# Patient Record
Sex: Female | Born: 1960
Health system: Southern US, Community
[De-identification: ages and names within clinical notes are randomized; demographics above are authoritative.]

## PROBLEM LIST (undated history)

## (undated) DIAGNOSIS — M797 Fibromyalgia: Secondary | ICD-10-CM

## (undated) DIAGNOSIS — K219 Gastro-esophageal reflux disease without esophagitis: Secondary | ICD-10-CM

## (undated) DIAGNOSIS — E039 Hypothyroidism, unspecified: Secondary | ICD-10-CM

## (undated) DIAGNOSIS — M1990 Unspecified osteoarthritis, unspecified site: Secondary | ICD-10-CM

## (undated) DIAGNOSIS — I493 Ventricular premature depolarization: Secondary | ICD-10-CM

## (undated) DIAGNOSIS — I341 Nonrheumatic mitral (valve) prolapse: Secondary | ICD-10-CM

## (undated) DIAGNOSIS — K2900 Acute gastritis without bleeding: Secondary | ICD-10-CM

## (undated) DIAGNOSIS — I7789 Other specified disorders of arteries and arterioles: Secondary | ICD-10-CM

## (undated) DIAGNOSIS — I10 Essential (primary) hypertension: Secondary | ICD-10-CM

## (undated) DIAGNOSIS — E785 Hyperlipidemia, unspecified: Secondary | ICD-10-CM

## (undated) DIAGNOSIS — J309 Allergic rhinitis, unspecified: Secondary | ICD-10-CM

## (undated) DIAGNOSIS — M3500 Sicca syndrome, unspecified: Secondary | ICD-10-CM

## (undated) DIAGNOSIS — D122 Benign neoplasm of ascending colon: Secondary | ICD-10-CM

## (undated) DIAGNOSIS — G43909 Migraine, unspecified, not intractable, without status migrainosus: Secondary | ICD-10-CM

## (undated) DIAGNOSIS — D120 Benign neoplasm of cecum: Secondary | ICD-10-CM

## (undated) DIAGNOSIS — R7303 Prediabetes: Secondary | ICD-10-CM

## (undated) HISTORY — DX: Benign neoplasm of cecum: D12.0

## (undated) HISTORY — DX: Allergic rhinitis, unspecified: J30.9

## (undated) HISTORY — PX: NEUROMA SURGERY: SHX722

## (undated) HISTORY — DX: Acute gastritis without bleeding: K29.00

## (undated) HISTORY — DX: Gastro-esophageal reflux disease without esophagitis: K21.9

## (undated) HISTORY — DX: Fibromyalgia: M79.7

## (undated) HISTORY — PX: TONSILLECTOMY: SUR1361

## (undated) HISTORY — PX: PLACEMENT OF BREAST IMPLANTS: SHX6334

## (undated) HISTORY — PX: NASAL SINUS SURGERY: SHX719

## (undated) HISTORY — DX: Benign neoplasm of ascending colon: D12.2

## (undated) HISTORY — DX: Hyperlipidemia, unspecified: E78.5

## (undated) HISTORY — DX: Unspecified osteoarthritis, unspecified site: M19.90

## (undated) HISTORY — DX: Essential (primary) hypertension: I10

## (undated) HISTORY — PX: FOOT SURGERY: SHX648

## (undated) HISTORY — DX: Sjogren syndrome, unspecified: M35.00

## (undated) HISTORY — DX: Hypothyroidism, unspecified: E03.9

## (undated) HISTORY — PX: GANGLION CYST EXCISION: SHX1691

---

## 1985-05-27 HISTORY — PX: AUGMENTATION MAMMAPLASTY: SUR837

## 1994-05-27 HISTORY — PX: PARTIAL HYSTERECTOMY: SHX80

## 1994-05-27 HISTORY — PX: TOTAL VAGINAL HYSTERECTOMY: SHX2548

## 2012-05-27 HISTORY — PX: COLONOSCOPY: SHX5424

## 2014-06-27 HISTORY — PX: REPLACEMENT TOTAL KNEE: SUR1224

## 2015-08-02 ENCOUNTER — Ambulatory Visit (INDEPENDENT_AMBULATORY_CARE_PROVIDER_SITE_OTHER): Payer: Medicare Other | Admitting: Internal Medicine

## 2015-08-02 ENCOUNTER — Encounter: Payer: Self-pay | Admitting: Internal Medicine

## 2015-08-02 VITALS — BP 112/78 | HR 80 | Ht 65.0 in | Wt 176.0 lb

## 2015-08-02 DIAGNOSIS — Z8742 Personal history of other diseases of the female genital tract: Secondary | ICD-10-CM | POA: Diagnosis not present

## 2015-08-02 DIAGNOSIS — J3089 Other allergic rhinitis: Secondary | ICD-10-CM | POA: Insufficient documentation

## 2015-08-02 DIAGNOSIS — M8580 Other specified disorders of bone density and structure, unspecified site: Secondary | ICD-10-CM | POA: Diagnosis not present

## 2015-08-02 DIAGNOSIS — I10 Essential (primary) hypertension: Secondary | ICD-10-CM | POA: Insufficient documentation

## 2015-08-02 DIAGNOSIS — E782 Mixed hyperlipidemia: Secondary | ICD-10-CM | POA: Insufficient documentation

## 2015-08-02 DIAGNOSIS — M1712 Unilateral primary osteoarthritis, left knee: Secondary | ICD-10-CM | POA: Insufficient documentation

## 2015-08-02 DIAGNOSIS — M797 Fibromyalgia: Secondary | ICD-10-CM | POA: Insufficient documentation

## 2015-08-02 DIAGNOSIS — M170 Bilateral primary osteoarthritis of knee: Secondary | ICD-10-CM

## 2015-08-02 DIAGNOSIS — K279 Peptic ulcer, site unspecified, unspecified as acute or chronic, without hemorrhage or perforation: Secondary | ICD-10-CM | POA: Diagnosis not present

## 2015-08-02 DIAGNOSIS — E034 Atrophy of thyroid (acquired): Secondary | ICD-10-CM | POA: Diagnosis not present

## 2015-08-02 DIAGNOSIS — L709 Acne, unspecified: Secondary | ICD-10-CM | POA: Diagnosis not present

## 2015-08-02 DIAGNOSIS — E038 Other specified hypothyroidism: Secondary | ICD-10-CM | POA: Diagnosis not present

## 2015-08-02 DIAGNOSIS — M3500 Sicca syndrome, unspecified: Secondary | ICD-10-CM | POA: Insufficient documentation

## 2015-08-02 HISTORY — DX: Personal history of other diseases of the female genital tract: Z87.42

## 2015-08-02 NOTE — Progress Notes (Signed)
Date:  08/02/2015   Name:  Ashley Glover   DOB:  1960-07-15   MRN:  [id number]  New patient who recently moved here from [place].  Needs to establish with PCP and be referred to Orthopedics.  Chief Complaint: New Patient (Initial Visit) and Hypertension Hypertension This is a chronic problem. The current episode started more than 1 year ago. The problem is unchanged. The problem is controlled. Associated symptoms include chest pain (chest wall pain). Pertinent negatives include no headaches, palpitations or shortness of breath. Past treatments include beta blockers and diuretics. The current treatment provides significant improvement. Hypertensive end-organ damage includes a thyroid problem.  Thyroid Problem Presents for follow-up visit. Patient reports no anxiety, cold intolerance, constipation, diarrhea, fatigue or palpitations. The symptoms have been stable. Past treatments include levothyroxine. Her past medical history is significant for hyperlipidemia.  Hyperlipidemia This is a chronic problem. This is a new diagnosis. The problem is controlled. Associated symptoms include chest pain (chest wall pain) and myalgias. Pertinent negatives include no shortness of breath. Current antihyperlipidemic treatment includes statins. The current treatment provides significant improvement of lipids. There are no compliance problems.   Fibromyalgia -This is a long-standing diagnosis.  Been seen by rheumatology and orthopedics in the past. Currently she takes tramadol for severe pain, Advil for more minor pain and gabapentin daily. She does exercise some with the recumbent bike but can't do more strenuous exercise due to her significant degenerative arthritis.  Degenerative OA and hypermobility - seen regularly by Orthopedics in [place].  She has had one knee replacement that needs to be revised and the other needs surgery as well. Currently takes hydrocodone as needed only. She is reluctant to have further surgery but  does need to establish with orthopedics. She brings extensive records from her previous ortho.  Peptic ulcer disease -  remote history of peptic ulcer. Maintained on daily PPI. She generally avoids nonsteroidals as much as possible. No recent symptoms to suggest a flare. No nausea vomiting or hematemesis.  Review of Systems  Constitutional: Negative for fever, chills, fatigue and unexpected weight change.  HENT: Positive for postnasal drip. Negative for sinus pressure and trouble swallowing.   Eyes: Negative for visual disturbance.  Respiratory: Negative for cough, chest tightness and shortness of breath.   Cardiovascular: Positive for chest pain (chest wall pain). Negative for palpitations and leg swelling.  Gastrointestinal: Negative for abdominal pain, diarrhea, constipation and blood in stool.  Endocrine: Negative for cold intolerance.  Genitourinary: Negative for dysuria and pelvic pain.  Musculoskeletal: Positive for myalgias, joint swelling, arthralgias and gait problem.  Skin: Positive for rash (initermittent acne lesions).  Neurological: Negative for syncope, weakness and headaches.  Psychiatric/Behavioral: Negative for sleep disturbance and dysphoric mood. The patient is not nervous/anxious.     Patient Active Problem List   Diagnosis Date Noted  . Essential hypertension 08/02/2015  . Hypothyroidism due to acquired atrophy of thyroid 08/02/2015  . Fibromyalgia 08/02/2015  . Sjogren's syndrome ([place]) 08/02/2015  . Peptic ulcer disease 08/02/2015  . Acne 08/02/2015  . Primary osteoarthritis of both knees 08/02/2015  . Osteopenia 08/02/2015  . Other allergic rhinitis 08/02/2015  . Hyperlipidemia, mixed 08/02/2015    Prior to Admission medications   Medication Sig Start Date End Date Taking? Authorizing Provider  furosemide (LASIX) 40 MG tablet Take 40 mg by mouth daily.   Yes Historical Provider, MD  gabapentin (NEURONTIN) 600 MG tablet Take 600 mg by mouth 3 (three) times  daily.   Yes  Historical Provider, MD  HYDROcodone-acetaminophen (NORCO/VICODIN) 5-325 MG tablet Take 1 tablet by mouth every 6 (six) hours as needed for moderate pain.   Yes Historical Provider, MD  hydrocortisone 2.5 % cream Apply topically 2 (two) times daily.   Yes Historical Provider, MD  levothyroxine (SYNTHROID, LEVOTHROID) 88 MCG tablet Take 88 mcg by mouth daily before breakfast.   Yes Historical Provider, MD  lovastatin (MEVACOR) 40 MG tablet Take 40 mg by mouth at bedtime.   Yes Historical Provider, MD  minocycline (DYNACIN) 100 MG tablet Take 100 mg by mouth 2 (two) times daily.   Yes Historical Provider, MD  montelukast (SINGULAIR) 10 MG tablet Take 10 mg by mouth at bedtime.   Yes Historical Provider, MD  pantoprazole (PROTONIX) 40 MG tablet Take 40 mg by mouth daily.   Yes Historical Provider, MD  propranolol (INDERAL) 40 MG tablet Take 40 mg by mouth 2 (two) times daily.   Yes Historical Provider, MD  sucralfate (CARAFATE) 1 g tablet Take 1 g by mouth 4 (four) times daily -  with meals and at bedtime.   Yes Historical Provider, MD  tiZANidine (ZANAFLEX) 4 MG capsule Take 4 mg by mouth 3 (three) times daily.   Yes Historical Provider, MD  traMADol (ULTRAM) 50 MG tablet Take 50 mg by mouth every 6 (six) hours as needed.   Yes Historical Provider, MD    Allergies  Allergen Reactions  . Morphine And Related Nausea Only  . Elwyn Reach Hcl]     Past Surgical History  Procedure Laterality Date  . Replacement total knee Right 06/2014  . Foot surgery Right   . Neuroma surgery Left   . Tonsillectomy    . Nasal sinus surgery    . Ganglion cyst excision Left   . Placement of breast implants    . Partial hysterectomy  1996    +HPV and cervical dysplasia    Social History  Substance Use Topics  . Smoking status: Never Smoker   . Smokeless tobacco: None  . Alcohol Use: No   Functional Status Survey: Is the patient deaf or have difficulty hearing?: No Does the patient  have difficulty seeing, even when wearing glasses/contacts?: No Does the patient have difficulty concentrating, remembering, or making decisions?: No Does the patient have difficulty walking or climbing stairs?: Yes Does the patient have difficulty dressing or bathing?: No Does the patient have difficulty doing errands alone such as visiting a doctor's office or shopping?: No    Medication list has been reviewed and updated.   Physical Exam  Constitutional: She is oriented to person, place, and time. She appears well-developed and well-nourished. No distress.  HENT:  Head: Normocephalic and atraumatic.  Neck: Normal range of motion. No thyromegaly present.  Cardiovascular: Normal rate, regular rhythm and normal heart sounds.   Pulmonary/Chest: Effort normal and breath sounds normal. No respiratory distress.  Musculoskeletal: She exhibits tenderness. She exhibits no edema.  Diffuse tenderness over upper back, chest, knees and elbows  Lymphadenopathy:    She has no cervical adenopathy.  Neurological: She is alert and oriented to person, place, and time.  Skin: Skin is warm and dry. No rash noted.  Psychiatric: She has a normal mood and affect. Her speech is normal and behavior is normal. Thought content normal.  Nursing note and vitals reviewed.   BP 112/78 mmHg  Pulse 80  Ht 5\' 5"  (1.651 m)  Wt 176 lb (79.833 kg)  BMI 29.29 kg/m2  Assessment and Plan: 1.  Essential hypertension controlled  2. Hypothyroidism due to acquired atrophy of thyroid supplemented  3. Fibromyalgia  on gabapentin, advil, zanaflex and tramadol as needed  4. Sjogren's syndrome ([place])  5. Peptic ulcer disease On PPI daily plus carafate as needed  6. Other allergic rhinitis On singulair  7. Osteopenia Hx of Evista therapy May be due for repeat DEXA  8. Primary osteoarthritis of both knees Continue advil, hydrocodone as needed - Ambulatory referral to Orthopedic Surgery  9. Acne, unspecified  acne type On minocycline daily   Halina Maidens, MD [facility]  08/02/2015

## 2015-08-21 DIAGNOSIS — M1712 Unilateral primary osteoarthritis, left knee: Secondary | ICD-10-CM | POA: Diagnosis not present

## 2015-08-21 DIAGNOSIS — M25461 Effusion, right knee: Secondary | ICD-10-CM | POA: Diagnosis not present

## 2015-08-21 DIAGNOSIS — Z96651 Presence of right artificial knee joint: Secondary | ICD-10-CM | POA: Diagnosis not present

## 2015-09-13 DIAGNOSIS — M1712 Unilateral primary osteoarthritis, left knee: Secondary | ICD-10-CM | POA: Diagnosis not present

## 2015-09-20 DIAGNOSIS — Z96651 Presence of right artificial knee joint: Secondary | ICD-10-CM | POA: Diagnosis not present

## 2015-09-20 DIAGNOSIS — M25461 Effusion, right knee: Secondary | ICD-10-CM | POA: Diagnosis not present

## 2015-09-20 DIAGNOSIS — M1712 Unilateral primary osteoarthritis, left knee: Secondary | ICD-10-CM | POA: Diagnosis not present

## 2015-09-28 DIAGNOSIS — Z96651 Presence of right artificial knee joint: Secondary | ICD-10-CM | POA: Diagnosis not present

## 2015-10-30 DIAGNOSIS — M7062 Trochanteric bursitis, left hip: Secondary | ICD-10-CM | POA: Diagnosis not present

## 2015-10-30 DIAGNOSIS — M19071 Primary osteoarthritis, right ankle and foot: Secondary | ICD-10-CM | POA: Diagnosis not present

## 2015-10-30 DIAGNOSIS — M19072 Primary osteoarthritis, left ankle and foot: Secondary | ICD-10-CM | POA: Diagnosis not present

## 2015-11-02 ENCOUNTER — Encounter: Payer: Self-pay | Admitting: Internal Medicine

## 2015-11-02 ENCOUNTER — Ambulatory Visit
Admission: RE | Admit: 2015-11-02 | Discharge: 2015-11-02 | Disposition: A | Discharge status: Home / Self Care | Payer: Medicare Other | Source: Ambulatory Visit | Attending: Internal Medicine | Admitting: Internal Medicine

## 2015-11-02 ENCOUNTER — Ambulatory Visit (INDEPENDENT_AMBULATORY_CARE_PROVIDER_SITE_OTHER): Payer: Medicare Other | Admitting: Internal Medicine

## 2015-11-02 ENCOUNTER — Ambulatory Visit: Payer: Medicare Other | Admitting: Internal Medicine

## 2015-11-02 VITALS — BP 122/89 | HR 64 | Resp 16 | Ht 66.0 in | Wt 177.4 lb

## 2015-11-02 DIAGNOSIS — E034 Atrophy of thyroid (acquired): Secondary | ICD-10-CM | POA: Diagnosis not present

## 2015-11-02 DIAGNOSIS — M3500 Sicca syndrome, unspecified: Secondary | ICD-10-CM | POA: Diagnosis not present

## 2015-11-02 DIAGNOSIS — E038 Other specified hypothyroidism: Secondary | ICD-10-CM | POA: Diagnosis not present

## 2015-11-02 DIAGNOSIS — R0602 Shortness of breath: Secondary | ICD-10-CM | POA: Insufficient documentation

## 2015-11-02 DIAGNOSIS — E782 Mixed hyperlipidemia: Secondary | ICD-10-CM

## 2015-11-02 DIAGNOSIS — I10 Essential (primary) hypertension: Secondary | ICD-10-CM | POA: Diagnosis not present

## 2015-11-02 MED ORDER — TIZANIDINE HCL 4 MG PO CAPS: 4.0000 mg | ORAL_CAPSULE | Freq: Three times a day (TID) | ORAL | Status: DC

## 2015-11-02 MED ORDER — HYDROCORTISONE 2.5 % EX CREA: TOPICAL_CREAM | Freq: Two times a day (BID) | CUTANEOUS | Status: DC

## 2015-11-02 MED ORDER — LOVASTATIN 40 MG PO TABS: 40.0000 mg | ORAL_TABLET | Freq: Every day | ORAL | Status: DC

## 2015-11-02 MED ORDER — MINOCYCLINE HCL 100 MG PO TABS: 100.0000 mg | ORAL_TABLET | Freq: Two times a day (BID) | ORAL | Status: DC

## 2015-11-02 MED ORDER — PROPRANOLOL HCL 40 MG PO TABS: 40.0000 mg | ORAL_TABLET | Freq: Two times a day (BID) | ORAL | Status: DC

## 2015-11-02 MED ORDER — LEVOTHYROXINE SODIUM 88 MCG PO TABS: 88.0000 ug | ORAL_TABLET | Freq: Every day | ORAL | Status: DC

## 2015-11-02 NOTE — Progress Notes (Signed)
Date:  11/02/2015   Name:  Ashley Glover   DOB:  Oct 03, 1960   MRN:  [id number]   Chief Complaint: Hypothyroidism; Shortness of Breath; and Hypertension Thyroid Problem Presents for follow-up visit. Patient reports no fatigue or palpitations. The symptoms have been stable. Past treatments include levothyroxine. There is no history of heart failure.  Hypertension Associated symptoms include chest pain (muscular sternal pain) and shortness of breath. Pertinent negatives include no headaches or palpitations. Hypertensive end-organ damage includes a thyroid problem. There is no history of heart failure.  Shortness of Breath This is a chronic problem. The problem occurs every several days. The problem has been unchanged. Associated symptoms include chest pain (muscular sternal pain). Pertinent negatives include no abdominal pain, fever, headaches, leg swelling, rash or wheezing. Exacerbated by: scoliosis and DDD. She has tried beta agonist inhalers for the symptoms. There is no history of CAD, chronic lung disease, COPD or a heart failure.  She only has SOB for a few minutes occasionally - it always occurs at rest, never with exertion.  She reports having a stress test ~2 yrs ago which was normal.  EKG was abnormal. OA knees - seen by [facility].  She has a loosening of her right TKR and needs revision.  Also will need left TKR once the right is addressed.  She wants to avoid surgery for now - perhaps in 6-8 months.   Review of Systems  Constitutional: Negative for fever, chills, fatigue and unexpected weight change.  HENT: Negative for tinnitus, trouble swallowing and voice change.   Respiratory: Positive for shortness of breath. Negative for cough, chest tightness and wheezing.   Cardiovascular: Positive for chest pain (muscular sternal pain). Negative for palpitations and leg swelling.  Gastrointestinal: Negative for abdominal pain.  Musculoskeletal: Positive for joint swelling, arthralgias  and gait problem.  Skin: Negative for rash.  Neurological: Negative for dizziness and headaches.  Hematological: Negative for adenopathy.  Psychiatric/Behavioral: Negative for sleep disturbance and dysphoric mood.    Patient Active Problem List   Diagnosis Date Noted  . Essential hypertension 08/02/2015  . Hypothyroidism due to acquired atrophy of thyroid 08/02/2015  . Fibromyalgia 08/02/2015  . Sjogren's syndrome ([place]) 08/02/2015  . Peptic ulcer disease 08/02/2015  . Acne 08/02/2015  . Primary osteoarthritis of both knees 08/02/2015  . Osteopenia 08/02/2015  . Other allergic rhinitis 08/02/2015  . Hyperlipidemia, mixed 08/02/2015  . Hx of abnormal cervical Pap smear 08/02/2015    Prior to Admission medications   Medication Sig Start Date End Date Taking? Authorizing Provider  butalbital-acetaminophen-caffeine (FIORICET WITH CODEINE) 50-325-40-30 MG capsule Take 1 capsule by mouth every 4 (four) hours as needed for headache.   Yes Historical Provider, MD  furosemide (LASIX) 40 MG tablet Take 40 mg by mouth daily.   Yes Historical Provider, MD  gabapentin (NEURONTIN) 600 MG tablet Take 600 mg by mouth 3 (three) times daily.   Yes Historical Provider, MD  HYDROcodone-acetaminophen (NORCO/VICODIN) 5-325 MG tablet Take 1 tablet by mouth every 6 (six) hours as needed for moderate pain.   Yes Historical Provider, MD  hydrocortisone 2.5 % cream Apply topically 2 (two) times daily.   Yes Historical Provider, MD  isometheptene-acetaminophen-dichloralphenazone (MIDRIN) 65-100-325 MG capsule Take 1 capsule by mouth 4 (four) times daily as needed for migraine. Maximum 5 capsules in 12 hours for migraine headaches, 8 capsules in 24 hours for tension headaches.   Yes Historical Provider, MD  levothyroxine (SYNTHROID, LEVOTHROID) 88 MCG tablet Take 88 mcg  by mouth daily before breakfast.   Yes Historical Provider, MD  lovastatin (MEVACOR) 40 MG tablet Take 40 mg by mouth at bedtime.   Yes Historical  Provider, MD  minocycline (DYNACIN) 100 MG tablet Take 100 mg by mouth 2 (two) times daily.   Yes Historical Provider, MD  montelukast (SINGULAIR) 10 MG tablet Take 10 mg by mouth at bedtime.   Yes Historical Provider, MD  pantoprazole (PROTONIX) 40 MG tablet Take 40 mg by mouth daily.   Yes Historical Provider, MD  propranolol (INDERAL) 40 MG tablet Take 40 mg by mouth 2 (two) times daily.   Yes Historical Provider, MD  sucralfate (CARAFATE) 1 g tablet Take 1 g by mouth 4 (four) times daily -  with meals and at bedtime.   Yes Historical Provider, MD  tiZANidine (ZANAFLEX) 4 MG capsule Take 4 mg by mouth 3 (three) times daily.   Yes Historical Provider, MD  traMADol (ULTRAM) 50 MG tablet Take 50 mg by mouth every 6 (six) hours as needed.   Yes Historical Provider, MD    Allergies  Allergen Reactions  . Morphine And Related Nausea Only  . Elwyn Reach Hcl]     Past Surgical History  Procedure Laterality Date  . Replacement total knee Right 06/2014  . Foot surgery Right   . Neuroma surgery Left   . Tonsillectomy    . Nasal sinus surgery    . Ganglion cyst excision Left   . Placement of breast implants    . Partial hysterectomy  1996    +HPV and cervical dysplasia    Social History  Substance Use Topics  . Smoking status: Never Smoker   . Smokeless tobacco: None  . Alcohol Use: No     Medication list has been reviewed and updated.   Physical Exam  Constitutional: She is oriented to person, place, and time. She appears well-developed. No distress.  HENT:  Head: Normocephalic and atraumatic.  Neck: Normal range of motion. Neck supple. No thyromegaly present.  Cardiovascular: Normal rate, regular rhythm and normal heart sounds.   Pulmonary/Chest: Effort normal and breath sounds normal. No respiratory distress. She has no wheezes. She has no rales.  Musculoskeletal: She exhibits tenderness. She exhibits no edema.  Neurological: She is alert and oriented to person,  place, and time.  Skin: Skin is warm and dry. No rash noted.  Psychiatric: She has a normal mood and affect. Her behavior is normal. Thought content normal.  Nursing note and vitals reviewed.   BP 122/89 mmHg  Pulse 64  Resp 16  Ht 5\' 6"  (1.676 m)  Wt 177 lb 6.4 oz (80.468 kg)  BMI 28.65 kg/m2  SpO2 98%  Assessment and Plan: 1. Essential hypertension Fair control - continue current therapy - Comprehensive metabolic panel - propranolol (INDERAL) 40 MG tablet; Take 1 tablet (40 mg total) by mouth 2 (two) times daily.  Dispense: 180 tablet; Refill: 3  2. Hypothyroidism due to acquired atrophy of thyroid supplemented - TSH - levothyroxine (SYNTHROID, LEVOTHROID) 88 MCG tablet; Take 1 tablet (88 mcg total) by mouth daily before breakfast.  Dispense: 90 tablet; Refill: 3  3. Sjogren's syndrome ([place]) stable  4. Hyperlipidemia, mixed On statin therapy - Lipid panel - lovastatin (MEVACOR) 40 MG tablet; Take 1 tablet (40 mg total) by mouth at bedtime.  Dispense: 90 tablet; Refill: 3  5. Shortness of breath at rest Consider Cardiology evaluation if worsening; if EKG is abnormal, will need clearance for knee surgery - CBC  with Differential/Platelet - DG Chest 2 View; Future   Halina Maidens, MD [facility]  11/02/2015

## 2015-11-03 LAB — TSH: TSH: 0.571 u[IU]/mL (ref 0.450–4.500)

## 2015-11-03 LAB — COMPREHENSIVE METABOLIC PANEL
ALT: 21 IU/L (ref 0–32)
AST: 16 IU/L (ref 0–40)
Albumin/Globulin Ratio: 1.7 (ref 1.2–2.2)
Albumin: 4.8 g/dL (ref 3.5–5.5)
Alkaline Phosphatase: 95 IU/L (ref 39–117)
BILIRUBIN TOTAL: 0.4 mg/dL (ref 0.0–1.2)
BUN/Creatinine Ratio: 32 — ABNORMAL HIGH (ref 9–23)
BUN: 25 mg/dL — AB (ref 6–24)
CHLORIDE: 98 mmol/L (ref 96–106)
CO2: 24 mmol/L (ref 18–29)
CREATININE: 0.79 mg/dL (ref 0.57–1.00)
Calcium: 9.6 mg/dL (ref 8.7–10.2)
GFR calc non Af Amer: 84 mL/min/{1.73_m2} (ref >59)
GFR, EST AFRICAN AMERICAN: 97 mL/min/{1.73_m2} (ref >59)
GLUCOSE: 92 mg/dL (ref 65–99)
Globulin, Total: 2.9 g/dL (ref 1.5–4.5)
Potassium: 5.2 mmol/L (ref 3.5–5.2)
Sodium: 141 mmol/L (ref 134–144)
TOTAL PROTEIN: 7.7 g/dL (ref 6.0–8.5)

## 2015-11-03 LAB — CBC WITH DIFFERENTIAL/PLATELET
Basophils Absolute: 0 x10E3/uL (ref 0.0–0.2)
Basos: 0 %
EOS (ABSOLUTE): 0 x10E3/uL (ref 0.0–0.4)
Eos: 0 %
Hematocrit: 42.3 % (ref 34.0–46.6)
Hemoglobin: 14.1 g/dL (ref 11.1–15.9)
Immature Grans (Abs): 0 x10E3/uL (ref 0.0–0.1)
Immature Granulocytes: 0 %
Lymphocytes Absolute: 1.7 x10E3/uL (ref 0.7–3.1)
Lymphs: 18 %
MCH: 28.7 pg (ref 26.6–33.0)
MCHC: 33.3 g/dL (ref 31.5–35.7)
MCV: 86 fL (ref 79–97)
Monocytes Absolute: 0.9 x10E3/uL (ref 0.1–0.9)
Monocytes: 10 %
Neutrophils Absolute: 7 x10E3/uL (ref 1.4–7.0)
Neutrophils: 72 %
Platelets: 265 x10E3/uL (ref 150–379)
RBC: 4.92 x10E6/uL (ref 3.77–5.28)
RDW: 13.7 % (ref 12.3–15.4)
WBC: 9.7 x10E3/uL (ref 3.4–10.8)

## 2015-11-03 LAB — LIPID PANEL
CHOLESTEROL TOTAL: 224 mg/dL — AB (ref 100–199)
Chol/HDL Ratio: 2.7 ratio units (ref 0.0–4.4)
HDL: 83 mg/dL (ref >39)
LDL Calculated: 107 mg/dL — ABNORMAL HIGH (ref 0–99)
TRIGLYCERIDES: 171 mg/dL — AB (ref 0–149)
VLDL CHOLESTEROL CAL: 34 mg/dL (ref 5–40)

## 2015-12-06 DIAGNOSIS — Z96651 Presence of right artificial knee joint: Secondary | ICD-10-CM | POA: Diagnosis not present

## 2015-12-06 DIAGNOSIS — M19072 Primary osteoarthritis, left ankle and foot: Secondary | ICD-10-CM | POA: Diagnosis not present

## 2016-01-15 DIAGNOSIS — Z96651 Presence of right artificial knee joint: Secondary | ICD-10-CM | POA: Diagnosis not present

## 2016-01-15 DIAGNOSIS — M1712 Unilateral primary osteoarthritis, left knee: Secondary | ICD-10-CM | POA: Diagnosis not present

## 2016-01-15 DIAGNOSIS — M19072 Primary osteoarthritis, left ankle and foot: Secondary | ICD-10-CM | POA: Diagnosis not present

## 2016-01-24 DIAGNOSIS — M2351 Chronic instability of knee, right knee: Secondary | ICD-10-CM | POA: Diagnosis not present

## 2016-01-31 ENCOUNTER — Ambulatory Visit (INDEPENDENT_AMBULATORY_CARE_PROVIDER_SITE_OTHER): Payer: Medicare Other | Admitting: Internal Medicine

## 2016-01-31 ENCOUNTER — Encounter: Payer: Self-pay | Admitting: Internal Medicine

## 2016-01-31 VITALS — BP 122/82 | HR 59 | Resp 16 | Ht 66.0 in | Wt 183.0 lb

## 2016-01-31 DIAGNOSIS — E038 Other specified hypothyroidism: Secondary | ICD-10-CM

## 2016-01-31 DIAGNOSIS — K279 Peptic ulcer, site unspecified, unspecified as acute or chronic, without hemorrhage or perforation: Secondary | ICD-10-CM

## 2016-01-31 DIAGNOSIS — E782 Mixed hyperlipidemia: Secondary | ICD-10-CM

## 2016-01-31 DIAGNOSIS — J3089 Other allergic rhinitis: Secondary | ICD-10-CM

## 2016-01-31 DIAGNOSIS — G43C0 Periodic headache syndromes in child or adult, not intractable: Secondary | ICD-10-CM | POA: Diagnosis not present

## 2016-01-31 DIAGNOSIS — L719 Rosacea, unspecified: Secondary | ICD-10-CM | POA: Diagnosis not present

## 2016-01-31 DIAGNOSIS — I10 Essential (primary) hypertension: Secondary | ICD-10-CM

## 2016-01-31 DIAGNOSIS — M170 Bilateral primary osteoarthritis of knee: Secondary | ICD-10-CM

## 2016-01-31 DIAGNOSIS — E034 Atrophy of thyroid (acquired): Secondary | ICD-10-CM

## 2016-01-31 DIAGNOSIS — Z1239 Encounter for other screening for malignant neoplasm of breast: Secondary | ICD-10-CM | POA: Diagnosis not present

## 2016-01-31 MED ORDER — MONTELUKAST SODIUM 10 MG PO TABS: 10.0000 mg | ORAL_TABLET | Freq: Every day | ORAL | 3 refills | Status: DC

## 2016-01-31 MED ORDER — TIZANIDINE HCL 4 MG PO TABS: 4.0000 mg | ORAL_TABLET | Freq: Three times a day (TID) | ORAL | 1 refills | Status: DC

## 2016-01-31 MED ORDER — FUROSEMIDE 40 MG PO TABS: 40.0000 mg | ORAL_TABLET | Freq: Every day | ORAL | 3 refills | Status: DC

## 2016-01-31 NOTE — Progress Notes (Signed)
Date:  01/31/2016   Name:  Ashley Glover   DOB:  1960-07-17   MRN:  [id number]   Chief Complaint: Hypertension (Needs all meds filled) Hypertension  This is a chronic problem. The current episode started more than 1 year ago. The problem is unchanged. The problem is controlled. Pertinent negatives include no chest pain, palpitations or shortness of breath. Past treatments include diuretics and beta blockers. Hypertensive end-organ damage includes a thyroid problem.  Thyroid Problem  Presents for follow-up visit. Symptoms include fatigue. Patient reports no hair loss, leg swelling, palpitations, weight gain or weight loss. The symptoms have been stable.   Allergic Rhinitis - on daily singulair.  Uses Flonase or Benedryl as needed.  Has been on immunotherapy injections in the past with questionable benefit.  Migraine Headache - she had mild headaches frequently for which she takes no medication.  If she has a severe migraine she takes either Fiorinal or Midrin.  Midrin is no longer covered so she is using them less frequently.  Rosacea - on minocycline daily for several years to control cysts.  However, no longer being covered by insurance so she needs a Dermatologist to recommend alternative therapy.  Review of Systems  Constitutional: Positive for fatigue. Negative for chills, fever, weight gain and weight loss.  HENT: Negative for hearing loss.   Eyes: Negative for visual disturbance.  Respiratory: Negative for cough, chest tightness, shortness of breath and wheezing.   Cardiovascular: Negative for chest pain, palpitations and leg swelling.  Gastrointestinal: Negative for abdominal pain and blood in stool.  Genitourinary: Negative for dysuria.  Musculoskeletal: Positive for arthralgias and myalgias.  Skin: Negative for rash.    Patient Active Problem List   Diagnosis Date Noted  . Shortness of breath at rest 11/02/2015  . Essential hypertension 08/02/2015  . Hypothyroidism due to  acquired atrophy of thyroid 08/02/2015  . Fibromyalgia 08/02/2015  . Sjogren's syndrome ([place]) 08/02/2015  . Peptic ulcer disease 08/02/2015  . Acne 08/02/2015  . Primary osteoarthritis of both knees 08/02/2015  . Osteopenia 08/02/2015  . Other allergic rhinitis 08/02/2015  . Hyperlipidemia, mixed 08/02/2015  . Hx of abnormal cervical Pap smear 08/02/2015    Prior to Admission medications   Medication Sig Start Date End Date Taking? Authorizing Provider  acetaminophen (TYLENOL) 500 MG tablet Take 1,000 mg by mouth every 8 (eight) hours as needed.   Yes Historical Provider, MD  butalbital-aspirin-caffeine-codeine (FIORINAL WITH CODEINE) 50-325-40-30 MG capsule Take 1 capsule by mouth every 4 (four) hours as needed for pain.   Yes Historical Provider, MD  furosemide (LASIX) 40 MG tablet Take 40 mg by mouth daily.   Yes Historical Provider, MD  gabapentin (NEURONTIN) 600 MG tablet Take 600 mg by mouth 3 (three) times daily.   Yes Historical Provider, MD  hydrocortisone 2.5 % cream Apply topically 2 (two) times daily. 11/02/15  Yes Glean Hess, MD  isometheptene-acetaminophen-dichloralphenazone (MIDRIN) [phone] MG capsule Take 1 capsule by mouth 4 (four) times daily as needed for migraine. Maximum 5 capsules in 12 hours for migraine headaches, 8 capsules in 24 hours for tension headaches.   Yes Historical Provider, MD  levothyroxine (SYNTHROID, LEVOTHROID) 88 MCG tablet Take 1 tablet (88 mcg total) by mouth daily before breakfast. 11/02/15  Yes Glean Hess, MD  lovastatin (MEVACOR) 40 MG tablet Take 1 tablet (40 mg total) by mouth at bedtime. 11/02/15  Yes Glean Hess, MD  minocycline (DYNACIN) 100 MG tablet Take 1 tablet (100 mg  total) by mouth 2 (two) times daily. 11/02/15  Yes Glean Hess, MD  montelukast (SINGULAIR) 10 MG tablet Take 10 mg by mouth at bedtime.   Yes Historical Provider, MD  pantoprazole (PROTONIX) 40 MG tablet Take 40 mg by mouth daily.   Yes Historical  Provider, MD  propranolol (INDERAL) 40 MG tablet Take 1 tablet (40 mg total) by mouth 2 (two) times daily. 11/02/15  Yes Glean Hess, MD  sucralfate (CARAFATE) 1 g tablet Take 1 g by mouth as needed.    Yes Historical Provider, MD  tiZANidine (ZANAFLEX) 4 MG tablet Take 4 mg by mouth 3 (three) times daily.   Yes Historical Provider, MD  traMADol (ULTRAM) 50 MG tablet Take 50 mg by mouth every 6 (six) hours as needed.   Yes Historical Provider, MD    Allergies  Allergen Reactions  . Morphine And Related Nausea Only  . Elwyn Reach Hcl]     Past Surgical History:  Procedure Laterality Date  . FOOT SURGERY Right   . GANGLION CYST EXCISION Left   . NASAL SINUS SURGERY    . NEUROMA SURGERY Left   . PARTIAL HYSTERECTOMY  1996   +HPV and cervical dysplasia  . PLACEMENT OF BREAST IMPLANTS    . REPLACEMENT TOTAL KNEE Right 06/2014  . TONSILLECTOMY      Social History  Substance Use Topics  . Smoking status: Never Smoker  . Smokeless tobacco: Never Used  . Alcohol use No     Medication list has been reviewed and updated.   Physical Exam  Constitutional: She is oriented to person, place, and time. She appears well-developed. No distress.  HENT:  Head: Normocephalic and atraumatic.  Neck: Normal range of motion. Neck supple. No thyromegaly present.  Cardiovascular: Normal rate, regular rhythm and normal heart sounds.   Pulmonary/Chest: Effort normal and breath sounds normal. No respiratory distress. She has no wheezes. She has no rales.  Musculoskeletal: She exhibits no edema.  Neurological: She is alert and oriented to person, place, and time.  Skin: Skin is warm and dry. No rash noted.  Psychiatric: She has a normal mood and affect. Her behavior is normal. Thought content normal.  Nursing note and vitals reviewed.   BP 122/82   Pulse (!) 59   Resp 16   Ht 5\' 6"  (1.676 m)   Wt 183 lb (83 kg)   SpO2 97%   BMI 29.54 kg/m   Assessment and Plan: 1. Essential  hypertension controlled - furosemide (LASIX) 40 MG tablet; Take 1 tablet (40 mg total) by mouth daily.  Dispense: 90 tablet; Refill: 3  2. Other allergic rhinitis Stable; consider ENT if worsening - montelukast (SINGULAIR) 10 MG tablet; Take 1 tablet (10 mg total) by mouth at bedtime.  Dispense: 90 tablet; Refill: 3  3. Peptic ulcer disease Continue daily PPI Will monitor for B12 and iron deficiency  4. Hypothyroidism due to acquired atrophy of thyroid supplemented  5. Hyperlipidemia, mixed On statin therapy  6. Breast cancer screening - MM DIGITAL SCREENING BILATERAL; Future  7. Periodic headache syndrome, not intractable Continue current medications PRN  8. Primary osteoarthritis of both knees Followed by Rosanne Gutting - Planning right revision - will need Cardiology clearance  9. Rosacea Consult Dermatology - Dr. Aubery Lapping recommended  Halina Maidens, MD [facility]  01/31/2016

## 2016-01-31 NOTE — Patient Instructions (Signed)
GI - Dr. [name] -   Dermatology - Dr. Marlane Hatcher  Cardiology - Dr. Serafina Royals [facility]) or [facility] Cardiology [facility])

## 2016-02-12 DIAGNOSIS — M1712 Unilateral primary osteoarthritis, left knee: Secondary | ICD-10-CM | POA: Diagnosis not present

## 2016-02-21 ENCOUNTER — Inpatient Hospital Stay
Admission: RE | Admit: 2016-02-21 | Discharge: 2016-02-21 | Disposition: A | Discharge status: Home / Self Care | Payer: Self-pay | Source: Ambulatory Visit | Attending: *Deleted | Admitting: *Deleted

## 2016-02-21 ENCOUNTER — Other Ambulatory Visit: Payer: Self-pay | Admitting: *Deleted

## 2016-02-21 DIAGNOSIS — Z9289 Personal history of other medical treatment: Secondary | ICD-10-CM

## 2016-03-05 ENCOUNTER — Other Ambulatory Visit: Payer: Self-pay | Admitting: Internal Medicine

## 2016-03-05 ENCOUNTER — Ambulatory Visit
Admission: RE | Admit: 2016-03-05 | Discharge: 2016-03-05 | Disposition: A | Discharge status: Home / Self Care | Payer: Medicare Other | Source: Ambulatory Visit | Attending: Internal Medicine | Admitting: Internal Medicine

## 2016-03-05 DIAGNOSIS — Z1231 Encounter for screening mammogram for malignant neoplasm of breast: Secondary | ICD-10-CM | POA: Diagnosis not present

## 2016-03-05 DIAGNOSIS — Z1239 Encounter for other screening for malignant neoplasm of breast: Secondary | ICD-10-CM

## 2016-03-06 ENCOUNTER — Other Ambulatory Visit: Payer: Self-pay | Admitting: *Deleted

## 2016-03-06 ENCOUNTER — Inpatient Hospital Stay
Admission: RE | Admit: 2016-03-06 | Discharge: 2016-03-06 | Disposition: A | Discharge status: Home / Self Care | Payer: Self-pay | Source: Ambulatory Visit | Attending: *Deleted | Admitting: *Deleted

## 2016-03-06 DIAGNOSIS — Z9289 Personal history of other medical treatment: Secondary | ICD-10-CM

## 2016-04-01 DIAGNOSIS — M7062 Trochanteric bursitis, left hip: Secondary | ICD-10-CM | POA: Diagnosis not present

## 2016-04-01 DIAGNOSIS — M7522 Bicipital tendinitis, left shoulder: Secondary | ICD-10-CM | POA: Diagnosis not present

## 2016-04-01 DIAGNOSIS — M1712 Unilateral primary osteoarthritis, left knee: Secondary | ICD-10-CM | POA: Diagnosis not present

## 2016-04-01 DIAGNOSIS — M7552 Bursitis of left shoulder: Secondary | ICD-10-CM | POA: Diagnosis not present

## 2016-04-24 ENCOUNTER — Ambulatory Visit (INDEPENDENT_AMBULATORY_CARE_PROVIDER_SITE_OTHER): Payer: Medicare Other | Admitting: Internal Medicine

## 2016-04-24 ENCOUNTER — Encounter: Payer: Self-pay | Admitting: Internal Medicine

## 2016-04-24 VITALS — BP 118/78 | HR 60 | Resp 16 | Ht 66.0 in | Wt 182.4 lb

## 2016-04-24 DIAGNOSIS — E782 Mixed hyperlipidemia: Secondary | ICD-10-CM

## 2016-04-24 DIAGNOSIS — M170 Bilateral primary osteoarthritis of knee: Secondary | ICD-10-CM | POA: Diagnosis not present

## 2016-04-24 DIAGNOSIS — G43C0 Periodic headache syndromes in child or adult, not intractable: Secondary | ICD-10-CM | POA: Diagnosis not present

## 2016-04-24 DIAGNOSIS — Z860100 Personal history of colon polyps, unspecified: Secondary | ICD-10-CM | POA: Insufficient documentation

## 2016-04-24 DIAGNOSIS — Z23 Encounter for immunization: Secondary | ICD-10-CM

## 2016-04-24 DIAGNOSIS — K279 Peptic ulcer, site unspecified, unspecified as acute or chronic, without hemorrhage or perforation: Secondary | ICD-10-CM | POA: Diagnosis not present

## 2016-04-24 DIAGNOSIS — I10 Essential (primary) hypertension: Secondary | ICD-10-CM | POA: Diagnosis not present

## 2016-04-24 DIAGNOSIS — M8580 Other specified disorders of bone density and structure, unspecified site: Secondary | ICD-10-CM

## 2016-04-24 MED ORDER — PROMETHAZINE HCL 25 MG PO TABS: 25.0000 mg | ORAL_TABLET | Freq: Three times a day (TID) | ORAL | 0 refills | Status: DC | PRN

## 2016-04-24 MED ORDER — BUTALBITAL-APAP-CAFF-COD 50-325-40-30 MG PO CAPS: 1.0000 | ORAL_CAPSULE | ORAL | 0 refills | Status: DC | PRN

## 2016-04-24 MED ORDER — PANTOPRAZOLE SODIUM 40 MG PO TBEC: 40.0000 mg | DELAYED_RELEASE_TABLET | Freq: Every day | ORAL | 3 refills | Status: DC

## 2016-04-24 NOTE — Progress Notes (Signed)
Date:  04/24/2016   Name:  Ashley Glover   DOB:  May 24, 1961   MRN:  [id number]   Chief Complaint: Hypertension Hypertension  This is a chronic problem. The problem is unchanged. The problem is controlled. Associated symptoms include headaches. Pertinent negatives include no chest pain or palpitations. Past treatments include diuretics and beta blockers. The current treatment provides significant improvement.  Gastroesophageal Reflux  She complains of abdominal pain (gerd) and heartburn. She reports no chest pain, no coughing or no wheezing. This is a chronic problem. The current episode started more than 1 year ago. The problem occurs frequently. Pertinent negatives include no fatigue. Risk factors include hiatal hernia. She has tried a PPI for the symptoms. Past procedures include an EGD. showed gastric ulcers and HH; no mention of esophagitis or Barrett's.  Migraine   This is a recurrent problem. The current episode started more than 1 year ago. The problem occurs intermittently. Associated symptoms include abdominal pain (gerd). Pertinent negatives include no coughing, dizziness, fever or seizures. She has tried ergotamines and oral narcotics (fioricet with codeine) for the symptoms. The treatment provided significant relief. Her past medical history is significant for hypertension and migraine headaches.   Osteopenia - [place].  She was on Evista for about 10 years but stopped it 4 years ago when it was no longer covered.  She feels she is due for another study.  OA knees - she will be needing TKR on right in the near future.  She has daily pain and is very limited in ambulation.  She wishes to put off surgery until 2018 and have several other expensive tests, etc done next year.  Colon Polyps -last colonoscopy was in 2014 with recommendations to repeat in 3-4 years.  She would like to have a referral. She is hoping to also get an EGD at the same  time.  Review of Systems  Constitutional: Negative for chills, fatigue and fever.  Eyes: Negative for visual disturbance.  Respiratory: Negative for cough, chest tightness and wheezing.   Cardiovascular: Negative for chest pain, palpitations and leg swelling.  Gastrointestinal: Positive for abdominal pain (gerd) and heartburn. Negative for blood in stool.  Genitourinary: Negative for dysuria.  Musculoskeletal: Positive for arthralgias and gait problem.  Neurological: Positive for headaches. Negative for dizziness, seizures and syncope.    Patient Active Problem List   Diagnosis Date Noted  . Periodic headache syndrome, not intractable 01/31/2016  . Rosacea, acne 01/31/2016  . Essential hypertension 08/02/2015  . Hypothyroidism due to acquired atrophy of thyroid 08/02/2015  . Fibromyalgia 08/02/2015  . Sjogren's syndrome ([place]) 08/02/2015  . Peptic ulcer disease 08/02/2015  . Primary osteoarthritis of both knees 08/02/2015  . Osteopenia 08/02/2015  . Other allergic rhinitis 08/02/2015  . Hyperlipidemia, mixed 08/02/2015  . Hx of abnormal cervical Pap smear 08/02/2015    Prior to Admission medications   Medication Sig Start Date End Date Taking? Authorizing Provider  acetaminophen (TYLENOL) 500 MG tablet Take 1,000 mg by mouth every 8 (eight) hours as needed.   Yes Historical Provider, MD  butalbital-aspirin-caffeine-codeine (FIORINAL WITH CODEINE) 50-325-40-30 MG capsule Take 1 capsule by mouth every 4 (four) hours as needed for pain.   Yes Historical Provider, MD  furosemide (LASIX) 40 MG tablet Take 1 tablet (40 mg total) by mouth daily. 01/31/16  Yes Glean Hess, MD  gabapentin (NEURONTIN) 600 MG tablet Take 600 mg by mouth 3 (three) times daily.  Yes Historical Provider, MD  hydrocortisone 2.5 % cream Apply topically 2 (two) times daily. 11/02/15  Yes Glean Hess, MD  isometheptene-acetaminophen-dichloralphenazone (MIDRIN) [phone] MG capsule Take 1 capsule by mouth 4  (four) times daily as needed for migraine. Maximum 5 capsules in 12 hours for migraine headaches, 8 capsules in 24 hours for tension headaches.   Yes Historical Provider, MD  levothyroxine (SYNTHROID, LEVOTHROID) 88 MCG tablet Take 1 tablet (88 mcg total) by mouth daily before breakfast. 11/02/15  Yes Glean Hess, MD  lovastatin (MEVACOR) 40 MG tablet Take 1 tablet (40 mg total) by mouth at bedtime. 11/02/15  Yes Glean Hess, MD  minocycline (DYNACIN) 100 MG tablet Take 1 tablet (100 mg total) by mouth 2 (two) times daily. 11/02/15  Yes Glean Hess, MD  montelukast (SINGULAIR) 10 MG tablet Take 1 tablet (10 mg total) by mouth at bedtime. 01/31/16  Yes Glean Hess, MD  pantoprazole (PROTONIX) 40 MG tablet Take 40 mg by mouth daily.   Yes Historical Provider, MD  propranolol (INDERAL) 40 MG tablet Take 1 tablet (40 mg total) by mouth 2 (two) times daily. 11/02/15  Yes Glean Hess, MD  sucralfate (CARAFATE) 1 g tablet Take 1 g by mouth as needed.    Yes Historical Provider, MD  tiZANidine (ZANAFLEX) 4 MG tablet Take 1 tablet (4 mg total) by mouth 3 (three) times daily. 01/31/16  Yes Glean Hess, MD  traMADol (ULTRAM) 50 MG tablet Take 50 mg by mouth every 6 (six) hours as needed.   Yes Historical Provider, MD    Allergies  Allergen Reactions  . Morphine And Related Nausea Only  . Elwyn Reach Hcl]     Past Surgical History:  Procedure Laterality Date  . ABDOMINAL HYSTERECTOMY    . AUGMENTATION MAMMAPLASTY Bilateral 1987  . FOOT SURGERY Right   . GANGLION CYST EXCISION Left   . NASAL SINUS SURGERY    . NEUROMA SURGERY Left   . PARTIAL HYSTERECTOMY  1996   +HPV and cervical dysplasia  . PLACEMENT OF BREAST IMPLANTS    . REPLACEMENT TOTAL KNEE Right 06/2014  . TONSILLECTOMY      Social History  Substance Use Topics  . Smoking status: Never Smoker  . Smokeless tobacco: Never Used  . Alcohol use No     Medication list has been reviewed and  updated.   Physical Exam  Constitutional: She is oriented to person, place, and time. She appears well-developed. No distress.  HENT:  Head: Normocephalic and atraumatic.  Neck: Normal range of motion. No thyromegaly present.  Cardiovascular: Normal rate, regular rhythm and normal heart sounds.   Pulmonary/Chest: Effort normal and breath sounds normal. No respiratory distress. She has no wheezes.  Musculoskeletal: She exhibits no edema.  Lymphadenopathy:    She has no cervical adenopathy.  Neurological: She is alert and oriented to person, place, and time.  Skin: Skin is warm and dry. No rash noted.  Psychiatric: She has a normal mood and affect. Her behavior is normal. Thought content normal. Cognition and memory are normal.  Nursing note and vitals reviewed.   BP 118/78   Pulse 60   Resp 16   Ht 5\' 6"  (1.676 m)   Wt 182 lb 6.4 oz (82.7 kg)   SpO2 98%   BMI 29.44 kg/m   Assessment and Plan: 1. Essential hypertension controlled  2. Primary osteoarthritis of both knees Follow up with Orthopedics for surgical scheduling in 2018  3.  Encounter for immunization - Flu Vaccine QUAD 36+ mos IM  4. Osteopenia, unspecified location Need daily calcium and vitamin D - DG Bone Density; Future  5. Hx of adenomatous colonic polyps - Ambulatory referral to Gastroenterology  6. Hyperlipidemia, mixed On statin therapy  7. Periodic headache syndrome, not intractable Intermittent - - promethazine (PHENERGAN) 25 MG tablet; Take 1 tablet (25 mg total) by mouth every 8 (eight) hours as needed for nausea or vomiting.  Dispense: 30 tablet; Refill: 0 - butalbital-acetaminophen-caffeine (FIORICET WITH CODEINE) 50-325-40-30 MG capsule; Take 1 capsule by mouth every 4 (four) hours as needed for headache.  Dispense: 30 capsule; Refill: 0  8. Peptic ulcer disease On daily PPI with some breakthrough sx - pantoprazole (PROTONIX) 40 MG tablet; Take 1 tablet (40 mg total) by mouth daily.   Dispense: 90 tablet; Refill: 3 - Ambulatory referral to Gastroenterology   Halina Maidens, MD [facility]  04/24/2016

## 2016-04-25 ENCOUNTER — Other Ambulatory Visit: Payer: Self-pay | Admitting: Internal Medicine

## 2016-04-25 DIAGNOSIS — G43C0 Periodic headache syndromes in child or adult, not intractable: Secondary | ICD-10-CM

## 2016-04-25 DIAGNOSIS — G43009 Migraine without aura, not intractable, without status migrainosus: Secondary | ICD-10-CM

## 2016-04-25 MED ORDER — PROMETHAZINE HCL 25 MG PO TABS
25.0000 mg | ORAL_TABLET | Freq: Three times a day (TID) | ORAL | 0 refills | Status: DC | PRN
Start: 2016-04-25 — End: 2017-05-21

## 2016-04-30 ENCOUNTER — Telehealth: Payer: Self-pay

## 2016-04-30 NOTE — Telephone Encounter (Signed)
Refill Omeprazole 40 mg twice day 180 count to [facility]

## 2016-04-30 NOTE — Telephone Encounter (Signed)
Her chart says she is on Pantoprazole 40 mg once a day.  I sent that in at her visit #90 with 3 RF.

## 2016-05-01 ENCOUNTER — Ambulatory Visit: Payer: Medicare Other | Admitting: Internal Medicine

## 2016-05-14 ENCOUNTER — Telehealth: Payer: Self-pay | Admitting: Internal Medicine

## 2016-05-15 NOTE — Telephone Encounter (Signed)
Called pt left message to let her know Dr.Berglund is not going to prescribe Rx. Omeprazole if already taking Rx Pantopazole or she can take OTC Omeprazol until seeing GI in January

## 2016-05-28 ENCOUNTER — Ambulatory Visit (INDEPENDENT_AMBULATORY_CARE_PROVIDER_SITE_OTHER): Payer: Medicare Other | Admitting: Gastroenterology

## 2016-05-28 ENCOUNTER — Encounter: Payer: Self-pay | Admitting: Gastroenterology

## 2016-05-28 ENCOUNTER — Other Ambulatory Visit: Payer: Self-pay

## 2016-05-28 VITALS — BP 129/88 | HR 73 | Temp 98.2°F | Ht 66.0 in | Wt 194.5 lb

## 2016-05-28 DIAGNOSIS — K219 Gastro-esophageal reflux disease without esophagitis: Secondary | ICD-10-CM | POA: Diagnosis not present

## 2016-05-28 DIAGNOSIS — Z86010 Personal history of colonic polyps: Secondary | ICD-10-CM | POA: Diagnosis not present

## 2016-05-28 NOTE — Progress Notes (Signed)
Gastroenterology Consultation  Referring Provider:     Glean Hess, MD Primary Care Physician:  Halina Maidens, MD Primary Gastroenterologist:  Dr. Allen Norris     Reason for Consultation:     GERD and history of polyps        HPI:   Ashley Glover is a 56 y.o. y/o female referred for consultation & management of GERD and history of polyps by Dr. Halina Maidens, MD.  This patient comes today after having long-standing heartburn. The patient takes Protonix on a daily basis. The patient was seen by her last gastrologist in [place] and was told at that time that she should take omeprazole when she has acid breakthrough on top of her Protonix. The patient states she would do that but had run out of the medication. The patient also reports that she was taking Maalox when she has heartburn to try to ease the acid reflux. The patient also has a history of aspiration from waking up in the middle night with acid in her mouth and aspirating it. She states that that required a hospital stay. The patient denies any dysphagia or nausea or vomiting. There is also no report of any black stools or bloody stools. She now reports that she has acid breakthrough quite frequently on her Protonix.  Past Medical History:  Diagnosis Date  . Allergic rhinitis   . Degenerative joint disease   . Fibromyalgia   . Hyperlipidemia   . Hypertension   . Hypothyroid   . Sjogrens syndrome [facility])     Past Surgical History:  Procedure Laterality Date  . ABDOMINAL HYSTERECTOMY    . AUGMENTATION MAMMAPLASTY Bilateral 1987  . COLONOSCOPY  05/2012   polyps  . FOOT SURGERY Right   . GANGLION CYST EXCISION Left   . NASAL SINUS SURGERY    . NEUROMA SURGERY Left   . PARTIAL HYSTERECTOMY  1996   +HPV and cervical dysplasia  . PLACEMENT OF BREAST IMPLANTS    . REPLACEMENT TOTAL KNEE Right 06/2014  . TONSILLECTOMY      Prior to Admission medications   Medication Sig Start Date End Date Taking? Authorizing Provider    acetaminophen (TYLENOL) 500 MG tablet Take 1,000 mg by mouth every 8 (eight) hours as needed.   Yes Historical Provider, MD  butalbital-acetaminophen-caffeine (FIORICET WITH CODEINE) 50-325-40-30 MG capsule Take 1 capsule by mouth every 4 (four) hours as needed for headache. 04/24/16  Yes Glean Hess, MD  furosemide (LASIX) 40 MG tablet Take 1 tablet (40 mg total) by mouth daily. 01/31/16  Yes Glean Hess, MD  gabapentin (NEURONTIN) 600 MG tablet Take 600 mg by mouth 3 (three) times daily.   Yes Historical Provider, MD  hydrocortisone 2.5 % cream Apply topically 2 (two) times daily. 11/02/15  Yes Glean Hess, MD  isometheptene-acetaminophen-dichloralphenazone (MIDRIN) [phone] MG capsule Take 1 capsule by mouth 4 (four) times daily as needed for migraine. Maximum 5 capsules in 12 hours for migraine headaches, 8 capsules in 24 hours for tension headaches.   Yes Historical Provider, MD  levothyroxine (SYNTHROID, LEVOTHROID) 88 MCG tablet Take 1 tablet (88 mcg total) by mouth daily before breakfast. 11/02/15  Yes Glean Hess, MD  lovastatin (MEVACOR) 40 MG tablet Take 1 tablet (40 mg total) by mouth at bedtime. 11/02/15  Yes Glean Hess, MD  montelukast (SINGULAIR) 10 MG tablet Take 1 tablet (10 mg total) by mouth at bedtime. 01/31/16  Yes Glean Hess, MD  omeprazole ([place])  40 MG capsule Take 40 mg by mouth daily.   Yes Historical Provider, MD  pantoprazole (PROTONIX) 40 MG tablet Take 1 tablet (40 mg total) by mouth daily. 04/24/16  Yes Glean Hess, MD  promethazine (PHENERGAN) 25 MG tablet Take 1 tablet (25 mg total) by mouth every 8 (eight) hours as needed for nausea or vomiting. 04/25/16  Yes Glean Hess, MD  propranolol (INDERAL) 40 MG tablet Take 1 tablet (40 mg total) by mouth 2 (two) times daily. 11/02/15  Yes Glean Hess, MD  tiZANidine (ZANAFLEX) 4 MG tablet Take 1 tablet (4 mg total) by mouth 3 (three) times daily. 01/31/16  Yes Glean Hess, MD   traMADol (ULTRAM) 50 MG tablet Take 50 mg by mouth every 6 (six) hours as needed.   Yes Historical Provider, MD  minocycline (DYNACIN) 100 MG tablet Take 1 tablet (100 mg total) by mouth 2 (two) times daily. Patient not taking: Reported on 05/28/2016 11/02/15   Glean Hess, MD  sucralfate (CARAFATE) 1 g tablet Take 1 g by mouth as needed.     Historical Provider, MD    Family History  Problem Relation Age of Onset  . Pulmonary embolism Mother   . Hypertension Mother   . AVM Father   . Hypertension Father   . Hypothyroidism Father   . Breast cancer Neg Hx      Social History  Substance Use Topics  . Smoking status: Never Smoker  . Smokeless tobacco: Never Used  . Alcohol use No    Allergies as of 05/28/2016 - Review Complete 05/28/2016  Allergen Reaction Noted  . Morphine and related Nausea Only 08/02/2015  . Savella [milnacipran hcl]  08/02/2015    Review of Systems:    All systems reviewed and negative except where noted in HPI.   Physical Exam:  BP 129/88   Pulse 73   Temp 98.2 F (36.8 C) (Oral)   Ht 5\' 6"  (1.676 m)   Wt 194 lb 8 oz (88.2 kg)   BMI 31.39 kg/m  No LMP recorded. Patient has had a hysterectomy. Psych:  Alert and cooperative. Normal mood and affect. General:   Alert,  Well-developed, well-nourished, pleasant and cooperative in NAD Head:  Normocephalic and atraumatic. Eyes:  Sclera clear, no icterus.   Conjunctiva pink. Ears:  Normal auditory acuity. Nose:  No deformity, discharge, or lesions. Mouth:  No deformity or lesions,oropharynx pink & moist. Neck:  Supple; no masses or thyromegaly. Lungs:  Respirations even and unlabored.  Clear throughout to auscultation.   No wheezes, crackles, or rhonchi. No acute distress. Heart:  Regular rate and rhythm; no murmurs, clicks, rubs, or gallops. Abdomen:  Normal bowel sounds.  No bruits.  Soft, non-tender and non-distended without masses, hepatosplenomegaly or hernias noted.  No guarding or rebound  tenderness.  Negative Carnett sign.   Rectal:  Deferred.  Msk:  Symmetrical with fried is changes in her hands.  Good, equal movement & strength bilaterally. Pulses:  Normal pulses noted. Extremities:  No clubbing or edema.  No cyanosis. Neurologic:  Alert and oriented x3;  grossly normal neurologically. Skin:  Intact without significant lesions or rashes.  No jaundice. Lymph Nodes:  No significant cervical adenopathy. Psych:  Alert and cooperative. Normal mood and affect.  Imaging Studies: No results found.  Assessment and Plan:   Ashley Glover is a 56 y.o. y/o female who comes in today with a history of long-standing heartburn with breakthrough acid reflux. The patient also  has a history of colon polyps. The patient will be started on Dexilant and has been given samples of this. She will also be set up for an EGD and colonoscopy due to long-standing heartburn and her history of colon polyps. The patient has been explained the plan and agrees with it. I have discussed risks & benefits which include, but are not limited to, bleeding, infection, perforation & drug reaction.  The patient agrees with this plan & written consent will be obtained.       [name], MD. [facility]   Note: This dictation was prepared with Dragon dictation along with smaller phrase technology. Any transcriptional errors that result from this process are unintentional.

## 2016-05-30 ENCOUNTER — Telehealth: Payer: Self-pay | Admitting: Gastroenterology

## 2016-05-30 NOTE — Telephone Encounter (Signed)
Colonoscopy patient called and asked for you

## 2016-06-03 ENCOUNTER — Encounter: Payer: Self-pay | Admitting: Internal Medicine

## 2016-06-03 ENCOUNTER — Ambulatory Visit (INDEPENDENT_AMBULATORY_CARE_PROVIDER_SITE_OTHER): Payer: Medicare Other | Admitting: Internal Medicine

## 2016-06-03 VITALS — BP 154/110 | HR 89 | Temp 97.6°F | Ht 65.0 in | Wt 192.0 lb

## 2016-06-03 DIAGNOSIS — J40 Bronchitis, not specified as acute or chronic: Secondary | ICD-10-CM

## 2016-06-03 DIAGNOSIS — H539 Unspecified visual disturbance: Secondary | ICD-10-CM | POA: Diagnosis not present

## 2016-06-03 MED ORDER — DOXYCYCLINE HYCLATE 100 MG PO TABS: 100.0000 mg | ORAL_TABLET | Freq: Two times a day (BID) | ORAL | 0 refills | Status: DC

## 2016-06-03 NOTE — Progress Notes (Signed)
Date:  06/03/2016   Name:  Ashley Glover   DOB:  1960-06-04   MRN:  [id number]   Chief Complaint: Cough (Pt stated coughing yellow mucus, sinus pressure, ear problem) Cough  This is a new problem. The current episode started in the past 7 days. The problem has been unchanged. The problem occurs every few minutes. The cough is productive of sputum. Associated symptoms include ear pain and rhinorrhea. Pertinent negatives include no chest pain, chills, ear congestion, fever, postnasal drip, sore throat, shortness of breath or wheezing. The symptoms are aggravated by exercise. She has tried OTC cough suppressant for the symptoms. The treatment provided mild relief.  She also took three days of doxycycline that was left over.  The last dose this AM.  Vision change - three years ago had flashing lights in her right eye to lateral gaze.  Eye MD checked her out and did not find a cause.  Now she has same sx on the left and she is concerned.   Review of Systems  Constitutional: Positive for fatigue. Negative for chills, diaphoresis and fever.  HENT: Positive for ear pain, rhinorrhea and sinus pressure. Negative for postnasal drip, sneezing, sore throat and trouble swallowing.   Eyes: Positive for visual disturbance.  Respiratory: Positive for cough. Negative for shortness of breath and wheezing.   Cardiovascular: Negative for chest pain and palpitations.    Patient Active Problem List   Diagnosis Date Noted  . Hx of adenomatous colonic polyps 04/24/2016  . Periodic headache syndrome, not intractable 01/31/2016  . Rosacea, acne 01/31/2016  . Essential hypertension 08/02/2015  . Hypothyroidism due to acquired atrophy of thyroid 08/02/2015  . Fibromyalgia 08/02/2015  . Sjogren's syndrome ([place]) 08/02/2015  . Peptic ulcer disease 08/02/2015  . Primary osteoarthritis of both knees 08/02/2015  . Osteopenia 08/02/2015  . Other allergic rhinitis 08/02/2015  . Hyperlipidemia, mixed 08/02/2015  . Hx  of abnormal cervical Pap smear 08/02/2015    Prior to Admission medications   Medication Sig Start Date End Date Taking? Authorizing Provider  acetaminophen (TYLENOL) 500 MG tablet Take 1,000 mg by mouth every 8 (eight) hours as needed.   Yes Historical Provider, MD  butalbital-acetaminophen-caffeine (FIORICET WITH CODEINE) 50-325-40-30 MG capsule Take 1 capsule by mouth every 4 (four) hours as needed for headache. 04/24/16  Yes Glean Hess, MD  furosemide (LASIX) 40 MG tablet Take 1 tablet (40 mg total) by mouth daily. 01/31/16  Yes Glean Hess, MD  gabapentin (NEURONTIN) 600 MG tablet Take 600 mg by mouth 3 (three) times daily.   Yes Historical Provider, MD  hydrocortisone 2.5 % cream Apply topically 2 (two) times daily. 11/02/15  Yes Glean Hess, MD  isometheptene-acetaminophen-dichloralphenazone (MIDRIN) [phone] MG capsule Take 1 capsule by mouth 4 (four) times daily as needed for migraine. Maximum 5 capsules in 12 hours for migraine headaches, 8 capsules in 24 hours for tension headaches.   Yes Historical Provider, MD  levothyroxine (SYNTHROID, LEVOTHROID) 88 MCG tablet Take 1 tablet (88 mcg total) by mouth daily before breakfast. 11/02/15  Yes Glean Hess, MD  lovastatin (MEVACOR) 40 MG tablet Take 1 tablet (40 mg total) by mouth at bedtime. 11/02/15  Yes Glean Hess, MD  montelukast (SINGULAIR) 10 MG tablet Take 1 tablet (10 mg total) by mouth at bedtime. 01/31/16  Yes Glean Hess, MD  omeprazole (PRILOSEC) 40 MG capsule Take 40 mg by mouth daily.   Yes Historical Provider, MD  pantoprazole (PROTONIX) 40  MG tablet Take 1 tablet (40 mg total) by mouth daily. 04/24/16  Yes Glean Hess, MD  promethazine (PHENERGAN) 25 MG tablet Take 1 tablet (25 mg total) by mouth every 8 (eight) hours as needed for nausea or vomiting. 04/25/16  Yes Glean Hess, MD  propranolol (INDERAL) 40 MG tablet Take 1 tablet (40 mg total) by mouth 2 (two) times daily. 11/02/15  Yes Glean Hess, MD  tiZANidine (ZANAFLEX) 4 MG tablet Take 1 tablet (4 mg total) by mouth 3 (three) times daily. 01/31/16  Yes Glean Hess, MD  traMADol (ULTRAM) 50 MG tablet Take 50 mg by mouth every 6 (six) hours as needed.   Yes Historical Provider, MD    Allergies  Allergen Reactions  . Morphine And Related Nausea Only  . Elwyn Reach Hcl]     Past Surgical History:  Procedure Laterality Date  . ABDOMINAL HYSTERECTOMY    . AUGMENTATION MAMMAPLASTY Bilateral 1987  . COLONOSCOPY  05/2012   polyps  . FOOT SURGERY Right   . GANGLION CYST EXCISION Left   . NASAL SINUS SURGERY    . NEUROMA SURGERY Left   . PARTIAL HYSTERECTOMY  1996   +HPV and cervical dysplasia  . PLACEMENT OF BREAST IMPLANTS    . REPLACEMENT TOTAL KNEE Right 06/2014  . TONSILLECTOMY      Social History  Substance Use Topics  . Smoking status: Never Smoker  . Smokeless tobacco: Never Used  . Alcohol use No     Medication list has been reviewed and updated.   Physical Exam  Constitutional: She is oriented to person, place, and time. She appears well-developed. No distress.  HENT:  Head: Normocephalic and atraumatic.  Eyes: EOM and lids are normal. Pupils are equal, round, and reactive to light.  Cardiovascular: Normal rate, regular rhythm and normal heart sounds.   Pulmonary/Chest: Effort normal. No respiratory distress. She has decreased breath sounds. She has no wheezes. She has no rhonchi.  Musculoskeletal: Normal range of motion.  Neurological: She is alert and oriented to person, place, and time.  Skin: Skin is warm and dry. No rash noted.  Psychiatric: She has a normal mood and affect. Her behavior is normal. Thought content normal.  Nursing note and vitals reviewed.   BP (!) 154/110   Pulse 89   Temp 97.6 F (36.4 C)   Ht 5\' 5"  (1.651 m)   Wt 192 lb (87.1 kg)   SpO2 98%   BMI 31.95 kg/m   Assessment and Plan: 1. Bronchitis Continue otc cough/cold liquid - doxycycline  (VIBRA-TABS) 100 MG tablet; Take 1 tablet (100 mg total) by mouth 2 (two) times daily.  Dispense: 20 tablet; Refill: 0  2. Change in vision - Ambulatory referral to Ophthalmology   Halina Maidens, MD [facility]  06/03/2016

## 2016-06-04 ENCOUNTER — Ambulatory Visit
Admission: RE | Admit: 2016-06-04 | Discharge: 2016-06-04 | Disposition: A | Discharge status: Home / Self Care | Payer: Medicare Other | Source: Ambulatory Visit | Attending: Internal Medicine | Admitting: Internal Medicine

## 2016-06-04 DIAGNOSIS — M8588 Other specified disorders of bone density and structure, other site: Secondary | ICD-10-CM | POA: Insufficient documentation

## 2016-06-04 DIAGNOSIS — M85851 Other specified disorders of bone density and structure, right thigh: Secondary | ICD-10-CM | POA: Diagnosis not present

## 2016-06-04 DIAGNOSIS — Z23 Encounter for immunization: Secondary | ICD-10-CM

## 2016-06-04 DIAGNOSIS — Z780 Asymptomatic menopausal state: Secondary | ICD-10-CM | POA: Insufficient documentation

## 2016-06-04 DIAGNOSIS — E2839 Other primary ovarian failure: Secondary | ICD-10-CM | POA: Insufficient documentation

## 2016-06-04 DIAGNOSIS — M8580 Other specified disorders of bone density and structure, unspecified site: Secondary | ICD-10-CM

## 2016-06-04 NOTE — Telephone Encounter (Signed)
Patient wants to go ahead and schedule a colonoscopy

## 2016-06-05 ENCOUNTER — Other Ambulatory Visit: Payer: Self-pay

## 2016-06-05 MED ORDER — PEG 3350-KCL-NABCB-NACL-NASULF 236 G PO SOLR: ORAL | 0 refills | Status: DC

## 2016-06-05 NOTE — Telephone Encounter (Signed)
Pt scheduled for colonoscopy and EGD on 06/20/16.

## 2016-06-07 ENCOUNTER — Telehealth: Payer: Self-pay

## 2016-06-07 NOTE — Telephone Encounter (Signed)
-----   Message from Ashley Glover sent at 06/07/2016 10:33 AM EST ----- The notification/prior authorization case information was transmitted on 06/07/2016 at 9:33 AM CST. The notification/prior authorization reference number is [id number]. Please print this page for your records.  Your Notification/Prior Authorization submission has been Approved and no further action is required for this request. Please note that it may take a few days for the procedure coverage status to be updated and viewable via the Notification/Prior Authorization Status transaction on this website. ----- Message ----- From: [name], [place]: 06/05/2016   4:41 PM To: Ashley Glover  Colonoscopy and EGD at [facility] on 06/20/16. Please precert for GERD [id number] and Hx of colon polyps Z86.010.

## 2016-06-10 ENCOUNTER — Ambulatory Visit: Payer: Medicare Other

## 2016-06-11 ENCOUNTER — Telehealth: Payer: Self-pay | Admitting: Gastroenterology

## 2016-06-11 DIAGNOSIS — H43812 Vitreous degeneration, left eye: Secondary | ICD-10-CM | POA: Diagnosis not present

## 2016-06-11 NOTE — Telephone Encounter (Signed)
Patient left a voice message that she was given samples of Dexilant. Her colonoscopy is on the 25th and wanted to get more samples until then. Please call today

## 2016-06-11 NOTE — Telephone Encounter (Signed)
Advised pt I currently have 30mg  only but she is welcomed to come by and pick samples of that up.

## 2016-06-14 ENCOUNTER — Telehealth: Payer: Self-pay | Admitting: Gastroenterology

## 2016-06-14 NOTE — Telephone Encounter (Signed)
Patient has a question regarding medication for her upcoming colonoscopy Monday.

## 2016-06-14 NOTE — Telephone Encounter (Signed)
Left vm for pt to return my call regarding medication questions.

## 2016-06-19 NOTE — Discharge Instructions (Signed)

## 2016-06-20 ENCOUNTER — Ambulatory Visit
Admission: RE | Admit: 2016-06-20 | Discharge: 2016-06-20 | Disposition: A | Discharge status: Home / Self Care | Payer: Medicare Other | Source: Ambulatory Visit | Attending: Gastroenterology | Admitting: Gastroenterology

## 2016-06-20 ENCOUNTER — Ambulatory Visit: Payer: Medicare Other | Admitting: Anesthesiology

## 2016-06-20 ENCOUNTER — Encounter
Admission: RE | Disposition: A | Discharge status: Home / Self Care | Payer: Self-pay | Source: Ambulatory Visit | Attending: Gastroenterology

## 2016-06-20 DIAGNOSIS — K210 Gastro-esophageal reflux disease with esophagitis: Secondary | ICD-10-CM | POA: Diagnosis not present

## 2016-06-20 DIAGNOSIS — D122 Benign neoplasm of ascending colon: Secondary | ICD-10-CM

## 2016-06-20 DIAGNOSIS — K635 Polyp of colon: Secondary | ICD-10-CM | POA: Insufficient documentation

## 2016-06-20 DIAGNOSIS — D120 Benign neoplasm of cecum: Secondary | ICD-10-CM | POA: Diagnosis not present

## 2016-06-20 DIAGNOSIS — B3781 Candidal esophagitis: Secondary | ICD-10-CM | POA: Insufficient documentation

## 2016-06-20 DIAGNOSIS — K5730 Diverticulosis of large intestine without perforation or abscess without bleeding: Secondary | ICD-10-CM | POA: Insufficient documentation

## 2016-06-20 DIAGNOSIS — E785 Hyperlipidemia, unspecified: Secondary | ICD-10-CM | POA: Insufficient documentation

## 2016-06-20 DIAGNOSIS — E039 Hypothyroidism, unspecified: Secondary | ICD-10-CM | POA: Diagnosis not present

## 2016-06-20 DIAGNOSIS — R12 Heartburn: Secondary | ICD-10-CM | POA: Diagnosis not present

## 2016-06-20 DIAGNOSIS — Z8719 Personal history of other diseases of the digestive system: Secondary | ICD-10-CM | POA: Diagnosis not present

## 2016-06-20 DIAGNOSIS — I341 Nonrheumatic mitral (valve) prolapse: Secondary | ICD-10-CM | POA: Insufficient documentation

## 2016-06-20 DIAGNOSIS — Z800 Family history of malignant neoplasm of digestive organs: Secondary | ICD-10-CM | POA: Diagnosis not present

## 2016-06-20 DIAGNOSIS — Z96651 Presence of right artificial knee joint: Secondary | ICD-10-CM | POA: Diagnosis not present

## 2016-06-20 DIAGNOSIS — Z1211 Encounter for screening for malignant neoplasm of colon: Secondary | ICD-10-CM | POA: Insufficient documentation

## 2016-06-20 DIAGNOSIS — Z86010 Personal history of colonic polyps: Secondary | ICD-10-CM

## 2016-06-20 DIAGNOSIS — K219 Gastro-esophageal reflux disease without esophagitis: Secondary | ICD-10-CM | POA: Diagnosis not present

## 2016-06-20 DIAGNOSIS — I10 Essential (primary) hypertension: Secondary | ICD-10-CM | POA: Insufficient documentation

## 2016-06-20 DIAGNOSIS — K449 Diaphragmatic hernia without obstruction or gangrene: Secondary | ICD-10-CM | POA: Insufficient documentation

## 2016-06-20 DIAGNOSIS — Z79899 Other long term (current) drug therapy: Secondary | ICD-10-CM | POA: Insufficient documentation

## 2016-06-20 HISTORY — PX: POLYPECTOMY: SHX5525

## 2016-06-20 HISTORY — PX: COLONOSCOPY WITH PROPOFOL: SHX5780

## 2016-06-20 HISTORY — PX: ESOPHAGOGASTRODUODENOSCOPY (EGD) WITH PROPOFOL: SHX5813

## 2016-06-20 HISTORY — DX: Nonrheumatic mitral (valve) prolapse: I34.1

## 2016-06-20 SURGERY — COLONOSCOPY WITH PROPOFOL
Anesthesia: Monitor Anesthesia Care | Wound class: Contaminated

## 2016-06-20 MED ORDER — PROPOFOL 10 MG/ML IV BOLUS
INTRAVENOUS | Status: DC | PRN
  Administered 2016-06-20: 30 mg via INTRAVENOUS
  Administered 2016-06-20 (×2): 50 mg via INTRAVENOUS
  Administered 2016-06-20: 30 mg via INTRAVENOUS
  Administered 2016-06-20: 100 mg via INTRAVENOUS
  Administered 2016-06-20 (×3): 30 mg via INTRAVENOUS

## 2016-06-20 MED ORDER — LIDOCAINE HCL (CARDIAC) 20 MG/ML IV SOLN
INTRAVENOUS | Status: DC | PRN
  Administered 2016-06-20: 50 mg via INTRAVENOUS

## 2016-06-20 MED ORDER — GLYCOPYRROLATE 0.2 MG/ML IJ SOLN
INTRAMUSCULAR | Status: DC | PRN
  Administered 2016-06-20: 0.1 mg via INTRAVENOUS

## 2016-06-20 MED ORDER — STERILE WATER FOR IRRIGATION IR SOLN
Status: DC | PRN
  Administered 2016-06-20: 08:00:00

## 2016-06-20 MED ORDER — LACTATED RINGERS IV SOLN
INTRAVENOUS | Status: DC
  Administered 2016-06-20: 08:00:00 via INTRAVENOUS

## 2016-06-20 SURGICAL SUPPLY — 35 items
BALLN DILATOR 10-12 8 (BALLOONS)
BALLN DILATOR 12-15 8 (BALLOONS)
BALLN DILATOR 15-18 8 (BALLOONS)
BALLN DILATOR CRE 0-12 8 (BALLOONS)
BALLN DILATOR ESOPH 8 10 CRE (MISCELLANEOUS) IMPLANT
BALLOON DILATOR 12-15 8 (BALLOONS) IMPLANT
BALLOON DILATOR 15-18 8 (BALLOONS) IMPLANT
BALLOON DILATOR CRE 0-12 8 (BALLOONS) IMPLANT
BLOCK BITE 60FR ADLT L/F GRN (MISCELLANEOUS) ×4 IMPLANT
CANISTER SUCT 1200ML W/VALVE (MISCELLANEOUS) ×4 IMPLANT
CLIP HMST 235XBRD CATH ROT (MISCELLANEOUS) IMPLANT
CLIP RESOLUTION 360 11X235 (MISCELLANEOUS)
FCP ESCP3.2XJMB 240X2.8X (MISCELLANEOUS)
FORCEPS BIOP RAD 4 LRG CAP 4 (CUTTING FORCEPS) ×4 IMPLANT
FORCEPS BIOP RJ4 240 W/NDL (MISCELLANEOUS)
FORCEPS ESCP3.2XJMB 240X2.8X (MISCELLANEOUS) IMPLANT
GOWN CVR UNV OPN BCK APRN NK (MISCELLANEOUS) ×4 IMPLANT
GOWN ISOL THUMB LOOP REG UNIV (MISCELLANEOUS) ×4
INJECTOR VARIJECT VIN23 (MISCELLANEOUS) IMPLANT
KIT DEFENDO VALVE AND CONN (KITS) IMPLANT
KIT ENDO PROCEDURE OLY (KITS) ×4 IMPLANT
MARKER SPOT ENDO TATTOO 5ML (MISCELLANEOUS) IMPLANT
PAD GROUND ADULT SPLIT (MISCELLANEOUS) IMPLANT
PROBE APC STR FIRE (PROBE) IMPLANT
RETRIEVER NET PLAT FOOD (MISCELLANEOUS) IMPLANT
RETRIEVER NET ROTH 2.5X230 LF (MISCELLANEOUS) IMPLANT
SNARE SHORT THROW 13M SML OVAL (MISCELLANEOUS) ×4 IMPLANT
SNARE SHORT THROW 30M LRG OVAL (MISCELLANEOUS) IMPLANT
SNARE SNG USE RND 15MM (INSTRUMENTS) IMPLANT
SPOT EX ENDOSCOPIC TATTOO (MISCELLANEOUS)
SYR INFLATION 60ML (SYRINGE) IMPLANT
TRAP ETRAP POLY (MISCELLANEOUS) ×4 IMPLANT
VARIJECT INJECTOR VIN23 (MISCELLANEOUS)
WATER STERILE IRR 250ML POUR (IV SOLUTION) ×4 IMPLANT
WIRE CRE 18-20MM 8CM F G (MISCELLANEOUS) IMPLANT

## 2016-06-20 NOTE — Anesthesia Postprocedure Evaluation (Signed)
Anesthesia Post Note  Patient: Ashley Glover  Procedure(s) Performed: Procedure(s) (LRB): COLONOSCOPY WITH PROPOFOL (N/A) ESOPHAGOGASTRODUODENOSCOPY (EGD) WITH PROPOFOL (N/A) POLYPECTOMY  Patient location during evaluation: PACU Anesthesia Type: MAC Level of consciousness: awake and alert Pain management: pain level controlled Vital Signs Assessment: post-procedure vital signs reviewed and stable Respiratory status: spontaneous breathing, nonlabored ventilation, respiratory function stable and patient connected to nasal cannula oxygen Cardiovascular status: stable and blood pressure returned to baseline Anesthetic complications: no    Alisa Graff

## 2016-06-20 NOTE — H&P (Signed)
Ashley Lame, MD [address] [place], [place] [address] Phone: [phone] Fax : [phone]  Primary Care Physician:  Halina Maidens, MD Primary Gastroenterologist:  Dr. Allen Norris  Pre-Procedure History & Physical: HPI:  Ashley Glover is a 56 y.o. female is here for an endoscopy and colonoscopy.   Past Medical History:  Diagnosis Date  . Allergic rhinitis   . Degenerative joint disease   . Fibromyalgia   . Hyperlipidemia   . Hypertension   . Hypothyroid   . Mitral valve prolapse    states it is mild and does not see a cardiologist  . Sjogrens syndrome [facility])     Past Surgical History:  Procedure Laterality Date  . ABDOMINAL HYSTERECTOMY    . AUGMENTATION MAMMAPLASTY Bilateral 1987  . COLONOSCOPY  05/2012   polyps  . FOOT SURGERY Right   . GANGLION CYST EXCISION Left   . NASAL SINUS SURGERY    . NEUROMA SURGERY Left   . PARTIAL HYSTERECTOMY  1996   +HPV and cervical dysplasia  . PLACEMENT OF BREAST IMPLANTS    . REPLACEMENT TOTAL KNEE Right 06/2014  . TONSILLECTOMY      Prior to Admission medications   Medication Sig Start Date End Date Taking? Authorizing Provider  acetaminophen (TYLENOL) 500 MG tablet Take 1,000 mg by mouth every 8 (eight) hours as needed.   Yes Historical Provider, MD  butalbital-acetaminophen-caffeine (FIORICET WITH CODEINE) 50-325-40-30 MG capsule Take 1 capsule by mouth every 4 (four) hours as needed for headache. 04/24/16  Yes Glean Hess, MD  Dexlansoprazole (DEXILANT) 30 MG capsule Take 30 mg by mouth daily.   Yes Historical Provider, MD  furosemide (LASIX) 40 MG tablet Take 1 tablet (40 mg total) by mouth daily. 01/31/16  Yes Glean Hess, MD  gabapentin (NEURONTIN) 600 MG tablet Take 600 mg by mouth 3 (three) times daily.   Yes Historical Provider, MD  hydrocortisone 2.5 % cream Apply topically 2 (two) times daily. 11/02/15  Yes Glean Hess, MD  isometheptene-acetaminophen-dichloralphenazone (MIDRIN) [phone] MG  capsule Take 1 capsule by mouth 4 (four) times daily as needed for migraine. Maximum 5 capsules in 12 hours for migraine headaches, 8 capsules in 24 hours for tension headaches.   Yes Historical Provider, MD  levothyroxine (SYNTHROID, LEVOTHROID) 88 MCG tablet Take 1 tablet (88 mcg total) by mouth daily before breakfast. 11/02/15  Yes Glean Hess, MD  lovastatin (MEVACOR) 40 MG tablet Take 1 tablet (40 mg total) by mouth at bedtime. 11/02/15  Yes Glean Hess, MD  montelukast (SINGULAIR) 10 MG tablet Take 1 tablet (10 mg total) by mouth at bedtime. 01/31/16  Yes Glean Hess, MD  polyethylene glycol (GOLYTELY) 236 g solution Drink one 8 oz glass every 20 mins until stools are clear. 06/05/16  Yes [place] Lame, MD  promethazine (PHENERGAN) 25 MG tablet Take 1 tablet (25 mg total) by mouth every 8 (eight) hours as needed for nausea or vomiting. 04/25/16  Yes Glean Hess, MD  propranolol (INDERAL) 40 MG tablet Take 1 tablet (40 mg total) by mouth 2 (two) times daily. 11/02/15  Yes Glean Hess, MD  tiZANidine (ZANAFLEX) 4 MG tablet Take 1 tablet (4 mg total) by mouth 3 (three) times daily. 01/31/16  Yes Glean Hess, MD  traMADol (ULTRAM) 50 MG tablet Take 50 mg by mouth every 6 (six) hours as needed.   Yes Historical Provider, MD  doxycycline (VIBRA-TABS) 100 MG tablet Take 1 tablet (100  mg total) by mouth 2 (two) times daily. Patient not taking: Reported on 06/12/2016 06/03/16   Glean Hess, MD  omeprazole (PRILOSEC) 40 MG capsule Take 40 mg by mouth daily.    Historical Provider, MD  pantoprazole (PROTONIX) 40 MG tablet Take 1 tablet (40 mg total) by mouth daily. Patient not taking: Reported on 06/12/2016 04/24/16   Glean Hess, MD    Allergies as of 06/05/2016 - Review Complete 06/03/2016  Allergen Reaction Noted  . Morphine and related Nausea Only 08/02/2015  . Savella [milnacipran hcl]  08/02/2015    Family History  Problem Relation Age of Onset  . Pulmonary embolism  Mother   . Hypertension Mother   . AVM Father   . Hypertension Father   . Hypothyroidism Father   . Breast cancer Neg Hx     Social History   Social History  . Marital status: Divorced    Spouse name: N/A  . Number of children: N/A  . Years of education: N/A   Occupational History  . disabled    Social History Main Topics  . Smoking status: Never Smoker  . Smokeless tobacco: Never Used  . Alcohol use No  . Drug use: No  . Sexual activity: Not on file   Other Topics Concern  . Not on file   Social History Narrative   Patient is on Soc Sec Disability.  She has been disabled for a number of years from Degenerative arthritis and Fibromyalgia.    Review of Systems: See HPI, otherwise negative ROS  Physical Exam: BP (!) 120/91   Pulse 79   Temp (!) 96.8 F (36 C) (Oral)   Resp 16   Ht 5\' 5"  (1.651 m)   Wt 186 lb (84.4 kg)   SpO2 100%   BMI 30.95 kg/m  General:   Alert,  pleasant and cooperative in NAD Head:  Normocephalic and atraumatic. Neck:  Supple; no masses or thyromegaly. Lungs:  Clear throughout to auscultation.    Heart:  Regular rate and rhythm. Abdomen:  Soft, nontender and nondistended. Normal bowel sounds, without guarding, and without rebound.   Neurologic:  Alert and  oriented x4;  grossly normal neurologically.  Impression/Plan: [name] is here for an endoscopy and colonoscopy to be performed for history of polyps and GERD  Risks, benefits, limitations, and alternatives regarding  endoscopy and colonoscopy have been reviewed with the patient.  Questions have been answered.  All parties agreeable.   [place] Lame, MD  06/20/2016, 7:49 AM

## 2016-06-20 NOTE — Op Note (Signed)
[facility] Gastroenterology Patient Name: [place] Glover Procedure Date: 06/20/2016 7:48 AM MRN: [id number] Account #: [id number] Date of Birth: March 01, 1961 Admit Type: Outpatient Age: 56 Room: [facility] OR ROOM 01 Gender: Female Note Status: Finalized Procedure:            Colonoscopy Indications:          High risk colon cancer surveillance: Personal history                        of colonic polyps Providers:            [name] MD, MD Medicines:            Propofol per Anesthesia Complications:        No immediate complications. Procedure:            Pre-Anesthesia Assessment:                       - Prior to the procedure, a History and Physical was                        performed, and patient medications and allergies were                        reviewed. The patient's tolerance of previous                        anesthesia was also reviewed. The risks and benefits of                        the procedure and the sedation options and risks were                        discussed with the patient. All questions were                        answered, and informed consent was obtained. Prior                        Anticoagulants: The patient has taken no previous                        anticoagulant or antiplatelet agents. ASA Grade                        Assessment: II - A patient with mild systemic disease.                        After reviewing the risks and benefits, the patient was                        deemed in satisfactory condition to undergo the                        procedure.                       After obtaining informed consent, the colonoscope was                        passed under direct vision. Throughout the  procedure,                        the patient's blood pressure, pulse, and oxygen                        saturations were monitored continuously. The Olympus                        Colonoscope [id number]) was introduced through the                   anus and advanced to the the cecum, identified by                        appendiceal orifice and ileocecal valve. The                        colonoscopy was performed without difficulty. The                        patient tolerated the procedure well. The quality of                        the bowel preparation was excellent. Findings:      The perianal and digital rectal examinations were normal.      A 3 mm polyp was found in the cecum. The polyp was sessile. The polyp       was removed with a cold snare. Resection and retrieval were complete.      A 10 mm polyp was found in the ascending colon. The polyp was sessile.       The polyp was removed with a cold snare. Resection and retrieval were       complete.      Multiple small-mouthed diverticula were found in the sigmoid colon. Impression:           - One 3 mm polyp in the cecum, removed with a cold                        snare. Resected and retrieved.                       - One 10 mm polyp in the ascending colon, removed with                        a cold snare. Resected and retrieved.                       - Diverticulosis in the sigmoid colon. Recommendation:       - Discharge patient to home.                       - Resume previous diet.                       - Continue present medications.                       - Await pathology results.                       - Repeat colonoscopy in 5  years for surveillance. Procedure Code(s):    --- Professional ---                       [phone], Colonoscopy, flexible; with removal of tumor(s),                        polyp(s), or other lesion(s) by snare technique Diagnosis Code(s):    --- Professional ---                       Z86.010, Personal history of colonic polyps                       D12.0, Benign neoplasm of cecum                       D12.2, Benign neoplasm of ascending colon CPT copyright 2016 American Medical Association. All rights reserved. The codes documented in  this report are preliminary and upon coder review may  be revised to meet current compliance requirements. [name] MD, MD 06/20/2016 8:31:45 AM This report has been signed electronically. Number of Addenda: 0 Note Initiated On: 06/20/2016 7:48 AM Scope Withdrawal Time: 0 hours 8 minutes 29 seconds  Total Procedure Duration: 0 hours 11 minutes 38 seconds       [facility]

## 2016-06-20 NOTE — Anesthesia Procedure Notes (Signed)
Procedure Name: MAC Date/Time: 06/20/2016 8:00 AM Performed by: Cameron Ali Pre-anesthesia Checklist: Patient identified, Emergency Drugs available, Suction available, Timeout performed and Patient being monitored Patient Re-evaluated:Patient Re-evaluated prior to inductionOxygen Delivery Method: Nasal cannula Placement Confirmation: positive ETCO2

## 2016-06-20 NOTE — Op Note (Signed)
[facility] Gastroenterology Patient Name: [place] Glover Procedure Date: 06/20/2016 7:49 AM MRN: [id number] Account #: [id number] Date of Birth: [birth year] Admit Type: Outpatient Age: 56 Room: [facility] OR ROOM 01 Gender: Female Note Status: Finalized Procedure:            Upper GI endoscopy Indications:          Heartburn Providers:            [name] MD, MD Referring MD:         Halina Maidens, MD (Referring MD) Medicines:            Propofol per Anesthesia Complications:        No immediate complications. Procedure:            Pre-Anesthesia Assessment:                       - Prior to the procedure, a History and Physical was                        performed, and patient medications and allergies were                        reviewed. The patient's tolerance of previous                        anesthesia was also reviewed. The risks and benefits of                        the procedure and the sedation options and risks were                        discussed with the patient. All questions were                        answered, and informed consent was obtained. Prior                        Anticoagulants: The patient has taken no previous                        anticoagulant or antiplatelet agents. ASA Grade                        Assessment: II - A patient with mild systemic disease.                        After reviewing the risks and benefits, the patient was                        deemed in satisfactory condition to undergo the                        procedure.                       After obtaining informed consent, the endoscope was                        passed under direct vision. Throughout the procedure,  the patient's blood pressure, pulse, and oxygen                        saturations were monitored continuously. The Olympus                        [id number]-[id number] Endoscope (S#. [id number]) was introduced                        through the  mouth, and advanced to the second part of                        duodenum. The upper GI endoscopy was accomplished                        without difficulty. The patient tolerated the procedure                        well. Findings:      Two biopsies were obtained with cold forceps for histology in the middle       third of the esophagus.      A small hiatal hernia was present.      The stomach was normal.      The examined duodenum was normal. Impression:           - Small hiatal hernia.                       - Normal stomach.                       - Normal examined duodenum.                       - Biopsy performed in the middle third of the esophagus. Recommendation:       - Discharge patient to home.                       - Resume previous diet.                       - Continue present medications.                       - Await pathology results.                       - Perform a colonoscopy. Procedure Code(s):    --- Professional ---                       [phone], Esophagogastroduodenoscopy, flexible, transoral;                        with biopsy, single or multiple Diagnosis Code(s):    --- Professional ---                       R12, Heartburn CPT copyright 2016 American Medical Association. All rights reserved. The codes documented in this report are preliminary and upon coder review may  be revised to meet current compliance requirements. [name] MD, MD 06/20/2016 8:11:43 AM This report has been signed electronically. Number of Addenda: 0 Note Initiated On: 06/20/2016  7:49 AM Total Procedure Duration: 0 hours 2 minutes 22 seconds       [facility]

## 2016-06-20 NOTE — Transfer of Care (Signed)
Immediate Anesthesia Transfer of Care Note  Patient: [place] Glover  Procedure(s) Performed: Procedure(s): COLONOSCOPY WITH PROPOFOL (N/A) ESOPHAGOGASTRODUODENOSCOPY (EGD) WITH PROPOFOL (N/A) POLYPECTOMY  Patient Location: PACU  Anesthesia Type: MAC  Level of Consciousness: awake, alert  and patient cooperative  Airway and Oxygen Therapy: Patient Spontanous Breathing and Patient connected to supplemental oxygen  Post-op Assessment: Post-op Vital signs reviewed, Patient's Cardiovascular Status Stable, Respiratory Function Stable, Patent Airway and No signs of Nausea or vomiting  Post-op Vital Signs: Reviewed and stable  Complications: No apparent anesthesia complications

## 2016-06-20 NOTE — Anesthesia Preprocedure Evaluation (Signed)
Anesthesia Evaluation  Patient identified by MRN, date of birth, ID band Patient awake    Reviewed: Allergy & Precautions, H&P , NPO status , Patient's Chart, lab work & pertinent test results, reviewed documented beta blocker date and time   Airway Mallampati: II  TM Distance: >3 FB Neck ROM: full    Dental no notable dental hx.    Pulmonary neg pulmonary ROS,    Pulmonary exam normal breath sounds clear to auscultation       Cardiovascular Exercise Tolerance: Good hypertension,  Rhythm:regular Rate:Normal     Neuro/Psych negative neurological ROS  negative psych ROS   GI/Hepatic Neg liver ROS, PUD,   Endo/Other  Hypothyroidism   Renal/GU negative Renal ROS  negative genitourinary   Musculoskeletal   Abdominal   Peds  Hematology negative hematology ROS (+)   Anesthesia Other Findings Sjogrens, Fibromyalgia  Reproductive/Obstetrics negative OB ROS                             Anesthesia Physical Anesthesia Plan  ASA: II  Anesthesia Plan: MAC   Post-op Pain Management:    Induction:   Airway Management Planned:   Additional Equipment:   Intra-op Plan:   Post-operative Plan:   Informed Consent: I have reviewed the patients History and Physical, chart, labs and discussed the procedure including the risks, benefits and alternatives for the proposed anesthesia with the patient or authorized representative who has indicated his/her understanding and acceptance.   Dental Advisory Given  Plan Discussed with: CRNA  Anesthesia Plan Comments:         Anesthesia Quick Evaluation

## 2016-06-21 ENCOUNTER — Encounter: Payer: Self-pay | Admitting: Gastroenterology

## 2016-06-25 ENCOUNTER — Encounter: Payer: Self-pay | Admitting: Gastroenterology

## 2016-06-25 DIAGNOSIS — M25571 Pain in right ankle and joints of right foot: Secondary | ICD-10-CM | POA: Diagnosis not present

## 2016-06-25 DIAGNOSIS — M79671 Pain in right foot: Secondary | ICD-10-CM | POA: Diagnosis not present

## 2016-06-25 DIAGNOSIS — M79672 Pain in left foot: Secondary | ICD-10-CM | POA: Diagnosis not present

## 2016-06-25 DIAGNOSIS — M2141 Flat foot [pes planus] (acquired), right foot: Secondary | ICD-10-CM | POA: Diagnosis not present

## 2016-06-25 DIAGNOSIS — M25572 Pain in left ankle and joints of left foot: Secondary | ICD-10-CM | POA: Diagnosis not present

## 2016-06-26 ENCOUNTER — Telehealth: Payer: Self-pay

## 2016-06-26 ENCOUNTER — Other Ambulatory Visit: Payer: Self-pay

## 2016-06-26 MED ORDER — FLUCONAZOLE 100 MG PO TABS: 100.0000 mg | ORAL_TABLET | Freq: Every day | ORAL | 0 refills | Status: DC

## 2016-06-26 NOTE — Telephone Encounter (Signed)
-----   Message from [name], MD sent at 06/26/2016  7:55 AM EST ----- Let the patient know the polyp was an adenoma and she needs another colonoscopy in 3 years. She also had a yeast infection in her esophagus and needs diflucan 100mg  qd for 2 weeks.

## 2016-06-26 NOTE — Telephone Encounter (Signed)
Pt notified of procedure results. Rx has been sent to [facility], [place] per pt request.

## 2016-07-14 ENCOUNTER — Other Ambulatory Visit: Payer: Self-pay | Admitting: Internal Medicine

## 2016-08-14 ENCOUNTER — Encounter: Payer: Self-pay | Admitting: Internal Medicine

## 2016-08-14 ENCOUNTER — Ambulatory Visit (INDEPENDENT_AMBULATORY_CARE_PROVIDER_SITE_OTHER): Payer: Medicare Other | Admitting: Internal Medicine

## 2016-08-14 VITALS — BP 122/84 | HR 72 | Temp 98.6°F | Ht 65.0 in | Wt 192.0 lb

## 2016-08-14 DIAGNOSIS — J0140 Acute pansinusitis, unspecified: Secondary | ICD-10-CM | POA: Diagnosis not present

## 2016-08-14 MED ORDER — AMOXICILLIN-POT CLAVULANATE 875-125 MG PO TABS: 1.0000 | ORAL_TABLET | Freq: Two times a day (BID) | ORAL | 0 refills | Status: DC

## 2016-08-14 NOTE — Progress Notes (Signed)
Date:  08/14/2016   Name:  [place] Glover   DOB:  1961/01/07   MRN:  [id number]   Chief Complaint: Sore Throat (Voice is completely gone. Headache. Low grade fever. ) and Sinusitis (Congested with green production. Cough is different than from having bronchitis in January.  ) Sinusitis  This is a new problem. The current episode started in the past 7 days. The maximum temperature recorded prior to her arrival was 100.4 - 100.9 F. The fever has been present for 1 to 2 days. Associated symptoms include congestion, a hoarse voice and sinus pressure. Pertinent negatives include no chills or shortness of breath. Past treatments include acetaminophen (antihistamines).      Review of Systems  Constitutional: Positive for fatigue. Negative for chills and fever.  HENT: Positive for congestion, hoarse voice and sinus pressure.   Respiratory: Negative for chest tightness, shortness of breath and wheezing.   Cardiovascular: Negative for chest pain.    Patient Active Problem List   Diagnosis Date Noted  . Benign neoplasm of ascending colon   . Benign neoplasm of cecum   . Heartburn   . Hx of colonic polyps 04/24/2016  . Periodic headache syndrome, not intractable 01/31/2016  . Rosacea, acne 01/31/2016  . Essential hypertension 08/02/2015  . Hypothyroidism due to acquired atrophy of thyroid 08/02/2015  . Fibromyalgia 08/02/2015  . Sjogren's syndrome ([place]) 08/02/2015  . Peptic ulcer disease 08/02/2015  . Primary osteoarthritis of both knees 08/02/2015  . Osteopenia 08/02/2015  . Other allergic rhinitis 08/02/2015  . Hyperlipidemia, mixed 08/02/2015  . Hx of abnormal cervical Pap smear 08/02/2015    Prior to Admission medications   Medication Sig Start Date End Date Taking? Authorizing Provider  acetaminophen (TYLENOL) 500 MG tablet Take 1,000 mg by mouth every 8 (eight) hours as needed.   Yes Historical Provider, MD  butalbital-acetaminophen-caffeine (FIORICET WITH CODEINE)  50-325-40-30 MG capsule Take 1 capsule by mouth every 4 (four) hours as needed for headache. 04/24/16  Yes Glean Hess, MD  Dexlansoprazole (DEXILANT) 30 MG capsule Take 30 mg by mouth daily.   Yes Historical Provider, MD  furosemide (LASIX) 40 MG tablet Take 1 tablet (40 mg total) by mouth daily. 01/31/16  Yes Glean Hess, MD  gabapentin (NEURONTIN) 600 MG tablet Take 600 mg by mouth 3 (three) times daily.   Yes Historical Provider, MD  hydrocortisone 2.5 % cream Apply topically 2 (two) times daily. 11/02/15  Yes Glean Hess, MD  isometheptene-acetaminophen-dichloralphenazone (MIDRIN) [phone] MG capsule Take 1 capsule by mouth 4 (four) times daily as needed for migraine. Maximum 5 capsules in 12 hours for migraine headaches, 8 capsules in 24 hours for tension headaches.   Yes Historical Provider, MD  levothyroxine (SYNTHROID, LEVOTHROID) 88 MCG tablet Take 1 tablet (88 mcg total) by mouth daily before breakfast. 11/02/15  Yes Glean Hess, MD  lovastatin (MEVACOR) 40 MG tablet Take 1 tablet (40 mg total) by mouth at bedtime. 11/02/15  Yes Glean Hess, MD  montelukast (SINGULAIR) 10 MG tablet Take 1 tablet (10 mg total) by mouth at bedtime. 01/31/16  Yes Glean Hess, MD  omeprazole (PRILOSEC) 40 MG capsule Take 40 mg by mouth daily.   Yes Historical Provider, MD  pantoprazole (PROTONIX) 40 MG tablet Take 1 tablet (40 mg total) by mouth daily. 04/24/16  Yes Glean Hess, MD  polyethylene glycol (GOLYTELY) 236 g solution Drink one 8 oz glass every 20 mins until stools are clear. 06/05/16  Yes [name], MD  promethazine (PHENERGAN) 25 MG tablet Take 1 tablet (25 mg total) by mouth every 8 (eight) hours as needed for nausea or vomiting. 04/25/16  Yes Glean Hess, MD  propranolol (INDERAL) 40 MG tablet Take 1 tablet (40 mg total) by mouth 2 (two) times daily. 11/02/15  Yes Glean Hess, MD  tiZANidine (ZANAFLEX) 4 MG tablet TAKE 1 TABLET BY MOUTH 3  TIMES DAILY 07/15/16   Yes Glean Hess, MD  traMADol (ULTRAM) 50 MG tablet Take 50 mg by mouth every 6 (six) hours as needed.   Yes Historical Provider, MD    Allergies  Allergen Reactions  . Morphine And Related Nausea Only  . Elwyn Reach Hcl]     Past Surgical History:  Procedure Laterality Date  . ABDOMINAL HYSTERECTOMY    . AUGMENTATION MAMMAPLASTY Bilateral 1987  . COLONOSCOPY  05/2012   polyps  . COLONOSCOPY WITH PROPOFOL N/A 06/20/2016   Procedure: COLONOSCOPY WITH PROPOFOL;  Surgeon: [name], MD;  Location: [place];  Service: Endoscopy;  Laterality: N/A;  . ESOPHAGOGASTRODUODENOSCOPY (EGD) WITH PROPOFOL N/A 06/20/2016   Procedure: ESOPHAGOGASTRODUODENOSCOPY (EGD) WITH PROPOFOL;  Surgeon: [name], MD;  Location: [place];  Service: Endoscopy;  Laterality: N/A;  . FOOT SURGERY Right   . GANGLION CYST EXCISION Left   . NASAL SINUS SURGERY    . NEUROMA SURGERY Left   . PARTIAL HYSTERECTOMY  1996   +HPV and cervical dysplasia  . PLACEMENT OF BREAST IMPLANTS    . POLYPECTOMY  06/20/2016   Procedure: POLYPECTOMY;  Surgeon: [name], MD;  Location: [place];  Service: Endoscopy;;  . REPLACEMENT TOTAL KNEE Right 06/2014  . TONSILLECTOMY      Social History  Substance Use Topics  . Smoking status: Never Smoker  . Smokeless tobacco: Never Used  . Alcohol use No     Medication list has been reviewed and updated.   Physical Exam  Constitutional: She is oriented to person, place, and time. She appears well-developed and well-nourished.  HENT:  Right Ear: External ear and ear canal normal. Tympanic membrane is not erythematous and not retracted.  Left Ear: External ear and ear canal normal. Tympanic membrane is not erythematous and not retracted.  Nose: Right sinus exhibits maxillary sinus tenderness and frontal sinus tenderness. Left sinus exhibits maxillary sinus tenderness and frontal sinus tenderness.  Mouth/Throat: Uvula is midline and  mucous membranes are normal. No oral lesions. Posterior oropharyngeal erythema present. No oropharyngeal exudate.  Cardiovascular: Normal rate, regular rhythm and normal heart sounds.   Pulmonary/Chest: Breath sounds normal. She has no wheezes. She has no rales.  Lymphadenopathy:    She has no cervical adenopathy.  Neurological: She is alert and oriented to person, place, and time.    BP 122/84 (BP Location: Right Arm, Patient Position: Sitting, Cuff Size: Large)   Pulse 72   Ht 5\' 5"  (1.651 m)   Wt 192 lb (87.1 kg)   SpO2 97%   BMI 31.95 kg/m   Assessment and Plan: 1. Acute non-recurrent pansinusitis Continue Flonase, Benedryl, Neti pot rinses and singulair   Meds ordered this encounter  Medications  . amoxicillin-clavulanate (AUGMENTIN) 875-125 MG tablet    Sig: Take 1 tablet by mouth 2 (two) times daily.    Dispense:  20 tablet    Refill:  0    Halina Maidens, MD [facility]  08/14/2016

## 2016-08-20 DIAGNOSIS — Z7689 Persons encountering health services in other specified circumstances: Secondary | ICD-10-CM | POA: Diagnosis not present

## 2016-08-20 DIAGNOSIS — I10 Essential (primary) hypertension: Secondary | ICD-10-CM | POA: Diagnosis not present

## 2016-08-20 DIAGNOSIS — I208 Other forms of angina pectoris: Secondary | ICD-10-CM | POA: Diagnosis not present

## 2016-08-20 DIAGNOSIS — I493 Ventricular premature depolarization: Secondary | ICD-10-CM | POA: Diagnosis not present

## 2016-08-29 ENCOUNTER — Other Ambulatory Visit: Payer: Self-pay

## 2016-09-17 ENCOUNTER — Other Ambulatory Visit: Payer: Self-pay | Admitting: Internal Medicine

## 2016-09-17 DIAGNOSIS — I10 Essential (primary) hypertension: Secondary | ICD-10-CM

## 2016-09-17 DIAGNOSIS — E034 Atrophy of thyroid (acquired): Secondary | ICD-10-CM

## 2016-09-17 DIAGNOSIS — E782 Mixed hyperlipidemia: Secondary | ICD-10-CM

## 2016-09-17 NOTE — Telephone Encounter (Signed)
Spoke to pt about OV and fasting labs in July. [place] Glover she will call back and may schedule for sooner than July due to Knee replacement being done in June.

## 2016-10-08 DIAGNOSIS — R262 Difficulty in walking, not elsewhere classified: Secondary | ICD-10-CM | POA: Diagnosis not present

## 2016-10-08 DIAGNOSIS — M65879 Other synovitis and tenosynovitis, unspecified ankle and foot: Secondary | ICD-10-CM | POA: Diagnosis not present

## 2016-10-08 DIAGNOSIS — M79672 Pain in left foot: Secondary | ICD-10-CM | POA: Diagnosis not present

## 2016-10-08 DIAGNOSIS — M67969 Unspecified disorder of synovium and tendon, unspecified lower leg: Secondary | ICD-10-CM | POA: Diagnosis not present

## 2016-10-16 DIAGNOSIS — M25562 Pain in left knee: Secondary | ICD-10-CM | POA: Diagnosis not present

## 2016-10-16 DIAGNOSIS — M25561 Pain in right knee: Secondary | ICD-10-CM | POA: Diagnosis not present

## 2016-10-29 ENCOUNTER — Other Ambulatory Visit: Payer: Self-pay | Admitting: Internal Medicine

## 2016-10-30 ENCOUNTER — Encounter: Payer: Self-pay | Admitting: Internal Medicine

## 2016-10-30 ENCOUNTER — Ambulatory Visit (INDEPENDENT_AMBULATORY_CARE_PROVIDER_SITE_OTHER): Payer: Medicare Other | Admitting: Internal Medicine

## 2016-10-30 VITALS — BP 108/64 | HR 57 | Ht 65.0 in | Wt 182.0 lb

## 2016-10-30 DIAGNOSIS — E034 Atrophy of thyroid (acquired): Secondary | ICD-10-CM

## 2016-10-30 DIAGNOSIS — I10 Essential (primary) hypertension: Secondary | ICD-10-CM

## 2016-10-30 DIAGNOSIS — M797 Fibromyalgia: Secondary | ICD-10-CM

## 2016-10-30 DIAGNOSIS — E782 Mixed hyperlipidemia: Secondary | ICD-10-CM | POA: Diagnosis not present

## 2016-10-30 DIAGNOSIS — K279 Peptic ulcer, site unspecified, unspecified as acute or chronic, without hemorrhage or perforation: Secondary | ICD-10-CM

## 2016-10-30 DIAGNOSIS — J3089 Other allergic rhinitis: Secondary | ICD-10-CM

## 2016-10-30 MED ORDER — TIZANIDINE HCL 4 MG PO TABS: 4.0000 mg | ORAL_TABLET | Freq: Three times a day (TID) | ORAL | 3 refills | Status: DC

## 2016-10-30 MED ORDER — LEVOTHYROXINE SODIUM 88 MCG PO TABS: ORAL_TABLET | ORAL | 3 refills | Status: DC

## 2016-10-30 MED ORDER — OMEPRAZOLE 40 MG PO CPDR: 40.0000 mg | DELAYED_RELEASE_CAPSULE | Freq: Every day | ORAL | 3 refills | Status: DC

## 2016-10-30 MED ORDER — LOVASTATIN 40 MG PO TABS: 40.0000 mg | ORAL_TABLET | Freq: Every day | ORAL | 3 refills | Status: DC

## 2016-10-30 MED ORDER — PROPRANOLOL HCL 40 MG PO TABS: 40.0000 mg | ORAL_TABLET | Freq: Two times a day (BID) | ORAL | 3 refills | Status: DC

## 2016-10-30 MED ORDER — MONTELUKAST SODIUM 10 MG PO TABS: 10.0000 mg | ORAL_TABLET | Freq: Every day | ORAL | 3 refills | Status: DC

## 2016-10-30 NOTE — Progress Notes (Signed)
Date:  10/30/2016   Name:  Ashley Glover   DOB:  1961/01/29   MRN:  [id number]   Chief Complaint: Gastroesophageal Reflux (Pantoprozole is not helping. Would like to be put back on Omeprazole. ); Allergic Rhinitis  (Needs singulair refilled. ); Hypothyroidism (Needs levothyroxine refilled. ); Hyperlipidemia (Needs lovastatin refilled.); and Spasms (Needs refill on Tizanadine. ) Gastroesophageal Reflux  She complains of abdominal pain (gerd) and heartburn. She reports no chest pain, no coughing or no wheezing. This is a recurrent problem. The problem occurs frequently. Pertinent negatives include no fatigue. She has tried a PPI for the symptoms. The treatment provided moderate relief.  Hyperlipidemia  This is a chronic problem. Pertinent negatives include no chest pain. Current antihyperlipidemic treatment includes statins.  Thyroid Problem  Presents for follow-up visit. Patient reports no fatigue or palpitations. The symptoms have been stable. Her past medical history is significant for hyperlipidemia.  Knee Pain   The pain is present in the right thigh (planning revision surgery next week). The pain is moderate. The pain has been worsening since onset. Associated symptoms include an inability to bear weight.   Lab Results  Component Value Date   CHOL 224 (H) 11/02/2015   HDL 83 11/02/2015   LDLCALC 107 (H) 11/02/2015   TRIG 171 (H) 11/02/2015   CHOLHDL 2.7 11/02/2015   Lab Results  Component Value Date   TSH 0.571 11/02/2015     Review of Systems  Constitutional: Negative for chills, fatigue and fever.  Eyes: Negative for visual disturbance.  Respiratory: Negative for cough, chest tightness and wheezing.   Cardiovascular: Negative for chest pain, palpitations and leg swelling.  Gastrointestinal: Positive for abdominal pain (gerd) and heartburn. Negative for blood in stool.  Genitourinary: Negative for dysuria.  Musculoskeletal: Positive for arthralgias and gait problem.    Neurological: Positive for headaches. Negative for dizziness, seizures and syncope.    Patient Active Problem List   Diagnosis Date Noted  . Frequent PVCs 08/20/2016  . Benign neoplasm of ascending colon   . Benign neoplasm of cecum   . Heartburn   . Hx of colonic polyps 04/24/2016  . Periodic headache syndrome, not intractable 01/31/2016  . Rosacea, acne 01/31/2016  . Essential hypertension 08/02/2015  . Hypothyroidism due to acquired atrophy of thyroid 08/02/2015  . Fibromyalgia 08/02/2015  . Sjogren's syndrome ([place]) 08/02/2015  . Peptic ulcer disease 08/02/2015  . Primary osteoarthritis of both knees 08/02/2015  . Osteopenia 08/02/2015  . Other allergic rhinitis 08/02/2015  . Hyperlipidemia, mixed 08/02/2015  . Hx of abnormal cervical Pap smear 08/02/2015    Prior to Admission medications   Medication Sig Start Date End Date Taking? Authorizing Provider  acetaminophen (TYLENOL) 500 MG tablet Take 1,000 mg by mouth every 8 (eight) hours as needed.   Yes [provider]  butalbital-acetaminophen-caffeine (FIORICET WITH CODEINE) 50-325-40-30 MG capsule Take 1 capsule by mouth every 4 (four) hours as needed for headache. 04/24/16  Yes Glean Hess, MD  calcium-vitamin D (OSCAL WITH D) 250-125 MG-UNIT tablet Take 1 tablet by mouth daily.   Yes [provider]  furosemide (LASIX) 40 MG tablet Take 1 tablet (40 mg total) by mouth daily. 01/31/16  Yes Glean Hess, MD  gabapentin (NEURONTIN) 600 MG tablet Take 600 mg by mouth 3 (three) times daily.   Yes [provider]  hydrocortisone 2.5 % cream Apply topically 2 (two) times daily. 11/02/15  Yes Glean Hess, MD  isometheptene-acetaminophen-dichloralphenazone ([place]) [phone] MG  capsule Take 1 capsule by mouth 4 (four) times daily as needed for migraine. Maximum 5 capsules in 12 hours for migraine headaches, 8 capsules in 24 hours for tension headaches.   Yes [provider]   levothyroxine (SYNTHROID, LEVOTHROID) 88 MCG tablet TAKE 1 TABLET BY MOUTH  DAILY BEFORE BREAKFAST 09/17/16  Yes Glean Hess, MD  lovastatin (MEVACOR) 40 MG tablet TAKE 1 TABLET BY MOUTH AT  BEDTIME 09/17/16  Yes Glean Hess, MD  montelukast (SINGULAIR) 10 MG tablet Take 1 tablet (10 mg total) by mouth at bedtime. 01/31/16  Yes Glean Hess, MD  polyethylene glycol (GOLYTELY) 236 g solution Drink one 8 oz glass every 20 mins until stools are clear. 06/05/16  Yes [name], MD  promethazine (PHENERGAN) 25 MG tablet Take 1 tablet (25 mg total) by mouth every 8 (eight) hours as needed for nausea or vomiting. 04/25/16  Yes Glean Hess, MD  propranolol (INDERAL) 40 MG tablet TAKE 1 TABLET BY MOUTH TWO  TIMES DAILY 09/17/16  Yes Glean Hess, MD  tiZANidine (ZANAFLEX) 4 MG tablet TAKE 1 TABLET BY MOUTH 3  TIMES DAILY 07/15/16  Yes Glean Hess, MD  traMADol (ULTRAM) 50 MG tablet Take 50 mg by mouth every 6 (six) hours as needed.   Yes [provider]    Allergies  Allergen Reactions  . Morphine And Related Nausea Only  . Elwyn Reach Hcl]     Past Surgical History:  Procedure Laterality Date  . ABDOMINAL HYSTERECTOMY    . AUGMENTATION MAMMAPLASTY Bilateral 1987  . COLONOSCOPY  05/2012   polyps  . COLONOSCOPY WITH PROPOFOL N/A 06/20/2016   Procedure: COLONOSCOPY WITH PROPOFOL;  Surgeon: [name], MD;  Location: [place];  Service: Endoscopy;  Laterality: N/A;  . ESOPHAGOGASTRODUODENOSCOPY (EGD) WITH PROPOFOL N/A 06/20/2016   Procedure: ESOPHAGOGASTRODUODENOSCOPY (EGD) WITH PROPOFOL;  Surgeon: [name], MD;  Location: [place];  Service: Endoscopy;  Laterality: N/A;  . FOOT SURGERY Right   . GANGLION CYST EXCISION Left   . NASAL SINUS SURGERY    . NEUROMA SURGERY Left   . PARTIAL HYSTERECTOMY  1996   +HPV and cervical dysplasia  . PLACEMENT OF BREAST IMPLANTS    . POLYPECTOMY  06/20/2016   Procedure: POLYPECTOMY;   Surgeon: [name], MD;  Location: [place];  Service: Endoscopy;;  . REPLACEMENT TOTAL KNEE Right 06/2014  . TONSILLECTOMY      Social History  Substance Use Topics  . Smoking status: Never Smoker  . Smokeless tobacco: Never Used  . Alcohol use No     Medication list has been reviewed and updated.   Physical Exam  Constitutional: She is oriented to person, place, and time. She appears well-developed. No distress.  HENT:  Head: Normocephalic and atraumatic.  Neck: Normal range of motion. Neck supple. Carotid bruit is not present.  Cardiovascular: Normal rate, regular rhythm and normal heart sounds.   Pulmonary/Chest: Effort normal and breath sounds normal. No respiratory distress. She has no wheezes.  Abdominal: Soft. Bowel sounds are normal.  Musculoskeletal: Normal range of motion.  Neurological: She is alert and oriented to person, place, and time.  Skin: Skin is warm and dry. No rash noted.  Psychiatric: She has a normal mood and affect. Her behavior is normal. Thought content normal.  Nursing note and vitals reviewed.   BP 108/64 (BP Location: Right Arm, Patient Position: Sitting, Cuff Size: Normal)   Pulse (!) 57   Ht  5\' 5"  (1.651 m)   Wt 182 lb (82.6 kg)   SpO2 97%   BMI 30.29 kg/m   Assessment and Plan: 1. Essential hypertension controlled - Comprehensive metabolic panel - propranolol (INDERAL) 40 MG tablet; Take 1 tablet (40 mg total) by mouth 2 (two) times daily.  Dispense: 180 tablet; Refill: 3  2. Other allergic rhinitis stable - montelukast (SINGULAIR) 10 MG tablet; Take 1 tablet (10 mg total) by mouth at bedtime.  Dispense: 90 tablet; Refill: 3  3. Hypothyroidism due to acquired atrophy of thyroid supplemented - TSH - levothyroxine (SYNTHROID, LEVOTHROID) 88 MCG tablet; TAKE 1 TABLET BY MOUTH  DAILY BEFORE BREAKFAST  Dispense: 90 tablet; Refill: 3  4. Hyperlipidemia, mixed On statin therapy - Lipid panel - lovastatin (MEVACOR) 40 MG  tablet; Take 1 tablet (40 mg total) by mouth at bedtime.  Dispense: 90 tablet; Refill: 3  5. Peptic ulcer disease Not controlled with pantoprazole - resume omeprazole - CBC with Differential/Platelet - omeprazole (PRILOSEC) 40 MG capsule; Take 1 capsule (40 mg total) by mouth daily.  Dispense: 90 capsule; Refill: 3  6. Fibromyalgia - tiZANidine (ZANAFLEX) 4 MG tablet; Take 1 tablet (4 mg total) by mouth 3 (three) times daily.  Dispense: 270 tablet; Refill: 3   Meds ordered this encounter  Medications  . omeprazole (PRILOSEC) 40 MG capsule    Sig: Take 1 capsule (40 mg total) by mouth daily.    Dispense:  90 capsule    Refill:  3  . tiZANidine (ZANAFLEX) 4 MG tablet    Sig: Take 1 tablet (4 mg total) by mouth 3 (three) times daily.    Dispense:  270 tablet    Refill:  3  . lovastatin (MEVACOR) 40 MG tablet    Sig: Take 1 tablet (40 mg total) by mouth at bedtime.    Dispense:  90 tablet    Refill:  3  . levothyroxine (SYNTHROID, LEVOTHROID) 88 MCG tablet    Sig: TAKE 1 TABLET BY MOUTH  DAILY BEFORE BREAKFAST    Dispense:  90 tablet    Refill:  3  . propranolol (INDERAL) 40 MG tablet    Sig: Take 1 tablet (40 mg total) by mouth 2 (two) times daily.    Dispense:  180 tablet    Refill:  3  . montelukast (SINGULAIR) 10 MG tablet    Sig: Take 1 tablet (10 mg total) by mouth at bedtime.    Dispense:  90 tablet    Refill:  [place], MD [facility]  10/30/2016

## 2016-10-31 LAB — COMPREHENSIVE METABOLIC PANEL
ALBUMIN: 4.5 g/dL (ref 3.5–5.5)
ALK PHOS: 81 IU/L (ref 39–117)
ALT: 26 IU/L (ref 0–32)
AST: 24 IU/L (ref 0–40)
Albumin/Globulin Ratio: 1.6 (ref 1.2–2.2)
BILIRUBIN TOTAL: 0.4 mg/dL (ref 0.0–1.2)
BUN / CREAT RATIO: 14 (ref 9–23)
BUN: 14 mg/dL (ref 6–24)
CHLORIDE: 99 mmol/L (ref 96–106)
CO2: 28 mmol/L (ref 18–29)
CREATININE: 1 mg/dL (ref 0.57–1.00)
Calcium: 10.2 mg/dL (ref 8.7–10.2)
GFR calc Af Amer: 72 mL/min/{1.73_m2} (ref >59)
GFR calc non Af Amer: 63 mL/min/{1.73_m2} (ref >59)
GLOBULIN, TOTAL: 2.8 g/dL (ref 1.5–4.5)
GLUCOSE: 91 mg/dL (ref 65–99)
Potassium: 5.6 mmol/L — ABNORMAL HIGH (ref 3.5–5.2)
SODIUM: 143 mmol/L (ref 134–144)
Total Protein: 7.3 g/dL (ref 6.0–8.5)

## 2016-10-31 LAB — CBC WITH DIFFERENTIAL/PLATELET
BASOS: 0 %
Basophils Absolute: 0 10*3/uL (ref 0.0–0.2)
EOS (ABSOLUTE): 0.2 10*3/uL (ref 0.0–0.4)
EOS: 2 %
HEMOGLOBIN: 14.4 g/dL (ref 11.1–15.9)
Hematocrit: 45.3 % (ref 34.0–46.6)
IMMATURE GRANS (ABS): 0 10*3/uL (ref 0.0–0.1)
IMMATURE GRANULOCYTES: 0 %
LYMPHS: 23 %
Lymphocytes Absolute: 1.8 10*3/uL (ref 0.7–3.1)
MCH: 29.2 pg (ref 26.6–33.0)
MCHC: 31.8 g/dL (ref 31.5–35.7)
MCV: 92 fL (ref 79–97)
MONOCYTES: 10 %
Monocytes Absolute: 0.8 10*3/uL (ref 0.1–0.9)
NEUTROS ABS: 5.2 10*3/uL (ref 1.4–7.0)
NEUTROS PCT: 65 %
PLATELETS: 248 10*3/uL (ref 150–379)
RBC: 4.93 x10E6/uL (ref 3.77–5.28)
RDW: 13.6 % (ref 12.3–15.4)
WBC: 7.9 10*3/uL (ref 3.4–10.8)

## 2016-10-31 LAB — LIPID PANEL
Chol/HDL Ratio: 2.6 ratio (ref 0.0–4.4)
Cholesterol, Total: 198 mg/dL (ref 100–199)
HDL: 77 mg/dL (ref >39)
LDL CALC: 101 mg/dL — AB (ref 0–99)
Triglycerides: 102 mg/dL (ref 0–149)
VLDL CHOLESTEROL CAL: 20 mg/dL (ref 5–40)

## 2016-10-31 LAB — TSH: TSH: 2.99 u[IU]/mL (ref 0.450–4.500)

## 2016-11-08 DIAGNOSIS — Z885 Allergy status to narcotic agent status: Secondary | ICD-10-CM | POA: Diagnosis not present

## 2016-11-08 DIAGNOSIS — I10 Essential (primary) hypertension: Secondary | ICD-10-CM | POA: Diagnosis not present

## 2016-11-08 DIAGNOSIS — M357 Hypermobility syndrome: Secondary | ICD-10-CM | POA: Diagnosis not present

## 2016-11-08 DIAGNOSIS — Z8261 Family history of arthritis: Secondary | ICD-10-CM | POA: Diagnosis not present

## 2016-11-08 DIAGNOSIS — E039 Hypothyroidism, unspecified: Secondary | ICD-10-CM | POA: Diagnosis not present

## 2016-11-08 DIAGNOSIS — G43809 Other migraine, not intractable, without status migrainosus: Secondary | ICD-10-CM | POA: Diagnosis not present

## 2016-11-08 DIAGNOSIS — E785 Hyperlipidemia, unspecified: Secondary | ICD-10-CM | POA: Diagnosis not present

## 2016-11-08 DIAGNOSIS — M3500 Sicca syndrome, unspecified: Secondary | ICD-10-CM | POA: Diagnosis not present

## 2016-11-08 DIAGNOSIS — T84022A Instability of internal right knee prosthesis, initial encounter: Secondary | ICD-10-CM | POA: Diagnosis not present

## 2016-11-08 DIAGNOSIS — M2351 Chronic instability of knee, right knee: Secondary | ICD-10-CM | POA: Diagnosis not present

## 2016-11-08 DIAGNOSIS — Z888 Allergy status to other drugs, medicaments and biological substances status: Secondary | ICD-10-CM | POA: Diagnosis not present

## 2016-11-08 DIAGNOSIS — Z79899 Other long term (current) drug therapy: Secondary | ICD-10-CM | POA: Diagnosis not present

## 2016-11-08 DIAGNOSIS — Z8249 Family history of ischemic heart disease and other diseases of the circulatory system: Secondary | ICD-10-CM | POA: Diagnosis not present

## 2016-11-08 DIAGNOSIS — Z8711 Personal history of peptic ulcer disease: Secondary | ICD-10-CM | POA: Diagnosis not present

## 2016-11-08 DIAGNOSIS — K219 Gastro-esophageal reflux disease without esophagitis: Secondary | ICD-10-CM | POA: Diagnosis not present

## 2016-11-13 DIAGNOSIS — H16229 Keratoconjunctivitis sicca, not specified as Sjogren's, unspecified eye: Secondary | ICD-10-CM | POA: Diagnosis not present

## 2016-11-13 DIAGNOSIS — I10 Essential (primary) hypertension: Secondary | ICD-10-CM | POA: Diagnosis not present

## 2016-11-13 DIAGNOSIS — T84032D Mechanical loosening of internal right knee prosthetic joint, subsequent encounter: Secondary | ICD-10-CM | POA: Diagnosis not present

## 2016-11-14 DIAGNOSIS — H16229 Keratoconjunctivitis sicca, not specified as Sjogren's, unspecified eye: Secondary | ICD-10-CM | POA: Diagnosis not present

## 2016-11-14 DIAGNOSIS — I10 Essential (primary) hypertension: Secondary | ICD-10-CM | POA: Diagnosis not present

## 2016-11-14 DIAGNOSIS — T84032D Mechanical loosening of internal right knee prosthetic joint, subsequent encounter: Secondary | ICD-10-CM | POA: Diagnosis not present

## 2016-11-20 DIAGNOSIS — T84022A Instability of internal right knee prosthesis, initial encounter: Secondary | ICD-10-CM | POA: Diagnosis not present

## 2016-11-21 ENCOUNTER — Other Ambulatory Visit: Payer: Self-pay | Admitting: Internal Medicine

## 2017-01-23 ENCOUNTER — Other Ambulatory Visit: Payer: Self-pay | Admitting: Internal Medicine

## 2017-01-23 DIAGNOSIS — Z1231 Encounter for screening mammogram for malignant neoplasm of breast: Secondary | ICD-10-CM

## 2017-01-24 ENCOUNTER — Encounter: Payer: Self-pay | Admitting: Internal Medicine

## 2017-01-24 ENCOUNTER — Ambulatory Visit (INDEPENDENT_AMBULATORY_CARE_PROVIDER_SITE_OTHER): Payer: Medicare Other | Admitting: Internal Medicine

## 2017-01-24 VITALS — BP 124/80 | HR 68 | Temp 98.7°F | Ht 65.0 in | Wt 180.6 lb

## 2017-01-24 DIAGNOSIS — M1990 Unspecified osteoarthritis, unspecified site: Secondary | ICD-10-CM | POA: Insufficient documentation

## 2017-01-24 DIAGNOSIS — Z560 Unemployment, unspecified: Secondary | ICD-10-CM | POA: Diagnosis not present

## 2017-01-24 DIAGNOSIS — H60503 Unspecified acute noninfective otitis externa, bilateral: Secondary | ICD-10-CM | POA: Diagnosis not present

## 2017-01-24 DIAGNOSIS — H1033 Unspecified acute conjunctivitis, bilateral: Secondary | ICD-10-CM | POA: Diagnosis not present

## 2017-01-24 MED ORDER — NEOMYCIN-POLYMYXIN-DEXAMETH 3.5-10000-0.1 OP SUSP: 2.0000 [drp] | Freq: Four times a day (QID) | OPHTHALMIC | 0 refills | Status: DC

## 2017-01-24 MED ORDER — NEOMYCIN-POLYMYXIN-HC 3.5-10000-1 OT SOLN: 3.0000 [drp] | Freq: Four times a day (QID) | OTIC | 0 refills | Status: DC

## 2017-01-24 MED ORDER — HYDROCORTISONE 2.5 % EX CREA: TOPICAL_CREAM | Freq: Two times a day (BID) | CUTANEOUS | 1 refills | Status: DC

## 2017-01-24 NOTE — Progress Notes (Signed)
Date:  01/24/2017   Name:  Ashley Glover   DOB:  Nov 22, 1960   MRN:  [id number]   Chief Complaint: Otalgia (Right ear is draining and aching. 2 weeks for ear. ) and Allergic Rhinitis  (Eyes are both pink and runny. And woke up this morning and they were stuck together due to crustiness. ) Otalgia   This is a new problem. The current episode started 1 to 4 weeks ago. The problem occurs constantly. The problem has been gradually worsening. Pertinent negatives include no ear discharge.  Conjunctivitis   The current episode started today. The onset was sudden. Associated symptoms include ear pain, eye discharge and eye redness. Pertinent negatives include no fever and no ear discharge.     Review of Systems  Constitutional: Positive for fatigue. Negative for fever.  HENT: Positive for ear pain. Negative for ear discharge.   Eyes: Positive for discharge and redness.  Musculoskeletal: Positive for arthralgias and myalgias.  Psychiatric/Behavioral: Positive for sleep disturbance.    Patient Active Problem List   Diagnosis Date Noted  . Frequent PVCs 08/20/2016  . Benign neoplasm of ascending colon   . Benign neoplasm of cecum   . Hx of colonic polyps 04/24/2016  . Periodic headache syndrome, not intractable 01/31/2016  . Rosacea, acne 01/31/2016  . Essential hypertension 08/02/2015  . Hypothyroidism due to acquired atrophy of thyroid 08/02/2015  . Fibromyalgia 08/02/2015  . Sjogren's syndrome ([place]) 08/02/2015  . Peptic ulcer disease 08/02/2015  . Primary osteoarthritis of both knees 08/02/2015  . Osteopenia 08/02/2015  . Other allergic rhinitis 08/02/2015  . Hyperlipidemia, mixed 08/02/2015  . Hx of abnormal cervical Pap smear 08/02/2015    Prior to Admission medications   Medication Sig Start Date End Date Taking? Authorizing Provider  acetaminophen (TYLENOL) 500 MG tablet Take 1,000 mg by mouth every 8 (eight) hours as needed.   Yes [provider]    butalbital-acetaminophen-caffeine (FIORICET WITH CODEINE) 50-325-40-30 MG capsule Take 1 capsule by mouth every 4 (four) hours as needed for headache. 04/24/16  Yes Glean Hess, MD  calcium-vitamin D (OSCAL WITH D) 250-125 MG-UNIT tablet Take 1 tablet by mouth daily.   Yes [provider]  furosemide (LASIX) 40 MG tablet Take 1 tablet (40 mg total) by mouth daily. 01/31/16  Yes Glean Hess, MD  gabapentin (NEURONTIN) 600 MG tablet Take 600 mg by mouth 3 (three) times daily.   Yes [provider]  HYDROcodone-acetaminophen (NORCO) 7.5-325 MG tablet Take 1 tablet by mouth every 6 (six) hours as needed for moderate pain.   Yes [provider]  hydrocortisone 2.5 % cream APPLY TWO TIMES DAILY  TOPICALLY 11/21/16  Yes Glean Hess, MD  isometheptene-acetaminophen-dichloralphenazone (MIDRIN) [phone] MG capsule Take 1 capsule by mouth 4 (four) times daily as needed for migraine. Maximum 5 capsules in 12 hours for migraine headaches, 8 capsules in 24 hours for tension headaches.   Yes [provider]  levothyroxine (SYNTHROID, LEVOTHROID) 88 MCG tablet TAKE 1 TABLET BY MOUTH  DAILY BEFORE BREAKFAST 10/30/16  Yes Glean Hess, MD  lovastatin (MEVACOR) 40 MG tablet Take 1 tablet (40 mg total) by mouth at bedtime. 10/30/16  Yes Glean Hess, MD  montelukast (SINGULAIR) 10 MG tablet Take 1 tablet (10 mg total) by mouth at bedtime. 10/30/16  Yes Glean Hess, MD  omeprazole (PRILOSEC) 40 MG capsule Take 1 capsule (40 mg total) by mouth daily. 10/30/16  Yes Glean Hess, MD  promethazine (PHENERGAN) 25 MG tablet Take 1 tablet (25 mg total) by mouth every 8 (eight) hours as needed for nausea or vomiting. 04/25/16  Yes Glean Hess, MD  propranolol (INDERAL) 40 MG tablet Take 1 tablet (40 mg total) by mouth 2 (two) times daily. 10/30/16  Yes Glean Hess, MD  tiZANidine (ZANAFLEX) 4 MG tablet Take 1 tablet (4 mg total) by mouth 3 (three) times  daily. 10/30/16  Yes Glean Hess, MD  traMADol (ULTRAM) 50 MG tablet Take 50 mg by mouth every 6 (six) hours as needed.   Yes [provider]    Allergies  Allergen Reactions  . Morphine And Related Nausea Only  . Elwyn Reach Hcl]     Past Surgical History:  Procedure Laterality Date  . ABDOMINAL HYSTERECTOMY    . AUGMENTATION MAMMAPLASTY Bilateral 1987  . COLONOSCOPY  05/2012   polyps  . COLONOSCOPY WITH PROPOFOL N/A 06/20/2016   Procedure: COLONOSCOPY WITH PROPOFOL;  Surgeon: [name], MD;  Location: [place];  Service: Endoscopy;  Laterality: N/A;  . ESOPHAGOGASTRODUODENOSCOPY (EGD) WITH PROPOFOL N/A 06/20/2016   Procedure: ESOPHAGOGASTRODUODENOSCOPY (EGD) WITH PROPOFOL;  Surgeon: [name], MD;  Location: [place];  Service: Endoscopy;  Laterality: N/A;  . FOOT SURGERY Right   . GANGLION CYST EXCISION Left   . NASAL SINUS SURGERY    . NEUROMA SURGERY Left   . PARTIAL HYSTERECTOMY  1996   +HPV and cervical dysplasia  . PLACEMENT OF BREAST IMPLANTS    . POLYPECTOMY  06/20/2016   Procedure: POLYPECTOMY;  Surgeon: [name], MD;  Location: [place];  Service: Endoscopy;;  . REPLACEMENT TOTAL KNEE Right 06/2014  . TONSILLECTOMY      Social History  Substance Use Topics  . Smoking status: Never Smoker  . Smokeless tobacco: Never Used  . Alcohol use No     Medication list has been reviewed and updated.  PHQ 2/9 Scores 01/31/2016  PHQ - 2 Score 0    Physical Exam  HENT:  Right Ear: There is swelling. Tympanic membrane is retracted. No middle ear effusion.  Left Ear: Tympanic membrane is erythematous and retracted.  No middle ear effusion.  Nose: Right sinus exhibits no maxillary sinus tenderness. Left sinus exhibits no maxillary sinus tenderness.  Mouth/Throat: No posterior oropharyngeal edema or posterior oropharyngeal erythema.  Eyes: Pupils are equal, round, and reactive to light. EOM are normal. Right eye  exhibits chemosis. Left eye exhibits chemosis. Right conjunctiva is injected. Right conjunctiva has no hemorrhage. Left conjunctiva is injected. Left conjunctiva has no hemorrhage.  Cardiovascular: Normal rate, regular rhythm and normal heart sounds.   Pulmonary/Chest: Effort normal and breath sounds normal. No respiratory distress.  Musculoskeletal: She exhibits no edema.    BP 124/80   Pulse 68   Temp 98.7 F (37.1 C)   Ht 5\' 5"  (1.651 m)   Wt 180 lb 9.6 oz (81.9 kg)   SpO2 99%   BMI 30.05 kg/m   Assessment and Plan: 1. Acute otitis externa of both ears, unspecified type - neomycin-polymyxin-hydrocortisone (CORTISPORIN) OTIC solution; Place 3 drops into both ears 4 (four) times daily.  Dispense: 10 mL; Refill: 0  2. Acute conjunctivitis of both eyes, unspecified acute conjunctivitis type - neomycin-polymyxin b-dexamethasone (MAXITROL) 3.5-10000-0.1 SUSP; Place 2 drops into both eyes every 6 (six) hours.  Dispense: 5 mL; Refill: 0  3. Not currently working due to disabled status Forms will be completed  Meds ordered this encounter  Medications  .  neomycin-polymyxin b-dexamethasone (MAXITROL) 3.5-10000-0.1 SUSP    Sig: Place 2 drops into both eyes every 6 (six) hours.    Dispense:  5 mL    Refill:  0  . neomycin-polymyxin-hydrocortisone (CORTISPORIN) OTIC solution    Sig: Place 3 drops into both ears 4 (four) times daily.    Dispense:  10 mL    Refill:  0  . hydrocortisone 2.5 % cream    Sig: Apply topically 2 (two) times daily.    Dispense:  60 g    Refill:  1    Partially dictated using Editor, commissioning. Any errors are unintentional.  Halina Maidens, MD [facility]  01/24/2017

## 2017-03-04 ENCOUNTER — Ambulatory Visit (INDEPENDENT_AMBULATORY_CARE_PROVIDER_SITE_OTHER): Payer: Medicare Other | Admitting: Internal Medicine

## 2017-03-04 ENCOUNTER — Encounter: Payer: Self-pay | Admitting: Internal Medicine

## 2017-03-04 VITALS — BP 114/72 | HR 61 | Ht 65.0 in | Wt 187.0 lb

## 2017-03-04 DIAGNOSIS — G43C0 Periodic headache syndromes in child or adult, not intractable: Secondary | ICD-10-CM | POA: Diagnosis not present

## 2017-03-04 DIAGNOSIS — Z23 Encounter for immunization: Secondary | ICD-10-CM

## 2017-03-04 DIAGNOSIS — M797 Fibromyalgia: Secondary | ICD-10-CM

## 2017-03-04 MED ORDER — BUTALBITAL-APAP-CAFF-COD 50-325-40-30 MG PO CAPS: 1.0000 | ORAL_CAPSULE | ORAL | 0 refills | Status: DC | PRN

## 2017-03-04 MED ORDER — GABAPENTIN 600 MG PO TABS: 600.0000 mg | ORAL_TABLET | Freq: Three times a day (TID) | ORAL | 3 refills | Status: DC

## 2017-03-04 NOTE — Progress Notes (Signed)
Date:  03/04/2017   Name:  Ashley Glover   DOB:  [birth year]   MRN:  [id number]   Chief Complaint: Hypertension; Hyperlipidemia; and Hypothyroidism Needs refill on fioricet and gabapentin    Hypertension  This is a chronic problem. The problem is controlled. Associated symptoms include headaches. Pertinent negatives include no chest pain, palpitations or shortness of breath. Past treatments include beta blockers and diuretics.  Migraine   This is a chronic problem. The problem occurs daily. The problem has been unchanged. The pain is located in the occipital region. Pertinent negatives include no dizziness or fever. Treatments tried: many interventions tried - now using fioricet. Her past medical history is significant for hypertension.  Fibromyalgia - continues on gabapentin tid.  Sx are stable.  Does fairly well most days.  Still getting cortisone injections in knee, ankles and hips from Orthopedics.    Review of Systems  Constitutional: Positive for fatigue. Negative for chills and fever.  Respiratory: Negative for chest tightness and shortness of breath.   Cardiovascular: Negative for chest pain, palpitations and leg swelling.  Musculoskeletal: Positive for arthralgias and myalgias.  Neurological: Positive for headaches. Negative for dizziness.    Patient Active Problem List   Diagnosis Date Noted  . Not currently working due to disabled status 01/24/2017  . Degenerative arthritis 01/24/2017  . Frequent PVCs 08/20/2016  . Benign neoplasm of ascending colon   . Benign neoplasm of cecum   . Hx of colonic polyps 04/24/2016  . Periodic headache syndrome, not intractable 01/31/2016  . Rosacea, acne 01/31/2016  . Essential hypertension 08/02/2015  . Hypothyroidism due to acquired atrophy of thyroid 08/02/2015  . Fibromyalgia 08/02/2015  . Sjogren's syndrome ([place]) 08/02/2015  . Peptic ulcer disease 08/02/2015  . Primary osteoarthritis of both knees 08/02/2015  . Osteopenia  08/02/2015  . Other allergic rhinitis 08/02/2015  . Hyperlipidemia, mixed 08/02/2015  . Hx of abnormal cervical Pap smear 08/02/2015    Prior to Admission medications   Medication Sig Start Date End Date Taking? Authorizing Provider  butalbital-acetaminophen-caffeine (FIORICET WITH CODEINE) 50-325-40-30 MG capsule Take 1 capsule by mouth every 4 (four) hours as needed for headache. 04/24/16  Yes Glean Hess, MD  calcium-vitamin D (OSCAL WITH D) 250-125 MG-UNIT tablet Take 1 tablet by mouth daily.   Yes [provider]  furosemide (LASIX) 40 MG tablet Take 1 tablet (40 mg total) by mouth daily. 01/31/16  Yes Glean Hess, MD  gabapentin (NEURONTIN) 600 MG tablet Take 600 mg by mouth 3 (three) times daily.   Yes [provider]  hydrocortisone 2.5 % cream Apply topically 2 (two) times daily. 01/24/17  Yes Glean Hess, MD  ibuprofen (ADVIL,MOTRIN) 200 MG tablet Take 400 mg by mouth every 6 (six) hours as needed.   Yes [provider]  isometheptene-acetaminophen-dichloralphenazone (MIDRIN) 65-100-325 MG capsule Take 1 capsule by mouth 4 (four) times daily as needed for migraine. Maximum 5 capsules in 12 hours for migraine headaches, 8 capsules in 24 hours for tension headaches.   Yes [provider]  levothyroxine (SYNTHROID, LEVOTHROID) 88 MCG tablet TAKE 1 TABLET BY MOUTH  DAILY BEFORE BREAKFAST 10/30/16  Yes Glean Hess, MD  lovastatin (MEVACOR) 40 MG tablet Take 1 tablet (40 mg total) by mouth at bedtime. 10/30/16  Yes Glean Hess, MD  montelukast (SINGULAIR) 10 MG tablet Take 1 tablet (10 mg total) by mouth at bedtime. 10/30/16  Yes Glean Hess, MD  neomycin-polymyxin b-dexamethasone (MAXITROL)  3.5-10000-0.1 SUSP Place 2 drops into both eyes every 6 (six) hours. 01/24/17  Yes Glean Hess, MD  neomycin-polymyxin-hydrocortisone (CORTISPORIN) OTIC solution Place 3 drops into both ears 4 (four) times daily. 01/24/17  Yes Glean Hess, MD  omeprazole (PRILOSEC) 40 MG capsule Take 1 capsule (40 mg total) by mouth daily. 10/30/16  Yes Glean Hess, MD  promethazine (PHENERGAN) 25 MG tablet Take 1 tablet (25 mg total) by mouth every 8 (eight) hours as needed for nausea or vomiting. 04/25/16  Yes Glean Hess, MD  propranolol (INDERAL) 40 MG tablet Take 1 tablet (40 mg total) by mouth 2 (two) times daily. 10/30/16  Yes Glean Hess, MD  tiZANidine (ZANAFLEX) 4 MG tablet Take 1 tablet (4 mg total) by mouth 3 (three) times daily. 10/30/16  Yes Glean Hess, MD  traMADol (ULTRAM) 50 MG tablet Take 50 mg by mouth every 6 (six) hours as needed.   Yes [provider]  HYDROcodone-acetaminophen (NORCO) 7.5-325 MG tablet Take 1 tablet by mouth every 6 (six) hours as needed for moderate pain.    [provider]    Allergies  Allergen Reactions  . Morphine And Related Nausea Only  . Elwyn Reach Hcl]     Past Surgical History:  Procedure Laterality Date  . ABDOMINAL HYSTERECTOMY    . AUGMENTATION MAMMAPLASTY Bilateral 1987  . COLONOSCOPY  05/2012   polyps  . COLONOSCOPY WITH PROPOFOL N/A 06/20/2016   Procedure: COLONOSCOPY WITH PROPOFOL;  Surgeon: [name], MD;  Location: [place];  Service: Endoscopy;  Laterality: N/A;  . ESOPHAGOGASTRODUODENOSCOPY (EGD) WITH PROPOFOL N/A 06/20/2016   Procedure: ESOPHAGOGASTRODUODENOSCOPY (EGD) WITH PROPOFOL;  Surgeon: [name], MD;  Location: [place];  Service: Endoscopy;  Laterality: N/A;  . FOOT SURGERY Right   . GANGLION CYST EXCISION Left   . NASAL SINUS SURGERY    . NEUROMA SURGERY Left   . PARTIAL HYSTERECTOMY  1996   +HPV and cervical dysplasia  . PLACEMENT OF BREAST IMPLANTS    . POLYPECTOMY  06/20/2016   Procedure: POLYPECTOMY;  Surgeon: [name], MD;  Location: [place];  Service: Endoscopy;;  . REPLACEMENT TOTAL KNEE Right 06/2014  . TONSILLECTOMY      Social History  Substance Use Topics    . Smoking status: Never Smoker  . Smokeless tobacco: Never Used  . Alcohol use No     Medication list has been reviewed and updated.  PHQ 2/9 Scores 01/31/2016  PHQ - 2 Score 0    Physical Exam  Constitutional: She is oriented to person, place, and time. She appears well-developed. No distress.  HENT:  Head: Normocephalic and atraumatic.  Neck: Normal range of motion. Neck supple. No thyromegaly present.  Cardiovascular: Normal rate, regular rhythm and normal heart sounds.   Pulmonary/Chest: Effort normal and breath sounds normal. No respiratory distress. She has no wheezes.  Musculoskeletal: She exhibits no edema.  Neurological: She is alert and oriented to person, place, and time.  Skin: Skin is warm and dry. No rash noted.  Psychiatric: She has a normal mood and affect. Her behavior is normal. Thought content normal.  Nursing note and vitals reviewed.   BP 114/72   Pulse 61   Ht 5\' 5"  (1.651 m)   Wt 187 lb (84.8 kg)   SpO2 98%   BMI 31.12 kg/m   Assessment and Plan: 1. Periodic headache syndrome, not intractable Stable, unchanged - butalbital-acetaminophen-caffeine (FIORICET WITH CODEINE) 50-325-40-30 MG capsule;  Take 1 capsule by mouth every 4 (four) hours as needed for headache.  Dispense: 60 capsule; Refill: 0  2. Fibromyalgia continue gabapentin  3. Need for influenza vaccination   Meds ordered this encounter  Medications  . gabapentin (NEURONTIN) 600 MG tablet    Sig: Take 1 tablet (600 mg total) by mouth 3 (three) times daily.    Dispense:  270 tablet    Refill:  3  . butalbital-acetaminophen-caffeine (FIORICET WITH CODEINE) 50-325-40-30 MG capsule    Sig: Take 1 capsule by mouth every 4 (four) hours as needed for headache.    Dispense:  60 capsule    Refill:  0    Partially dictated using Editor, commissioning. Any errors are unintentional.  Halina Maidens, MD [facility]  03/04/2017

## 2017-03-10 ENCOUNTER — Ambulatory Visit
Admission: RE | Admit: 2017-03-10 | Discharge: 2017-03-10 | Disposition: A | Discharge status: Home / Self Care | Payer: Medicare Other | Source: Ambulatory Visit | Attending: Internal Medicine | Admitting: Internal Medicine

## 2017-03-10 DIAGNOSIS — Z1231 Encounter for screening mammogram for malignant neoplasm of breast: Secondary | ICD-10-CM | POA: Diagnosis not present

## 2017-03-10 DIAGNOSIS — Z9882 Breast implant status: Secondary | ICD-10-CM | POA: Diagnosis not present

## 2017-03-10 DIAGNOSIS — M1712 Unilateral primary osteoarthritis, left knee: Secondary | ICD-10-CM | POA: Diagnosis not present

## 2017-03-10 DIAGNOSIS — M67372 Transient synovitis, left ankle and foot: Secondary | ICD-10-CM | POA: Diagnosis not present

## 2017-03-10 DIAGNOSIS — M67371 Transient synovitis, right ankle and foot: Secondary | ICD-10-CM | POA: Diagnosis not present

## 2017-04-24 DIAGNOSIS — E782 Mixed hyperlipidemia: Secondary | ICD-10-CM | POA: Diagnosis not present

## 2017-04-24 DIAGNOSIS — I10 Essential (primary) hypertension: Secondary | ICD-10-CM | POA: Diagnosis not present

## 2017-04-24 DIAGNOSIS — I493 Ventricular premature depolarization: Secondary | ICD-10-CM | POA: Diagnosis not present

## 2017-04-29 DIAGNOSIS — D2330 Other benign neoplasm of skin of unspecified part of face: Secondary | ICD-10-CM | POA: Diagnosis not present

## 2017-04-29 DIAGNOSIS — L700 Acne vulgaris: Secondary | ICD-10-CM | POA: Diagnosis not present

## 2017-05-01 ENCOUNTER — Encounter: Payer: Self-pay | Admitting: Internal Medicine

## 2017-05-01 ENCOUNTER — Ambulatory Visit (INDEPENDENT_AMBULATORY_CARE_PROVIDER_SITE_OTHER): Payer: Medicare Other | Admitting: Internal Medicine

## 2017-05-01 VITALS — BP 122/80 | HR 56 | Temp 98.4°F | Ht 65.0 in | Wt 187.0 lb

## 2017-05-01 DIAGNOSIS — H1033 Unspecified acute conjunctivitis, bilateral: Secondary | ICD-10-CM

## 2017-05-01 DIAGNOSIS — J0100 Acute maxillary sinusitis, unspecified: Secondary | ICD-10-CM | POA: Diagnosis not present

## 2017-05-01 MED ORDER — AMOXICILLIN-POT CLAVULANATE 875-125 MG PO TABS: 1.0000 | ORAL_TABLET | Freq: Two times a day (BID) | ORAL | 0 refills | Status: AC

## 2017-05-01 MED ORDER — NEOMYCIN-POLYMYXIN-DEXAMETH 3.5-10000-0.1 OP SUSP: 2.0000 [drp] | Freq: Four times a day (QID) | OPHTHALMIC | 0 refills | Status: DC

## 2017-05-01 NOTE — Progress Notes (Signed)
Date:  05/01/2017   Name:  Ashley Glover   DOB:  1961-01-18   MRN:  [id number]   Chief Complaint: Cough (Started over 2 weeks ago- congestion, cough, headache, and sore throat. Green production. Fever in the beginning and then went away. Had old antibiotics- doxycycline. Took all of them. Stopped four days ago and got worse. Trying otc cold tylenol medicine. Eyes are red and runny. Ran out of eye drops and needs more. )  Sinusitis  This is a new problem. The current episode started in the past 7 days. The problem has been gradually worsening since onset. There has been no fever. Associated symptoms include congestion, ear pain, sinus pressure and a sore throat. Pertinent negatives include no chills or shortness of breath.  She felt better after a course of Doxycycline but two days ago started to have more sinus pressure and green drainage.  Also has tearing red eyes without crusting or vision changes.   Review of Systems  Constitutional: Negative for chills, fatigue and fever.  HENT: Positive for congestion, ear pain, sinus pressure and sore throat.   Eyes: Positive for discharge and redness. Negative for visual disturbance.  Respiratory: Negative for chest tightness and shortness of breath.   Cardiovascular: Negative for chest pain.    Patient Active Problem List   Diagnosis Date Noted  . Not currently working due to disabled status 01/24/2017  . Degenerative arthritis 01/24/2017  . Frequent PVCs 08/20/2016  . Benign neoplasm of ascending colon   . Benign neoplasm of cecum   . Hx of colonic polyps 04/24/2016  . Periodic headache syndrome, not intractable 01/31/2016  . Rosacea, acne 01/31/2016  . Essential hypertension 08/02/2015  . Hypothyroidism due to acquired atrophy of thyroid 08/02/2015  . Fibromyalgia 08/02/2015  . Sjogren's syndrome ([place]) 08/02/2015  . Peptic ulcer disease 08/02/2015  . Primary osteoarthritis of both knees 08/02/2015  . Osteopenia 08/02/2015  . Other  allergic rhinitis 08/02/2015  . Hyperlipidemia, mixed 08/02/2015  . Hx of abnormal cervical Pap smear 08/02/2015    Prior to Admission medications   Medication Sig Start Date End Date Taking? Authorizing Provider  butalbital-acetaminophen-caffeine (FIORICET WITH CODEINE) 50-325-40-30 MG capsule Take 1 capsule by mouth every 4 (four) hours as needed for headache. 03/04/17  Yes Glean Hess, MD  calcium-vitamin D (OSCAL WITH D) 250-125 MG-UNIT tablet Take 1 tablet by mouth daily.   Yes [provider]  furosemide (LASIX) 40 MG tablet Take 1 tablet (40 mg total) by mouth daily. 01/31/16  Yes Glean Hess, MD  gabapentin (NEURONTIN) 600 MG tablet Take 1 tablet (600 mg total) by mouth 3 (three) times daily. 03/04/17  Yes Glean Hess, MD  hydrocortisone 2.5 % cream Apply topically 2 (two) times daily. 01/24/17  Yes Glean Hess, MD  ibuprofen (ADVIL,MOTRIN) 200 MG tablet Take 400 mg by mouth every 6 (six) hours as needed.   Yes [provider]  isometheptene-acetaminophen-dichloralphenazone (MIDRIN) 65-100-325 MG capsule Take 1 capsule by mouth 4 (four) times daily as needed for migraine. Maximum 5 capsules in 12 hours for migraine headaches, 8 capsules in 24 hours for tension headaches.   Yes [provider]  levothyroxine (SYNTHROID, LEVOTHROID) 88 MCG tablet TAKE 1 TABLET BY MOUTH  DAILY BEFORE BREAKFAST 10/30/16  Yes Glean Hess, MD  lovastatin (MEVACOR) 40 MG tablet Take 1 tablet (40 mg total) by mouth at bedtime. 10/30/16  Yes Glean Hess, MD  montelukast (SINGULAIR) 10 MG tablet  Take 1 tablet (10 mg total) by mouth at bedtime. 10/30/16  Yes Glean Hess, MD  neomycin-polymyxin b-dexamethasone (MAXITROL) 3.5-10000-0.1 SUSP Place 2 drops into both eyes every 6 (six) hours. 01/24/17  Yes Glean Hess, MD  neomycin-polymyxin-hydrocortisone (CORTISPORIN) OTIC solution Place 3 drops into both ears 4 (four) times daily. 01/24/17  Yes Glean Hess, MD  omeprazole (PRILOSEC) 40 MG capsule Take 1 capsule (40 mg total) by mouth daily. 10/30/16  Yes Glean Hess, MD  promethazine (PHENERGAN) 25 MG tablet Take 1 tablet (25 mg total) by mouth every 8 (eight) hours as needed for nausea or vomiting. 04/25/16  Yes Glean Hess, MD  propranolol (INDERAL) 40 MG tablet Take 1 tablet (40 mg total) by mouth 2 (two) times daily. 10/30/16  Yes Glean Hess, MD  tiZANidine (ZANAFLEX) 4 MG tablet Take 1 tablet (4 mg total) by mouth 3 (three) times daily. 10/30/16  Yes Glean Hess, MD  traMADol (ULTRAM) 50 MG tablet Take 50 mg by mouth every 6 (six) hours as needed.   Yes [provider]  HYDROcodone-acetaminophen (NORCO) 7.5-325 MG tablet Take 1 tablet by mouth every 6 (six) hours as needed for moderate pain.    [provider]    Allergies  Allergen Reactions  . Morphine And Related Nausea Only  . Elwyn Reach Hcl]     Past Surgical History:  Procedure Laterality Date  . ABDOMINAL HYSTERECTOMY    . AUGMENTATION MAMMAPLASTY Bilateral 1987  . COLONOSCOPY  05/2012   polyps  . COLONOSCOPY WITH PROPOFOL N/A 06/20/2016   Procedure: COLONOSCOPY WITH PROPOFOL;  Surgeon: [name], MD;  Location: [place];  Service: Endoscopy;  Laterality: N/A;  . ESOPHAGOGASTRODUODENOSCOPY (EGD) WITH PROPOFOL N/A 06/20/2016   Procedure: ESOPHAGOGASTRODUODENOSCOPY (EGD) WITH PROPOFOL;  Surgeon: [name], MD;  Location: [place];  Service: Endoscopy;  Laterality: N/A;  . FOOT SURGERY Right   . GANGLION CYST EXCISION Left   . NASAL SINUS SURGERY    . NEUROMA SURGERY Left   . PARTIAL HYSTERECTOMY  1996   +HPV and cervical dysplasia  . PLACEMENT OF BREAST IMPLANTS    . POLYPECTOMY  06/20/2016   Procedure: POLYPECTOMY;  Surgeon: [name], MD;  Location: [place];  Service: Endoscopy;;  . REPLACEMENT TOTAL KNEE Right 06/2014  . TONSILLECTOMY      Social History   Tobacco Use  .  Smoking status: Never Smoker  . Smokeless tobacco: Never Used  Substance Use Topics  . Alcohol use: No    Alcohol/week: 0.0 oz  . Drug use: No     Medication list has been reviewed and updated.  PHQ 2/9 Scores 05/01/2017 01/31/2016  PHQ - 2 Score 0 0    Physical Exam  Constitutional: She is oriented to person, place, and time. She appears well-developed and well-nourished.  HENT:  Right Ear: External ear and ear canal normal. Tympanic membrane is not erythematous and not retracted.  Left Ear: External ear and ear canal normal. Tympanic membrane is not erythematous and not retracted.  Nose: Right sinus exhibits maxillary sinus tenderness. Right sinus exhibits no frontal sinus tenderness. Left sinus exhibits maxillary sinus tenderness. Left sinus exhibits no frontal sinus tenderness.  Mouth/Throat: Uvula is midline and mucous membranes are normal. No oral lesions. Posterior oropharyngeal erythema present. No oropharyngeal exudate.  Eyes: EOM are normal. Pupils are equal, round, and reactive to light. Right eye exhibits chemosis. Left eye exhibits chemosis. Right conjunctiva has no  hemorrhage. Left conjunctiva has no hemorrhage.  Cardiovascular: Normal rate, regular rhythm and normal heart sounds.  Pulmonary/Chest: Breath sounds normal. She has no wheezes. She has no rales.  Lymphadenopathy:    She has no cervical adenopathy.  Neurological: She is alert and oriented to person, place, and time.    BP 122/80   Pulse (!) 56   Temp 98.4 F (36.9 C) (Oral)   Ht 5\' 5"  (1.651 m)   Wt 187 lb (84.8 kg)   SpO2 99%   BMI 31.12 kg/m   Assessment and Plan: 1. Acute non-recurrent maxillary sinusitis Continue over the counter cold medication Resume Flonase NS - amoxicillin-clavulanate (AUGMENTIN) 875-125 MG tablet; Take 1 tablet by mouth 2 (two) times daily for 10 days.  Dispense: 20 tablet; Refill: 0  2. Acute conjunctivitis of both eyes, unspecified acute conjunctivitis type -  neomycin-polymyxin b-dexamethasone (MAXITROL) 3.5-10000-0.1 SUSP; Place 2 drops into both eyes every 6 (six) hours.  Dispense: 5 mL; Refill: 0   Meds ordered this encounter  Medications  . amoxicillin-clavulanate (AUGMENTIN) 875-125 MG tablet    Sig: Take 1 tablet by mouth 2 (two) times daily for 10 days.    Dispense:  20 tablet    Refill:  0  . neomycin-polymyxin b-dexamethasone (MAXITROL) 3.5-10000-0.1 SUSP    Sig: Place 2 drops into both eyes every 6 (six) hours.    Dispense:  5 mL    Refill:  0    Partially dictated using Editor, commissioning. Any errors are unintentional.  Halina Maidens, MD [facility]  05/01/2017

## 2017-05-05 ENCOUNTER — Encounter: Payer: Medicare Other | Admitting: Internal Medicine

## 2017-05-12 ENCOUNTER — Encounter: Payer: Medicare Other | Admitting: Internal Medicine

## 2017-05-12 ENCOUNTER — Other Ambulatory Visit: Payer: Self-pay | Admitting: Internal Medicine

## 2017-05-12 DIAGNOSIS — I10 Essential (primary) hypertension: Secondary | ICD-10-CM

## 2017-05-12 MED ORDER — HYDROCORTISONE 2.5 % EX CREA: TOPICAL_CREAM | Freq: Two times a day (BID) | CUTANEOUS | 1 refills | Status: DC

## 2017-05-12 MED ORDER — FUROSEMIDE 40 MG PO TABS: 40.0000 mg | ORAL_TABLET | Freq: Every day | ORAL | 3 refills | Status: DC

## 2017-05-21 ENCOUNTER — Encounter: Payer: Self-pay | Admitting: Internal Medicine

## 2017-05-21 ENCOUNTER — Ambulatory Visit (INDEPENDENT_AMBULATORY_CARE_PROVIDER_SITE_OTHER): Payer: Medicare Other | Admitting: Internal Medicine

## 2017-05-21 VITALS — BP 122/68 | HR 68 | Ht 65.0 in | Wt 186.8 lb

## 2017-05-21 DIAGNOSIS — I10 Essential (primary) hypertension: Secondary | ICD-10-CM | POA: Diagnosis not present

## 2017-05-21 DIAGNOSIS — K279 Peptic ulcer, site unspecified, unspecified as acute or chronic, without hemorrhage or perforation: Secondary | ICD-10-CM | POA: Diagnosis not present

## 2017-05-21 DIAGNOSIS — E782 Mixed hyperlipidemia: Secondary | ICD-10-CM | POA: Diagnosis not present

## 2017-05-21 DIAGNOSIS — M797 Fibromyalgia: Secondary | ICD-10-CM

## 2017-05-21 DIAGNOSIS — Z1159 Encounter for screening for other viral diseases: Secondary | ICD-10-CM

## 2017-05-21 DIAGNOSIS — M3500 Sicca syndrome, unspecified: Secondary | ICD-10-CM

## 2017-05-21 DIAGNOSIS — G43009 Migraine without aura, not intractable, without status migrainosus: Secondary | ICD-10-CM | POA: Diagnosis not present

## 2017-05-21 DIAGNOSIS — Z0000 Encounter for general adult medical examination without abnormal findings: Secondary | ICD-10-CM | POA: Diagnosis not present

## 2017-05-21 LAB — POCT URINALYSIS DIPSTICK
BILIRUBIN UA: NEGATIVE
GLUCOSE UA: NEGATIVE
KETONES UA: NEGATIVE
Leukocytes, UA: NEGATIVE
Nitrite, UA: NEGATIVE
PH UA: 6 (ref 5.0–8.0)
Protein, UA: NEGATIVE
RBC UA: NEGATIVE
Spec Grav, UA: 1.02 (ref 1.010–1.025)
UROBILINOGEN UA: 0.2 U/dL

## 2017-05-21 MED ORDER — PROMETHAZINE HCL 25 MG PO TABS: 25.0000 mg | ORAL_TABLET | Freq: Three times a day (TID) | ORAL | 0 refills | Status: DC | PRN

## 2017-05-21 NOTE — Progress Notes (Signed)
Date:  05/21/2017   Name:  Ashley Glover   DOB:  [birth year]   MRN:  [id number]   Chief Complaint: Annual Exam (Just had mammo- normal negative. Declined breast exam. ) Ashley Glover is a 56 y.o. female who presents today for her Complete Annual Exam. She feels fairly well. She reports exercising very little. She reports she is sleeping fairly well. Recently had a mammogram.  No breast issues other than encapsulated hard implants done 30 years ago.  Hypertension  This is a chronic problem. The problem is unchanged. The problem is controlled. Associated symptoms include headaches. Pertinent negatives include no chest pain, palpitations or shortness of breath.  Hyperlipidemia  This is a chronic problem. The problem is controlled. Associated symptoms include myalgias. Pertinent negatives include no chest pain or shortness of breath. Current antihyperlipidemic treatment includes statins.  Gastroesophageal Reflux  She complains of heartburn. She reports no abdominal pain, no chest pain, no coughing or no wheezing. This is a recurrent problem. The problem occurs occasionally. Associated symptoms include fatigue. Risk factors: hx of gastric ulcers. She has tried a PPI for the symptoms. Past procedures include an EGD.  Fibromyalgia - stable on gabapentin  Sjogrens syndrome - stable sx.    Review of Systems  Constitutional: Positive for fatigue. Negative for chills and fever.  HENT: Negative for congestion, hearing loss, tinnitus, trouble swallowing and voice change.   Eyes: Negative for visual disturbance.  Respiratory: Negative for cough, chest tightness, shortness of breath and wheezing.   Cardiovascular: Negative for chest pain, palpitations and leg swelling.  Gastrointestinal: Positive for heartburn. Negative for abdominal pain, constipation, diarrhea and vomiting.  Endocrine: Negative for polydipsia and polyuria.  Genitourinary: Negative for dysuria, frequency, genital sores, vaginal  bleeding and vaginal discharge.  Musculoskeletal: Positive for arthralgias and myalgias. Negative for gait problem and joint swelling.  Skin: Negative for color change and rash.  Neurological: Positive for headaches. Negative for dizziness, tremors and light-headedness.  Hematological: Negative for adenopathy. Does not bruise/bleed easily.  Psychiatric/Behavioral: Negative for dysphoric mood and sleep disturbance. The patient is not nervous/anxious.     Patient Active Problem List   Diagnosis Date Noted  . Not currently working due to disabled status 01/24/2017  . Degenerative arthritis 01/24/2017  . Frequent PVCs 08/20/2016  . Benign neoplasm of ascending colon   . Benign neoplasm of cecum   . Hx of colonic polyps 04/24/2016  . Periodic headache syndrome, not intractable 01/31/2016  . Rosacea, acne 01/31/2016  . Essential hypertension 08/02/2015  . Hypothyroidism due to acquired atrophy of thyroid 08/02/2015  . Fibromyalgia 08/02/2015  . Sjogren's syndrome ([place]) 08/02/2015  . Peptic ulcer disease 08/02/2015  . Primary osteoarthritis of both knees 08/02/2015  . Osteopenia 08/02/2015  . Other allergic rhinitis 08/02/2015  . Hyperlipidemia, mixed 08/02/2015  . Hx of abnormal cervical Pap smear 08/02/2015    Prior to Admission medications   Medication Sig Start Date End Date Taking? Authorizing Provider  butalbital-acetaminophen-caffeine (FIORICET WITH CODEINE) 50-325-40-30 MG capsule Take 1 capsule by mouth every 4 (four) hours as needed for headache. 03/04/17  Yes Glean Hess, MD  calcium-vitamin D (OSCAL WITH D) 250-125 MG-UNIT tablet Take 1 tablet by mouth daily.   Yes [provider]  furosemide (LASIX) 40 MG tablet Take 1 tablet (40 mg total) by mouth daily. 05/12/17  Yes Glean Hess, MD  gabapentin (NEURONTIN) 600 MG tablet Take 1 tablet (600 mg total) by mouth 3 (three) times daily.  03/04/17  Yes Glean Hess, MD  hydrocortisone 2.5 % cream Apply  topically 2 (two) times daily. 05/12/17  Yes Glean Hess, MD  ibuprofen (ADVIL,MOTRIN) 200 MG tablet Take 400 mg by mouth every 6 (six) hours as needed.   Yes [provider]  isometheptene-acetaminophen-dichloralphenazone (MIDRIN) 65-100-325 MG capsule Take 1 capsule by mouth 4 (four) times daily as needed for migraine. Maximum 5 capsules in 12 hours for migraine headaches, 8 capsules in 24 hours for tension headaches.   Yes [provider]  levothyroxine (SYNTHROID, LEVOTHROID) 88 MCG tablet TAKE 1 TABLET BY MOUTH  DAILY BEFORE BREAKFAST 10/30/16  Yes Glean Hess, MD  lovastatin (MEVACOR) 40 MG tablet Take 1 tablet (40 mg total) by mouth at bedtime. 10/30/16  Yes Glean Hess, MD  montelukast (SINGULAIR) 10 MG tablet Take 1 tablet (10 mg total) by mouth at bedtime. 10/30/16  Yes Glean Hess, MD  neomycin-polymyxin b-dexamethasone (MAXITROL) 3.5-10000-0.1 SUSP Place 2 drops into both eyes every 6 (six) hours. 05/01/17  Yes Glean Hess, MD  neomycin-polymyxin-hydrocortisone (CORTISPORIN) OTIC solution Place 3 drops into both ears 4 (four) times daily. 01/24/17  Yes Glean Hess, MD  omeprazole (PRILOSEC) 40 MG capsule Take 1 capsule (40 mg total) by mouth daily. 10/30/16  Yes Glean Hess, MD  promethazine (PHENERGAN) 25 MG tablet Take 1 tablet (25 mg total) by mouth every 8 (eight) hours as needed for nausea or vomiting. 04/25/16  Yes Glean Hess, MD  propranolol (INDERAL) 40 MG tablet Take 1 tablet (40 mg total) by mouth 2 (two) times daily. 10/30/16  Yes Glean Hess, MD  tiZANidine (ZANAFLEX) 4 MG tablet Take 1 tablet (4 mg total) by mouth 3 (three) times daily. 10/30/16  Yes Glean Hess, MD  traMADol (ULTRAM) 50 MG tablet Take 50 mg by mouth every 6 (six) hours as needed.   Yes [provider]  HYDROcodone-acetaminophen (NORCO) 7.5-325 MG tablet Take 1 tablet by mouth every 6 (six) hours as needed for moderate pain.    [provider]    Allergies  Allergen Reactions  . Morphine And Related Nausea Only  . Elwyn Reach Hcl]     Past Surgical History:  Procedure Laterality Date  . AUGMENTATION MAMMAPLASTY Bilateral 1987  . COLONOSCOPY  05/2012   polyps  . COLONOSCOPY WITH PROPOFOL N/A 06/20/2016   Procedure: COLONOSCOPY WITH PROPOFOL;  Surgeon: [name], MD;  Location: [place];  Service: Endoscopy;  Laterality: N/A;  . ESOPHAGOGASTRODUODENOSCOPY (EGD) WITH PROPOFOL N/A 06/20/2016   Procedure: ESOPHAGOGASTRODUODENOSCOPY (EGD) WITH PROPOFOL;  Surgeon: [name], MD;  Location: [place];  Service: Endoscopy;  Laterality: N/A;  . FOOT SURGERY Right   . GANGLION CYST EXCISION Left   . NASAL SINUS SURGERY    . NEUROMA SURGERY Left   . PARTIAL HYSTERECTOMY  1996   +HPV and cervical dysplasia  . PLACEMENT OF BREAST IMPLANTS    . POLYPECTOMY  06/20/2016   Procedure: POLYPECTOMY;  Surgeon: [name], MD;  Location: [place];  Service: Endoscopy;;  . REPLACEMENT TOTAL KNEE Right 06/2014  . TONSILLECTOMY      Social History   Tobacco Use  . Smoking status: Never Smoker  . Smokeless tobacco: Never Used  Substance Use Topics  . Alcohol use: No    Alcohol/week: 0.0 oz  . Drug use: No     Medication list has been reviewed and updated.  PHQ 2/9 Scores 05/01/2017 01/31/2016  PHQ - 2 Score 0 0    Physical Exam  Constitutional: She is oriented to person, place, and time. She appears well-developed and well-nourished. No distress.  HENT:  Head: Normocephalic and atraumatic.  Right Ear: Tympanic membrane and ear canal normal.  Left Ear: Tympanic membrane and ear canal normal.  Nose: Right sinus exhibits no maxillary sinus tenderness. Left sinus exhibits no maxillary sinus tenderness.  Mouth/Throat: Uvula is midline and oropharynx is clear and moist.  Eyes: Conjunctivae and EOM are normal. Right eye exhibits no discharge. Left eye exhibits no discharge. No  scleral icterus.  Neck: Normal range of motion. Carotid bruit is not present. No erythema present. No thyromegaly present.  Cardiovascular: Normal rate, regular rhythm, normal heart sounds and normal pulses.  Pulmonary/Chest: Effort normal. No respiratory distress. She has no wheezes.  Breast implants firm  Abdominal: Soft. Bowel sounds are normal. There is no hepatosplenomegaly. There is no tenderness. There is no CVA tenderness.  Musculoskeletal: She exhibits no edema or tenderness.       Right knee: She exhibits decreased range of motion and swelling. She exhibits no effusion.  Lymphadenopathy:    She has no cervical adenopathy.  Neurological: She is alert and oriented to person, place, and time. She has normal reflexes. No cranial nerve deficit or sensory deficit.  Skin: Skin is warm, dry and intact. No rash noted.  Psychiatric: She has a normal mood and affect. Her speech is normal and behavior is normal. Thought content normal. Cognition and memory are normal.  Nursing note and vitals reviewed.   BP 122/68   Pulse 68   Ht 5\' 5"  (1.651 m)   Wt 186 lb 12.8 oz (84.7 kg)   SpO2 100%   BMI 31.09 kg/m   Assessment and Plan: 1. Annual physical exam Work on exercise regimen Encapsulated breast implants - pt will call when she is ready for plastic surgery consultation - TSH - POCT urinalysis dipstick  2. Essential hypertension controlled - CBC with Differential/Platelet - Comprehensive metabolic panel  3. Sjogren's syndrome without extraglandular involvement ([place]) stable  4. Peptic ulcer disease Continue PPI Recent EGD normal - CBC with Differential/Platelet  5. Hyperlipidemia, mixed On statin therapy - Lipid panel  6. Fibromyalgia On gabapentin  7. Migraine without aura and without status migrainosus, not intractable - promethazine (PHENERGAN) 25 MG tablet; Take 1 tablet (25 mg total) by mouth every 8 (eight) hours as needed for nausea or vomiting.  Dispense: 60  tablet; Refill: 0  8. Need for hepatitis C screening test - Hepatitis C antibody   Meds ordered this encounter  Medications  . promethazine (PHENERGAN) 25 MG tablet    Sig: Take 1 tablet (25 mg total) by mouth every 8 (eight) hours as needed for nausea or vomiting.    Dispense:  60 tablet    Refill:  0    Partially dictated using Editor, commissioning. Any errors are unintentional.  Halina Maidens, MD [facility]  05/21/2017

## 2017-05-22 LAB — CBC WITH DIFFERENTIAL/PLATELET
BASOS: 1 %
Basophils Absolute: 0 10*3/uL (ref 0.0–0.2)
EOS (ABSOLUTE): 0.2 10*3/uL (ref 0.0–0.4)
EOS: 3 %
HEMATOCRIT: 42.7 % (ref 34.0–46.6)
HEMOGLOBIN: 14.3 g/dL (ref 11.1–15.9)
IMMATURE GRANULOCYTES: 0 %
Immature Grans (Abs): 0 10*3/uL (ref 0.0–0.1)
Lymphocytes Absolute: 2.2 10*3/uL (ref 0.7–3.1)
Lymphs: 27 %
MCH: 29.4 pg (ref 26.6–33.0)
MCHC: 33.5 g/dL (ref 31.5–35.7)
MCV: 88 fL (ref 79–97)
MONOCYTES: 7 %
MONOS ABS: 0.6 10*3/uL (ref 0.1–0.9)
NEUTROS PCT: 62 %
Neutrophils Absolute: 4.9 10*3/uL (ref 1.4–7.0)
Platelets: 275 10*3/uL (ref 150–379)
RBC: 4.87 x10E6/uL (ref 3.77–5.28)
RDW: 14.2 % (ref 12.3–15.4)
WBC: 7.9 10*3/uL (ref 3.4–10.8)

## 2017-05-22 LAB — COMPREHENSIVE METABOLIC PANEL
A/G RATIO: 1.6 (ref 1.2–2.2)
ALBUMIN: 4.6 g/dL (ref 3.5–5.5)
ALT: 27 IU/L (ref 0–32)
AST: 22 IU/L (ref 0–40)
Alkaline Phosphatase: 100 IU/L (ref 39–117)
BUN / CREAT RATIO: 13 (ref 9–23)
BUN: 12 mg/dL (ref 6–24)
Bilirubin Total: 0.2 mg/dL (ref 0.0–1.2)
CALCIUM: 9.6 mg/dL (ref 8.7–10.2)
CO2: 26 mmol/L (ref 20–29)
CREATININE: 0.89 mg/dL (ref 0.57–1.00)
Chloride: 99 mmol/L (ref 96–106)
GFR, EST AFRICAN AMERICAN: 83 mL/min/{1.73_m2} (ref >59)
GFR, EST NON AFRICAN AMERICAN: 72 mL/min/{1.73_m2} (ref >59)
GLOBULIN, TOTAL: 2.9 g/dL (ref 1.5–4.5)
Glucose: 81 mg/dL (ref 65–99)
POTASSIUM: 5 mmol/L (ref 3.5–5.2)
SODIUM: 141 mmol/L (ref 134–144)
TOTAL PROTEIN: 7.5 g/dL (ref 6.0–8.5)

## 2017-05-22 LAB — LIPID PANEL
CHOL/HDL RATIO: 2.3 ratio (ref 0.0–4.4)
Cholesterol, Total: 175 mg/dL (ref 100–199)
HDL: 75 mg/dL (ref >39)
LDL CALC: 81 mg/dL (ref 0–99)
Triglycerides: 93 mg/dL (ref 0–149)
VLDL Cholesterol Cal: 19 mg/dL (ref 5–40)

## 2017-05-22 LAB — TSH: TSH: 4.13 u[IU]/mL (ref 0.450–4.500)

## 2017-05-22 LAB — HEPATITIS C ANTIBODY

## 2017-07-19 ENCOUNTER — Other Ambulatory Visit: Payer: Self-pay | Admitting: Internal Medicine

## 2017-07-19 DIAGNOSIS — M797 Fibromyalgia: Secondary | ICD-10-CM

## 2017-07-19 DIAGNOSIS — E034 Atrophy of thyroid (acquired): Secondary | ICD-10-CM

## 2017-07-19 DIAGNOSIS — J3089 Other allergic rhinitis: Secondary | ICD-10-CM

## 2017-07-19 DIAGNOSIS — K279 Peptic ulcer, site unspecified, unspecified as acute or chronic, without hemorrhage or perforation: Secondary | ICD-10-CM

## 2017-07-19 DIAGNOSIS — E782 Mixed hyperlipidemia: Secondary | ICD-10-CM

## 2017-07-19 DIAGNOSIS — I10 Essential (primary) hypertension: Secondary | ICD-10-CM

## 2017-08-18 DIAGNOSIS — M79672 Pain in left foot: Secondary | ICD-10-CM | POA: Diagnosis not present

## 2017-08-18 DIAGNOSIS — R262 Difficulty in walking, not elsewhere classified: Secondary | ICD-10-CM | POA: Diagnosis not present

## 2017-09-30 ENCOUNTER — Ambulatory Visit (INDEPENDENT_AMBULATORY_CARE_PROVIDER_SITE_OTHER): Payer: Medicare Other | Admitting: Internal Medicine

## 2017-09-30 ENCOUNTER — Encounter: Payer: Self-pay | Admitting: Internal Medicine

## 2017-09-30 VITALS — BP 117/77 | HR 78 | Resp 16 | Ht 65.0 in | Wt 187.0 lb

## 2017-09-30 DIAGNOSIS — S40022A Contusion of left upper arm, initial encounter: Secondary | ICD-10-CM | POA: Diagnosis not present

## 2017-09-30 DIAGNOSIS — H5789 Other specified disorders of eye and adnexa: Secondary | ICD-10-CM

## 2017-09-30 DIAGNOSIS — H6690 Otitis media, unspecified, unspecified ear: Secondary | ICD-10-CM | POA: Insufficient documentation

## 2017-09-30 HISTORY — DX: Other specified disorders of eye and adnexa: H57.89

## 2017-09-30 HISTORY — DX: Otitis media, unspecified, unspecified ear: H66.90

## 2017-09-30 NOTE — Patient Instructions (Signed)
Call Eye doctor to get appointment as soon as possible.  [facility] referral placed

## 2017-09-30 NOTE — Progress Notes (Signed)
Date:  09/30/2017   Name:  Ashley Glover   DOB:  05-Jun-1960   MRN:  [id number]   Chief Complaint: Eye Problem (L Eye and R Eye Drainage since Aug 2018 ); Ear Pain (L Ear and R Ear pains and draining since Aug 2018. ); and Arm Problem (Lump with huge hematoma on left arm since 2 weeks ago NO INJURY )  Eye Problem   Both eyes are affected.This is a recurrent problem. The problem occurs intermittently. The problem has been waxing and waning. There was no injury mechanism. Associated symptoms include an eye discharge, eye redness and itching.  Otalgia   There is pain in both ears. This is a recurrent problem. The problem has been waxing and waning. There has been no fever. Associated symptoms include ear discharge and rhinorrhea. Pertinent negatives include no rash or sore throat.   Bruise - she found a bruise in her inner left upper arm about 2 weeks ago.  It has started to fade but then she noticed a firm knot in the center - it is not tender.   Review of Systems  Constitutional: Negative for chills, fatigue and unexpected weight change.  HENT: Positive for ear discharge, ear pain, postnasal drip, rhinorrhea and sinus pressure. Negative for sore throat.   Eyes: Positive for discharge, redness and itching.  Respiratory: Negative for chest tightness, shortness of breath and wheezing.   Cardiovascular: Negative for chest pain and palpitations.  Skin: Positive for color change. Negative for rash and wound.  Hematological: Bruises/bleeds easily.    Patient Active Problem List   Diagnosis Date Noted  . Migraine without aura and without status migrainosus, not intractable 05/21/2017  . Not currently working due to disabled status 01/24/2017  . Degenerative arthritis 01/24/2017  . Frequent PVCs 08/20/2016  . Benign neoplasm of ascending colon   . Benign neoplasm of cecum   . Hx of colonic polyps 04/24/2016  . Periodic headache syndrome, not intractable 01/31/2016  . Rosacea, acne  01/31/2016  . Essential hypertension 08/02/2015  . Hypothyroidism due to acquired atrophy of thyroid 08/02/2015  . Fibromyalgia 08/02/2015  . Sjogren's syndrome ([place]) 08/02/2015  . Peptic ulcer disease 08/02/2015  . Primary osteoarthritis of both knees 08/02/2015  . Osteopenia 08/02/2015  . Other allergic rhinitis 08/02/2015  . Hyperlipidemia, mixed 08/02/2015  . Hx of abnormal cervical Pap smear 08/02/2015    Prior to Admission medications   Medication Sig Start Date End Date Taking? Authorizing Provider  butalbital-acetaminophen-caffeine (FIORICET WITH CODEINE) 50-325-40-30 MG capsule Take 1 capsule by mouth every 4 (four) hours as needed for headache. 03/04/17  Yes Glean Hess, MD  calcium-vitamin D (OSCAL WITH D) 250-125 MG-UNIT tablet Take 1 tablet by mouth daily.   Yes [provider]  furosemide (LASIX) 40 MG tablet Take 1 tablet (40 mg total) by mouth daily. 05/12/17  Yes Glean Hess, MD  gabapentin (NEURONTIN) 600 MG tablet Take 1 tablet (600 mg total) by mouth 3 (three) times daily. 03/04/17  Yes Glean Hess, MD  hydrocortisone 2.5 % cream APPLY TWO TIMES DAILY 07/19/17  Yes Glean Hess, MD  ibuprofen (ADVIL,MOTRIN) 200 MG tablet Take 400 mg by mouth every 6 (six) hours as needed.   Yes [provider]  isometheptene-acetaminophen-dichloralphenazone (MIDRIN) 65-100-325 MG capsule Take 1 capsule by mouth 4 (four) times daily as needed for migraine. Maximum 5 capsules in 12 hours for migraine headaches, 8 capsules in 24 hours for tension headaches.  Yes [provider]  levothyroxine (SYNTHROID, LEVOTHROID) 88 MCG tablet TAKE 1 TABLET BY MOUTH  DAILY BEFORE BREAKFAST 07/19/17  Yes Glean Hess, MD  loratadine (CLARITIN) 10 MG tablet Take 10 mg by mouth daily.   Yes [provider]  lovastatin (MEVACOR) 40 MG tablet TAKE 1 TABLET BY MOUTH AT  BEDTIME 07/19/17  Yes Glean Hess, MD  montelukast (SINGULAIR) 10 MG tablet  TAKE 1 TABLET BY MOUTH AT  BEDTIME 07/19/17  Yes Glean Hess, MD  neomycin-polymyxin b-dexamethasone (MAXITROL) 3.5-10000-0.1 SUSP Place 2 drops into both eyes every 6 (six) hours. 05/01/17  Yes Glean Hess, MD  neomycin-polymyxin-hydrocortisone (CORTISPORIN) OTIC solution Place 3 drops into both ears 4 (four) times daily. 01/24/17  Yes Glean Hess, MD  omeprazole (PRILOSEC) 40 MG capsule TAKE 1 CAPSULE BY MOUTH  DAILY 07/19/17  Yes Glean Hess, MD  promethazine (PHENERGAN) 25 MG tablet Take 1 tablet (25 mg total) by mouth every 8 (eight) hours as needed for nausea or vomiting. 05/21/17  Yes Glean Hess, MD  propranolol (INDERAL) 40 MG tablet TAKE 1 TABLET BY MOUTH TWO  TIMES DAILY 07/19/17  Yes Glean Hess, MD  tiZANidine (ZANAFLEX) 4 MG tablet TAKE 1 TABLET BY MOUTH 3  TIMES DAILY 07/19/17  Yes Glean Hess, MD  traMADol (ULTRAM) 50 MG tablet Take 50 mg by mouth every 6 (six) hours as needed.    [provider]    Allergies  Allergen Reactions  . Morphine And Related Nausea Only  . Elwyn Reach Hcl]     Past Surgical History:  Procedure Laterality Date  . AUGMENTATION MAMMAPLASTY Bilateral 1987  . COLONOSCOPY  05/2012   polyps  . COLONOSCOPY WITH PROPOFOL N/A 06/20/2016   Procedure: COLONOSCOPY WITH PROPOFOL;  Surgeon: [name], MD;  Location: [place];  Service: Endoscopy;  Laterality: N/A;  . ESOPHAGOGASTRODUODENOSCOPY (EGD) WITH PROPOFOL N/A 06/20/2016   Procedure: ESOPHAGOGASTRODUODENOSCOPY (EGD) WITH PROPOFOL;  Surgeon: [name], MD;  Location: [place];  Service: Endoscopy;  Laterality: N/A;  . FOOT SURGERY Right   . GANGLION CYST EXCISION Left   . NASAL SINUS SURGERY    . NEUROMA SURGERY Left   . PARTIAL HYSTERECTOMY  1996   +HPV and cervical dysplasia  . PLACEMENT OF BREAST IMPLANTS    . POLYPECTOMY  06/20/2016   Procedure: POLYPECTOMY;  Surgeon: [name], MD;  Location: [place];   Service: Endoscopy;;  . REPLACEMENT TOTAL KNEE Right 06/2014  . TONSILLECTOMY      Social History   Tobacco Use  . Smoking status: Never Smoker  . Smokeless tobacco: Never Used  Substance Use Topics  . Alcohol use: No    Alcohol/week: 0.0 oz  . Drug use: No     Medication list has been reviewed and updated.  PHQ 2/9 Scores 05/01/2017 01/31/2016  PHQ - 2 Score 0 0    Physical Exam  Constitutional: She is oriented to person, place, and time. She appears well-developed. No distress.  HENT:  Head: Normocephalic and atraumatic.  Right Ear: Hearing normal. Tympanic membrane is scarred. A middle ear effusion is present.  Left Ear: Hearing normal. Tympanic membrane is scarred. A middle ear effusion is present.  Scaling noted in both external canals  Eyes: Pupils are equal, round, and reactive to light. EOM are normal. Right eye exhibits chemosis. Right eye exhibits no discharge. Left eye exhibits chemosis. Left eye exhibits no discharge.  Pulmonary/Chest: Effort normal.  No respiratory distress.  Musculoskeletal: Normal range of motion.  Neurological: She is alert and oriented to person, place, and time.  Skin: Skin is warm and dry. No rash noted.     Psychiatric: She has a normal mood and affect. Her behavior is normal. Thought content normal.    BP 117/77   Pulse 78   Resp 16   Ht 5\' 5"  (1.651 m)   Wt 187 lb (84.8 kg)   SpO2 98%   BMI 31.12 kg/m   Assessment and Plan: 1. Chronic otitis media, unspecified otitis media type Concern for persistent sx - will hold off on prescription drops since minimally sx - Ambulatory referral to ENT  2. Redness of both eyes May be more than allergies since persistent Pt will schedule with [facility] as soon as possible  3. Traumatic ecchymosis of left upper arm, initial encounter Pt reassured - should resolve without difficulty   No orders of the defined types were placed in this encounter.   Partially dictated using Radio producer. Any errors are unintentional.  Halina Maidens, MD [facility]  09/30/2017

## 2017-10-06 ENCOUNTER — Other Ambulatory Visit: Payer: Self-pay | Admitting: Internal Medicine

## 2017-11-03 DIAGNOSIS — M67371 Transient synovitis, right ankle and foot: Secondary | ICD-10-CM | POA: Diagnosis not present

## 2017-11-03 DIAGNOSIS — M1712 Unilateral primary osteoarthritis, left knee: Secondary | ICD-10-CM | POA: Diagnosis not present

## 2017-11-03 DIAGNOSIS — M67372 Transient synovitis, left ankle and foot: Secondary | ICD-10-CM | POA: Diagnosis not present

## 2017-11-19 ENCOUNTER — Ambulatory Visit: Payer: Medicare Other | Admitting: Internal Medicine

## 2017-11-21 ENCOUNTER — Encounter: Payer: Self-pay | Admitting: Internal Medicine

## 2017-11-21 ENCOUNTER — Ambulatory Visit (INDEPENDENT_AMBULATORY_CARE_PROVIDER_SITE_OTHER): Payer: Medicare Other | Admitting: Internal Medicine

## 2017-11-21 VITALS — BP 110/76 | HR 65 | Temp 98.8°F | Resp 16 | Ht 65.0 in | Wt 187.0 lb

## 2017-11-21 DIAGNOSIS — G43C0 Periodic headache syndromes in child or adult, not intractable: Secondary | ICD-10-CM

## 2017-11-21 DIAGNOSIS — E034 Atrophy of thyroid (acquired): Secondary | ICD-10-CM | POA: Diagnosis not present

## 2017-11-21 DIAGNOSIS — I10 Essential (primary) hypertension: Secondary | ICD-10-CM

## 2017-11-21 DIAGNOSIS — G43009 Migraine without aura, not intractable, without status migrainosus: Secondary | ICD-10-CM | POA: Diagnosis not present

## 2017-11-21 DIAGNOSIS — M159 Polyosteoarthritis, unspecified: Secondary | ICD-10-CM | POA: Diagnosis not present

## 2017-11-21 DIAGNOSIS — L719 Rosacea, unspecified: Secondary | ICD-10-CM | POA: Diagnosis not present

## 2017-11-21 MED ORDER — MINOCYCLINE HCL 50 MG PO CAPS: 50.0000 mg | ORAL_CAPSULE | Freq: Two times a day (BID) | ORAL | 5 refills | Status: DC

## 2017-11-21 NOTE — Patient Instructions (Signed)
Cymbalta (duloxetine) 60 mg once a day

## 2017-11-21 NOTE — Progress Notes (Signed)
Date:  11/21/2017   Name:  Ashley Glover   DOB:  [birth year]   MRN:  [id number]   Chief Complaint: Hypertension; Migraine; and Arthritis Hypertension  This is a chronic problem. The problem has been waxing and waning (often has high diastolic readings) since onset. The problem is controlled. Associated symptoms include neck pain. Pertinent negatives include no chest pain, headaches, palpitations or shortness of breath. There are no associated agents to hypertension. There are no known risk factors for coronary artery disease. Past treatments include diuretics and beta blockers. The current treatment provides significant improvement. Identifiable causes of hypertension include a thyroid problem.  Thyroid Problem  Presents for follow-up visit. Symptoms include fatigue. Patient reports no leg swelling, palpitations or weight gain. The symptoms have been stable.  Migraine   This is a chronic problem. The problem occurs daily. The problem has been unchanged. The pain quality is similar to prior headaches. The quality of the pain is described as shooting, stabbing and squeezing. The pain is moderate. Associated symptoms include neck pain, phonophobia and photophobia. Pertinent negatives include no fever, vomiting or weakness. Treatments tried: had testing by neurology years ago - tried occiptal trigger point injections with no benefit.  uses Fioricet about 1-2 times per week but had HA daily.  Has never tried one of the newer agents. Her past medical history is significant for hypertension.  Joint pain - on gabapentin, taking intermittent Advil.  Tramadol has not helped.  She took cymbalta in the past and would like to try it again.  Lab Results  Component Value Date   TSH 4.130 05/21/2017   Lab Results  Component Value Date   CREATININE 0.89 05/21/2017   BUN 12 05/21/2017   NA 141 05/21/2017   K 5.0 05/21/2017   CL 99 05/21/2017   CO2 26 05/21/2017      Review of Systems    Constitutional: Positive for fatigue. Negative for chills, fever, unexpected weight change and weight gain.  HENT: Negative for trouble swallowing.   Eyes: Positive for photophobia.  Respiratory: Negative for chest tightness, shortness of breath and wheezing.   Cardiovascular: Negative for chest pain, palpitations and leg swelling.  Gastrointestinal: Negative for vomiting.  Genitourinary: Negative for dysuria.  Musculoskeletal: Positive for arthralgias, myalgias and neck pain.  Neurological: Negative for syncope, weakness and headaches.  Psychiatric/Behavioral: Positive for sleep disturbance (chronic). Negative for agitation, decreased concentration, dysphoric mood and hallucinations.    Patient Active Problem List   Diagnosis Date Noted  . Chronic otitis media 09/30/2017  . Redness of both eyes 09/30/2017  . Migraine without aura and without status migrainosus, not intractable 05/21/2017  . Not currently working due to disabled status 01/24/2017  . Degenerative arthritis 01/24/2017  . Frequent PVCs 08/20/2016  . Benign neoplasm of ascending colon   . Benign neoplasm of cecum   . Hx of colonic polyps 04/24/2016  . Periodic headache syndrome, not intractable 01/31/2016  . Rosacea, acne 01/31/2016  . Essential hypertension 08/02/2015  . Hypothyroidism due to acquired atrophy of thyroid 08/02/2015  . Fibromyalgia 08/02/2015  . Sjogren's syndrome ([place]) 08/02/2015  . Peptic ulcer disease 08/02/2015  . Primary osteoarthritis of both knees 08/02/2015  . Osteopenia 08/02/2015  . Other allergic rhinitis 08/02/2015  . Hyperlipidemia, mixed 08/02/2015  . Hx of abnormal cervical Pap smear 08/02/2015    Prior to Admission medications   Medication Sig Start Date End Date Taking? Authorizing Provider  butalbital-acetaminophen-caffeine (FIORICET WITH CODEINE) 50-325-40-30 MG capsule  Take 1 capsule by mouth every 4 (four) hours as needed for headache. 03/04/17   Glean Hess, MD   calcium-vitamin D (OSCAL WITH D) 250-125 MG-UNIT tablet Take 1 tablet by mouth daily.    [provider]  furosemide (LASIX) 40 MG tablet Take 1 tablet (40 mg total) by mouth daily. 05/12/17   Glean Hess, MD  gabapentin (NEURONTIN) 600 MG tablet TAKE 1 TABLET BY MOUTH 3  TIMES DAILY 10/07/17   Glean Hess, MD  hydrocortisone 2.5 % cream APPLY TWO TIMES DAILY 07/19/17   Glean Hess, MD  ibuprofen (ADVIL,MOTRIN) 200 MG tablet Take 400 mg by mouth every 6 (six) hours as needed.    [provider]  isometheptene-acetaminophen-dichloralphenazone (MIDRIN) 9596410300 MG capsule Take 1 capsule by mouth 4 (four) times daily as needed for migraine. Maximum 5 capsules in 12 hours for migraine headaches, 8 capsules in 24 hours for tension headaches.    [provider]  levothyroxine (SYNTHROID, LEVOTHROID) 88 MCG tablet TAKE 1 TABLET BY MOUTH  DAILY BEFORE BREAKFAST 07/19/17   Glean Hess, MD  loratadine (CLARITIN) 10 MG tablet Take 10 mg by mouth daily.    [provider]  lovastatin (MEVACOR) 40 MG tablet TAKE 1 TABLET BY MOUTH AT  BEDTIME 07/19/17   Glean Hess, MD  montelukast (SINGULAIR) 10 MG tablet TAKE 1 TABLET BY MOUTH AT  BEDTIME 07/19/17   Glean Hess, MD  omeprazole (PRILOSEC) 40 MG capsule TAKE 1 CAPSULE BY MOUTH  DAILY 07/19/17   Glean Hess, MD  promethazine (PHENERGAN) 25 MG tablet Take 1 tablet (25 mg total) by mouth every 8 (eight) hours as needed for nausea or vomiting. 05/21/17   Glean Hess, MD  propranolol (INDERAL) 40 MG tablet TAKE 1 TABLET BY MOUTH TWO  TIMES DAILY 07/19/17   Glean Hess, MD  tiZANidine (ZANAFLEX) 4 MG tablet TAKE 1 TABLET BY MOUTH 3  TIMES DAILY 07/19/17   Glean Hess, MD  traMADol (ULTRAM) 50 MG tablet Take 50 mg by mouth every 6 (six) hours as needed.    [provider]    Allergies  Allergen Reactions  . Morphine And Related Nausea Only  . Elwyn Reach Hcl]      Past Surgical History:  Procedure Laterality Date  . AUGMENTATION MAMMAPLASTY Bilateral 1987  . COLONOSCOPY  05/2012   polyps  . COLONOSCOPY WITH PROPOFOL N/A 06/20/2016   Procedure: COLONOSCOPY WITH PROPOFOL;  Surgeon: [name], MD;  Location: [place];  Service: Endoscopy;  Laterality: N/A;  . ESOPHAGOGASTRODUODENOSCOPY (EGD) WITH PROPOFOL N/A 06/20/2016   Procedure: ESOPHAGOGASTRODUODENOSCOPY (EGD) WITH PROPOFOL;  Surgeon: [name], MD;  Location: [place];  Service: Endoscopy;  Laterality: N/A;  . FOOT SURGERY Right   . GANGLION CYST EXCISION Left   . NASAL SINUS SURGERY    . NEUROMA SURGERY Left   . PARTIAL HYSTERECTOMY  1996   +HPV and cervical dysplasia  . PLACEMENT OF BREAST IMPLANTS    . POLYPECTOMY  06/20/2016   Procedure: POLYPECTOMY;  Surgeon: [name], MD;  Location: [place];  Service: Endoscopy;;  . REPLACEMENT TOTAL KNEE Right 06/2014  . TONSILLECTOMY      Social History   Tobacco Use  . Smoking status: Never Smoker  . Smokeless tobacco: Never Used  Substance Use Topics  . Alcohol use: No    Alcohol/week: 0.0 oz  . Drug use: No     Medication list  has been reviewed and updated.  Current Meds  Medication Sig  . butalbital-acetaminophen-caffeine (FIORICET WITH CODEINE) 50-325-40-30 MG capsule Take 1 capsule by mouth every 4 (four) hours as needed for headache.  . calcium-vitamin D (OSCAL WITH D) 250-125 MG-UNIT tablet Take 1 tablet by mouth daily.  . furosemide (LASIX) 40 MG tablet Take 1 tablet (40 mg total) by mouth daily.  Marland Kitchen gabapentin (NEURONTIN) 600 MG tablet TAKE 1 TABLET BY MOUTH 3  TIMES DAILY  . hydrocortisone 2.5 % cream APPLY TWO TIMES DAILY  . levothyroxine (SYNTHROID, LEVOTHROID) 88 MCG tablet TAKE 1 TABLET BY MOUTH  DAILY BEFORE BREAKFAST  . loratadine (CLARITIN) 10 MG tablet Take 10 mg by mouth daily.  Marland Kitchen lovastatin (MEVACOR) 40 MG tablet TAKE 1 TABLET BY MOUTH AT  BEDTIME  . montelukast (SINGULAIR)  10 MG tablet TAKE 1 TABLET BY MOUTH AT  BEDTIME  . omeprazole (PRILOSEC) 40 MG capsule TAKE 1 CAPSULE BY MOUTH  DAILY  . promethazine (PHENERGAN) 25 MG tablet Take 1 tablet (25 mg total) by mouth every 8 (eight) hours as needed for nausea or vomiting.  . propranolol (INDERAL) 40 MG tablet TAKE 1 TABLET BY MOUTH TWO  TIMES DAILY  . tiZANidine (ZANAFLEX) 4 MG tablet TAKE 1 TABLET BY MOUTH 3  TIMES DAILY  . [DISCONTINUED] isometheptene-acetaminophen-dichloralphenazone (MIDRIN) 65-100-325 MG capsule Take 1 capsule by mouth 4 (four) times daily as needed for migraine. Maximum 5 capsules in 12 hours for migraine headaches, 8 capsules in 24 hours for tension headaches.    PHQ 2/9 Scores 05/01/2017 01/31/2016  PHQ - 2 Score 0 0    Physical Exam  Constitutional: She is oriented to person, place, and time. She appears well-developed. No distress.  HENT:  Head: Normocephalic and atraumatic.  Neck: Carotid bruit is not present.  Cardiovascular: Normal rate, regular rhythm and normal heart sounds.  Pulmonary/Chest: Effort normal and breath sounds normal. No respiratory distress.  Musculoskeletal: She exhibits no edema.  Lymphadenopathy:    She has no cervical adenopathy.  Neurological: She is alert and oriented to person, place, and time.  Skin: Skin is warm and dry. No rash noted.  Psychiatric: She has a normal mood and affect. Her behavior is normal. Thought content normal.  Nursing note and vitals reviewed.   BP 110/76   Pulse 65   Temp 98.8 F (37.1 C) (Oral)   Resp 16   Ht 5\' 5"  (1.651 m)   Wt 187 lb (84.8 kg)   SpO2 97%   BMI 31.12 kg/m   Assessment and Plan: 1. Essential hypertension controlled  2. Hypothyroidism due to acquired atrophy of thyroid supplemented  3. Rosacea - minocycline (MINOCIN,DYNACIN) 50 MG capsule; Take 1 capsule (50 mg total) by mouth 2 (two) times daily.  Dispense: 60 capsule; Refill: 5  4. Migraine without aura and without status migrainosus, not  intractable Continue fioricet - Ambulatory referral to Neurology  5. Periodic headache syndrome, not intractable - Ambulatory referral to Neurology  6. Osteoarthritis of multiple joints, unspecified osteoarthritis type Will add cymbalta if pt can find an affordable pharmacy  Meds ordered this encounter  Medications  . minocycline (MINOCIN,DYNACIN) 50 MG capsule    Sig: Take 1 capsule (50 mg total) by mouth 2 (two) times daily.    Dispense:  60 capsule    Refill:  5    Partially dictated using Editor, commissioning. Any errors are unintentional.  Halina Maidens, MD [facility]  11/21/2017

## 2017-11-24 ENCOUNTER — Other Ambulatory Visit: Payer: Self-pay | Admitting: Internal Medicine

## 2017-11-24 ENCOUNTER — Telehealth: Payer: Self-pay

## 2017-11-24 MED ORDER — DULOXETINE HCL 60 MG PO CPEP: 60.0000 mg | ORAL_CAPSULE | Freq: Every day | ORAL | 1 refills | Status: DC

## 2017-11-24 NOTE — Telephone Encounter (Signed)
Would like 90 day Cymbalta called to [facility] as approved by [employer]. She also said that she would like chest xray ordered for the SOB she mentioned on Friday. Let me know as I plan to call her back.

## 2017-11-24 NOTE — Telephone Encounter (Signed)
Left detailed message regarding not the time for CXR but if worsens or does not get better she can call and come in for follow up and discuss in office

## 2017-11-24 NOTE — Telephone Encounter (Signed)
I will send cymbalta to [facility].   I do not think there is any benefit to a CXR since her exam was normal, she had no fever and normal oxygen levels.

## 2017-12-12 DIAGNOSIS — M3501 Sicca syndrome with keratoconjunctivitis: Secondary | ICD-10-CM | POA: Diagnosis not present

## 2017-12-25 DIAGNOSIS — R51 Headache: Secondary | ICD-10-CM | POA: Diagnosis not present

## 2018-01-29 DIAGNOSIS — R51 Headache: Secondary | ICD-10-CM | POA: Diagnosis not present

## 2018-02-04 ENCOUNTER — Other Ambulatory Visit: Payer: Self-pay | Admitting: Internal Medicine

## 2018-02-04 DIAGNOSIS — Z1231 Encounter for screening mammogram for malignant neoplasm of breast: Secondary | ICD-10-CM

## 2018-02-11 DIAGNOSIS — M25561 Pain in right knee: Secondary | ICD-10-CM | POA: Diagnosis not present

## 2018-02-23 DIAGNOSIS — M7541 Impingement syndrome of right shoulder: Secondary | ICD-10-CM | POA: Diagnosis not present

## 2018-03-02 DIAGNOSIS — M25571 Pain in right ankle and joints of right foot: Secondary | ICD-10-CM | POA: Diagnosis not present

## 2018-03-02 DIAGNOSIS — M25572 Pain in left ankle and joints of left foot: Secondary | ICD-10-CM | POA: Diagnosis not present

## 2018-03-02 DIAGNOSIS — M25519 Pain in unspecified shoulder: Secondary | ICD-10-CM | POA: Diagnosis not present

## 2018-03-02 DIAGNOSIS — M2141 Flat foot [pes planus] (acquired), right foot: Secondary | ICD-10-CM | POA: Diagnosis not present

## 2018-03-02 DIAGNOSIS — M2142 Flat foot [pes planus] (acquired), left foot: Secondary | ICD-10-CM | POA: Diagnosis not present

## 2018-03-02 DIAGNOSIS — M2140 Flat foot [pes planus] (acquired), unspecified foot: Secondary | ICD-10-CM | POA: Diagnosis not present

## 2018-03-02 DIAGNOSIS — M25619 Stiffness of unspecified shoulder, not elsewhere classified: Secondary | ICD-10-CM | POA: Diagnosis not present

## 2018-03-09 ENCOUNTER — Other Ambulatory Visit: Payer: Self-pay | Admitting: Internal Medicine

## 2018-03-09 ENCOUNTER — Ambulatory Visit: Payer: Medicare Other

## 2018-03-09 DIAGNOSIS — G43C0 Periodic headache syndromes in child or adult, not intractable: Secondary | ICD-10-CM

## 2018-03-10 ENCOUNTER — Ambulatory Visit: Payer: Medicare Other

## 2018-03-10 ENCOUNTER — Telehealth: Payer: Self-pay

## 2018-03-10 NOTE — Telephone Encounter (Signed)
Completed patients Prior Auth on covermymeds. Faxed in PA form as well and placed in the pile to be scanned into patient chart for documentation. Awaiting response from insurance. Covermymeds requires 72 hours before giving outcome.

## 2018-03-11 ENCOUNTER — Ambulatory Visit (INDEPENDENT_AMBULATORY_CARE_PROVIDER_SITE_OTHER): Payer: Medicare Other

## 2018-03-11 ENCOUNTER — Ambulatory Visit
Admission: RE | Admit: 2018-03-11 | Discharge: 2018-03-11 | Disposition: A | Discharge status: Home / Self Care | Payer: Medicare Other | Source: Ambulatory Visit | Attending: Internal Medicine | Admitting: Internal Medicine

## 2018-03-11 ENCOUNTER — Encounter (INDEPENDENT_AMBULATORY_CARE_PROVIDER_SITE_OTHER): Payer: Self-pay

## 2018-03-11 DIAGNOSIS — Z23 Encounter for immunization: Secondary | ICD-10-CM

## 2018-03-11 DIAGNOSIS — Z1231 Encounter for screening mammogram for malignant neoplasm of breast: Secondary | ICD-10-CM | POA: Diagnosis not present

## 2018-03-12 NOTE — Telephone Encounter (Signed)
**   PA was for Fioricet with Codeine **

## 2018-03-13 ENCOUNTER — Telehealth: Payer: Self-pay

## 2018-03-13 NOTE — Telephone Encounter (Signed)
Called patient insurance and initiated a new PA over the phone since the one I submitted over covermymeds did not go through. Answered all clinical questions for medication-    butalbital-acetaminophen-caffeine (FIORICET WITH CODEINE) 50-325-40-30 MG capsule  Dx code- G43.009   Was told they will review PA and fax results within 72 hours of today.

## 2018-03-16 ENCOUNTER — Other Ambulatory Visit: Payer: Self-pay | Admitting: Internal Medicine

## 2018-03-16 NOTE — Telephone Encounter (Signed)
Received fax from insurance company today.  Patient medication is approved through 05/27/2019.  Reference#    [id number]

## 2018-03-16 NOTE — Telephone Encounter (Signed)
Called and informed patient of approval. She verbalized understanding.

## 2018-03-17 ENCOUNTER — Telehealth: Payer: Self-pay

## 2018-03-17 NOTE — Telephone Encounter (Signed)
Patient called stating that she called and spoke with her insurance. She went to try and pick up Rx for Fioricet with Codeine and they told her she needed to pay 63.18 for the prescription. They are out of the prescription currently but she can pick it up tomorrow when it will be in stock again. She was told $50 is her copayment for the prescription from insurance. Wanted our office to call and request a tier exception from her insurance so she does not have to pay $50 dollar copayment. Spoke with Dr Army Melia and called the patient to inform her that we do not do any tier exceptions because we carnot control what insurance is willing to pay for her medications. The copayment is her part as they covered the rest. She verbalized understanding of this.

## 2018-03-19 ENCOUNTER — Other Ambulatory Visit: Payer: Self-pay | Admitting: Internal Medicine

## 2018-03-19 ENCOUNTER — Telehealth: Payer: Self-pay

## 2018-03-19 DIAGNOSIS — G43C0 Periodic headache syndromes in child or adult, not intractable: Secondary | ICD-10-CM

## 2018-03-19 MED ORDER — BUTALBITAL-APAP-CAFF-COD 50-325-40-30 MG PO CAPS: 1.0000 | ORAL_CAPSULE | Freq: Four times a day (QID) | ORAL | 0 refills | Status: DC | PRN

## 2018-03-19 NOTE — Telephone Encounter (Signed)
Patient is calling stating she cannot get her Fioricet with codeine from [facility] because it is too expensive.  Said [facility] in [place] will take Good Rx coupon for it and it will save her $30. Wants to know if we can send this to [facility] in [place] instead.   Please Advise.

## 2018-03-19 NOTE — Telephone Encounter (Signed)
Done

## 2018-03-19 NOTE — Telephone Encounter (Signed)
Patient informed. 

## 2018-04-27 DIAGNOSIS — M19072 Primary osteoarthritis, left ankle and foot: Secondary | ICD-10-CM | POA: Diagnosis not present

## 2018-04-27 DIAGNOSIS — M1712 Unilateral primary osteoarthritis, left knee: Secondary | ICD-10-CM | POA: Diagnosis not present

## 2018-04-27 DIAGNOSIS — M19071 Primary osteoarthritis, right ankle and foot: Secondary | ICD-10-CM | POA: Diagnosis not present

## 2018-05-14 ENCOUNTER — Other Ambulatory Visit: Payer: Self-pay | Admitting: Internal Medicine

## 2018-05-14 DIAGNOSIS — G43009 Migraine without aura, not intractable, without status migrainosus: Secondary | ICD-10-CM

## 2018-05-26 ENCOUNTER — Encounter: Payer: Self-pay | Admitting: Internal Medicine

## 2018-05-26 ENCOUNTER — Ambulatory Visit (INDEPENDENT_AMBULATORY_CARE_PROVIDER_SITE_OTHER): Payer: Medicare Other | Admitting: Internal Medicine

## 2018-05-26 VITALS — BP 136/84 | HR 67 | Ht 65.0 in | Wt 203.0 lb

## 2018-05-26 DIAGNOSIS — I10 Essential (primary) hypertension: Secondary | ICD-10-CM

## 2018-05-26 DIAGNOSIS — G43009 Migraine without aura, not intractable, without status migrainosus: Secondary | ICD-10-CM

## 2018-05-26 DIAGNOSIS — K279 Peptic ulcer, site unspecified, unspecified as acute or chronic, without hemorrhage or perforation: Secondary | ICD-10-CM

## 2018-05-26 DIAGNOSIS — J3089 Other allergic rhinitis: Secondary | ICD-10-CM

## 2018-05-26 DIAGNOSIS — E034 Atrophy of thyroid (acquired): Secondary | ICD-10-CM

## 2018-05-26 DIAGNOSIS — M8589 Other specified disorders of bone density and structure, multiple sites: Secondary | ICD-10-CM

## 2018-05-26 DIAGNOSIS — Z0000 Encounter for general adult medical examination without abnormal findings: Secondary | ICD-10-CM

## 2018-05-26 DIAGNOSIS — M3500 Sicca syndrome, unspecified: Secondary | ICD-10-CM

## 2018-05-26 DIAGNOSIS — G43C0 Periodic headache syndromes in child or adult, not intractable: Secondary | ICD-10-CM

## 2018-05-26 DIAGNOSIS — M797 Fibromyalgia: Secondary | ICD-10-CM

## 2018-05-26 DIAGNOSIS — E782 Mixed hyperlipidemia: Secondary | ICD-10-CM | POA: Diagnosis not present

## 2018-05-26 LAB — POCT URINALYSIS DIPSTICK
Bilirubin, UA: NEGATIVE
Blood, UA: NEGATIVE
Glucose, UA: NEGATIVE
Ketones, UA: NEGATIVE
Leukocytes, UA: NEGATIVE
Nitrite, UA: NEGATIVE
Protein, UA: NEGATIVE
Spec Grav, UA: >=1.03 — AB (ref 1.010–1.025)
Urobilinogen, UA: 0.2 E.U./dL
pH, UA: 6.5 (ref 5.0–8.0)

## 2018-05-26 MED ORDER — GABAPENTIN 600 MG PO TABS: 600.0000 mg | ORAL_TABLET | Freq: Three times a day (TID) | ORAL | 3 refills | Status: DC

## 2018-05-26 MED ORDER — TOBRAMYCIN-DEXAMETHASONE 0.3-0.1 % OP SUSP: 1.0000 [drp] | Freq: Four times a day (QID) | OPHTHALMIC | 0 refills | Status: DC

## 2018-05-26 MED ORDER — ALBUTEROL SULFATE HFA 108 (90 BASE) MCG/ACT IN AERS: 2.0000 | INHALATION_SPRAY | Freq: Four times a day (QID) | RESPIRATORY_TRACT | 0 refills | Status: DC | PRN

## 2018-05-26 MED ORDER — TIZANIDINE HCL 4 MG PO TABS: 4.0000 mg | ORAL_TABLET | Freq: Three times a day (TID) | ORAL | 3 refills | Status: DC

## 2018-05-26 MED ORDER — PROPRANOLOL HCL 40 MG PO TABS: 40.0000 mg | ORAL_TABLET | Freq: Two times a day (BID) | ORAL | 3 refills | Status: DC

## 2018-05-26 MED ORDER — OMEPRAZOLE 40 MG PO CPDR: 40.0000 mg | DELAYED_RELEASE_CAPSULE | Freq: Every day | ORAL | 3 refills | Status: DC

## 2018-05-26 NOTE — Progress Notes (Signed)
Date:  05/26/2018   Name:  Ashley Glover   DOB:  [birth year]   MRN:  [id number]   Chief Complaint: Annual Exam (Just had Mammo. ); Depression (Medication questions: Read info on cymbalta and said not to take ibuprofen and tramadol. Patient rarely takes both but wants to be sure. ); and Cough (States needs refill on old inhaler. Said its a grey case ( unsure of name ) and Topridex eye drops 48mL) Ashley Glover is a 57 y.o. female who presents today for her Complete Annual Exam. She feels fairly well - chronic problems are stable. She reports exercising none due to chronic pain. She reports she is sleeping fairly well. Mammogram was normal in November.  Colonoscopy was done in 2018.  Last DEXA was 05/2016, she continues on calcium + D.   Hypertension  This is a chronic problem. The problem is controlled. Associated symptoms include headaches and palpitations. Pertinent negatives include no chest pain or shortness of breath. Past treatments include diuretics and beta blockers. The current treatment provides significant improvement. Identifiable causes of hypertension include a thyroid problem.  Gastroesophageal Reflux  She complains of coughing and heartburn. She reports no abdominal pain, no chest pain or no wheezing. This is a recurrent (hx of peptic ulcer) problem. The problem occurs rarely. Pertinent negatives include no fatigue. She has tried a PPI for the symptoms.  Migraine   This is a chronic problem. Associated symptoms include coughing and eye redness. Pertinent negatives include no abdominal pain, dizziness, fever, hearing loss, sinus pressure, tinnitus or vomiting. Treatments tried: Fioricet. Her past medical history is significant for hypertension.  Thyroid Problem  Presents for follow-up visit. Symptoms include palpitations. Patient reports no anxiety, constipation, diarrhea, fatigue, leg swelling, tremors or weight gain. The symptoms have been stable. Her past medical history is  significant for hyperlipidemia.  Hyperlipidemia  Pertinent negatives include no chest pain or shortness of breath. Current antihyperlipidemic treatment includes statins.  Allergic Rhinitis - on singulair daily as well as claritin.   She has used ventolin in the past. Fibromyalgia - sx are chronic and stable.  She is taking cymbalta and uses ibuprofen or tramadol very rarely.  Lab Results  Component Value Date   CREATININE 0.89 05/21/2017   BUN 12 05/21/2017   NA 141 05/21/2017   K 5.0 05/21/2017   CL 99 05/21/2017   CO2 26 05/21/2017   Lab Results  Component Value Date   CHOL 175 05/21/2017   HDL 75 05/21/2017   LDLCALC 81 05/21/2017   TRIG 93 05/21/2017   CHOLHDL 2.3 05/21/2017   Lab Results  Component Value Date   TSH 4.130 05/21/2017    Review of Systems  Constitutional: Negative for chills, fatigue, fever and weight gain.  HENT: Positive for postnasal drip. Negative for congestion, hearing loss, sinus pressure, tinnitus, trouble swallowing and voice change.   Eyes: Positive for discharge and redness. Negative for visual disturbance.  Respiratory: Positive for cough. Negative for chest tightness, shortness of breath and wheezing.   Cardiovascular: Positive for palpitations. Negative for chest pain and leg swelling.  Gastrointestinal: Positive for heartburn. Negative for abdominal pain, constipation, diarrhea and vomiting.  Endocrine: Negative for polydipsia and polyuria.  Genitourinary: Negative for dysuria, frequency, genital sores, vaginal bleeding and vaginal discharge.  Musculoskeletal: Negative for arthralgias, gait problem and joint swelling.  Skin: Negative for color change and rash.  Neurological: Positive for headaches. Negative for dizziness, tremors and light-headedness.  Hematological: Negative for adenopathy.  Does not bruise/bleed easily.  Psychiatric/Behavioral: Negative for dysphoric mood and sleep disturbance. The patient is not nervous/anxious.      Patient Active Problem List   Diagnosis Date Noted  . Chronic otitis media 09/30/2017  . Redness of both eyes 09/30/2017  . Migraine without aura and without status migrainosus, not intractable 05/21/2017  . Not currently working due to disabled status 01/24/2017  . Degenerative arthritis 01/24/2017  . Frequent PVCs 08/20/2016  . Benign neoplasm of ascending colon   . Benign neoplasm of cecum   . Hx of colonic polyps 04/24/2016  . Periodic headache syndrome, not intractable 01/31/2016  . Rosacea, acne 01/31/2016  . Essential hypertension 08/02/2015  . Hypothyroidism due to acquired atrophy of thyroid 08/02/2015  . Fibromyalgia 08/02/2015  . Sjogren's syndrome ([place]) 08/02/2015  . Peptic ulcer disease 08/02/2015  . Primary osteoarthritis of both knees 08/02/2015  . Osteopenia 08/02/2015  . Other allergic rhinitis 08/02/2015  . Hyperlipidemia, mixed 08/02/2015  . Hx of abnormal cervical Pap smear 08/02/2015    Allergies  Allergen Reactions  . Ace Inhibitors Cough  . Morphine And Related Nausea Only  . Elwyn Reach Hcl]     Past Surgical History:  Procedure Laterality Date  . AUGMENTATION MAMMAPLASTY Bilateral 1987  . COLONOSCOPY  05/2012   polyps  . COLONOSCOPY WITH PROPOFOL N/A 06/20/2016   Procedure: COLONOSCOPY WITH PROPOFOL;  Surgeon: [name], MD;  Location: [place];  Service: Endoscopy;  Laterality: N/A;  . ESOPHAGOGASTRODUODENOSCOPY (EGD) WITH PROPOFOL N/A 06/20/2016   Procedure: ESOPHAGOGASTRODUODENOSCOPY (EGD) WITH PROPOFOL;  Surgeon: [name], MD;  Location: [place];  Service: Endoscopy;  Laterality: N/A;  . FOOT SURGERY Right   . GANGLION CYST EXCISION Left   . NASAL SINUS SURGERY    . NEUROMA SURGERY Left   . PARTIAL HYSTERECTOMY  1996   +HPV and cervical dysplasia  . PLACEMENT OF BREAST IMPLANTS    . POLYPECTOMY  06/20/2016   Procedure: POLYPECTOMY;  Surgeon: [name], MD;  Location: [place];  Service:  Endoscopy;;  . REPLACEMENT TOTAL KNEE Right 06/2014  . TONSILLECTOMY      Social History   Tobacco Use  . Smoking status: Never Smoker  . Smokeless tobacco: Never Used  Substance Use Topics  . Alcohol use: No    Alcohol/week: 0.0 standard drinks  . Drug use: No     Medication list has been reviewed and updated.  Current Meds  Medication Sig  . butalbital-acetaminophen-caffeine (FIORICET WITH CODEINE) 50-325-40-30 MG capsule Take 1 capsule by mouth every 6 (six) hours as needed for headache.  . calcium-vitamin D (OSCAL WITH D) 250-125 MG-UNIT tablet Take 1 tablet by mouth daily.  . DULoxetine (CYMBALTA) 60 MG capsule TAKE 1 CAPSULE BY MOUTH  DAILY  . furosemide (LASIX) 40 MG tablet Take 1 tablet (40 mg total) by mouth daily.  Marland Kitchen gabapentin (NEURONTIN) 600 MG tablet Take 1 tablet (600 mg total) by mouth 3 (three) times daily.  . hydrocortisone 2.5 % cream APPLY TWO TIMES DAILY  . ibuprofen (ADVIL,MOTRIN) 200 MG tablet Take 400 mg by mouth every 6 (six) hours as needed.  Marland Kitchen levothyroxine (SYNTHROID, LEVOTHROID) 88 MCG tablet TAKE 1 TABLET BY MOUTH  DAILY BEFORE BREAKFAST  . loratadine (CLARITIN) 10 MG tablet Take 10 mg by mouth daily.  Marland Kitchen lovastatin (MEVACOR) 40 MG tablet TAKE 1 TABLET BY MOUTH AT  BEDTIME  . montelukast (SINGULAIR) 10 MG tablet TAKE 1 TABLET BY MOUTH AT  BEDTIME  .  omeprazole (PRILOSEC) 40 MG capsule Take 1 capsule (40 mg total) by mouth daily.  . promethazine (PHENERGAN) 25 MG tablet TAKE 1 TABLET BY MOUTH EVERY 8 HOURS AS NEEDED FOR NAUSEA AND VOMITING  . propranolol (INDERAL) 40 MG tablet Take 1 tablet (40 mg total) by mouth 2 (two) times daily.  Marland Kitchen tiZANidine (ZANAFLEX) 4 MG tablet Take 1 tablet (4 mg total) by mouth 3 (three) times daily.  . traMADol (ULTRAM) 50 MG tablet Take 50 mg by mouth every 6 (six) hours as needed.  . [DISCONTINUED] gabapentin (NEURONTIN) 600 MG tablet TAKE 1 TABLET BY MOUTH 3  TIMES DAILY  . [DISCONTINUED] omeprazole (PRILOSEC) 40 MG  capsule TAKE 1 CAPSULE BY MOUTH  DAILY  . [DISCONTINUED] propranolol (INDERAL) 40 MG tablet TAKE 1 TABLET BY MOUTH TWO  TIMES DAILY  . [DISCONTINUED] tiZANidine (ZANAFLEX) 4 MG tablet TAKE 1 TABLET BY MOUTH 3  TIMES DAILY    PHQ 2/9 Scores 05/26/2018 05/01/2017 01/31/2016  PHQ - 2 Score 0 0 0    Physical Exam Vitals signs and nursing note reviewed.  Constitutional:      General: She is not in acute distress.    Appearance: She is well-developed.  HENT:     Head: Normocephalic and atraumatic.     Right Ear: Ear canal normal. No middle ear effusion. Tympanic membrane is scarred.     Left Ear: Ear canal normal.  No middle ear effusion. Tympanic membrane is scarred and bulging.     Nose:     Right Sinus: No maxillary sinus tenderness.     Left Sinus: No maxillary sinus tenderness.     Mouth/Throat:     Mouth: Mucous membranes are moist.     Pharynx: Uvula midline. No posterior oropharyngeal erythema.  Eyes:     General: No scleral icterus.       Right eye: No discharge.        Left eye: No discharge.     Conjunctiva/sclera: Conjunctivae normal.  Neck:     Musculoskeletal: Normal range of motion. No erythema.     Thyroid: No thyromegaly.     Vascular: No carotid bruit.  Cardiovascular:     Rate and Rhythm: Normal rate and regular rhythm.     Pulses: Normal pulses.     Heart sounds: Normal heart sounds.  Pulmonary:     Effort: Pulmonary effort is normal. No respiratory distress.     Breath sounds: No wheezing.  Abdominal:     General: Bowel sounds are normal.     Palpations: Abdomen is soft.     Tenderness: There is no abdominal tenderness.  Musculoskeletal:     Comments: Diffuse tenderness, slow gait noted   Lymphadenopathy:     Cervical: No cervical adenopathy.  Skin:    General: Skin is warm and dry.     Findings: No rash.  Neurological:     Mental Status: She is alert and oriented to person, place, and time.     Cranial Nerves: No cranial nerve deficit.     Sensory:  No sensory deficit.     Deep Tendon Reflexes: Reflexes are normal and symmetric.  Psychiatric:        Attention and Perception: Attention normal.        Mood and Affect: Mood normal.        Speech: Speech normal.        Behavior: Behavior normal.        Thought Content: Thought content normal.  BP 136/84 (BP Location: Right Arm, Patient Position: Sitting, Cuff Size: Large)   Pulse 67   Ht 5\' 5"  (1.651 m)   Wt 203 lb (92.1 kg)   SpO2 98%   BMI 33.78 kg/m   Assessment and Plan: 1. Annual physical exam Normal exam except for weight - unable to exercise due to chronic pain Mammogram done last month  - Hemoglobin A1c - POCT urinalysis dipstick  2. Essential hypertension Fair control - continue propranolol and lasix - Comprehensive metabolic panel - propranolol (INDERAL) 40 MG tablet; Take 1 tablet (40 mg total) by mouth 2 (two) times daily.  Dispense: 180 tablet; Refill: 3  3. Migraine without aura and without status migrainosus, not intractable Unchanged per patient - continue Fioricet PRN  4. Periodic headache syndrome, not intractable As above  5. Peptic ulcer disease On PPI Avoid frequent doses of nsaids - CBC with Differential/Platelet - omeprazole (PRILOSEC) 40 MG capsule; Take 1 capsule (40 mg total) by mouth daily.  Dispense: 90 capsule; Refill: 3  6. Sjogren's syndrome without extraglandular involvement ([place]) Stable, should see Opthalmology  7. Hypothyroidism due to acquired atrophy of thyroid supplemented - TSH  8. Hyperlipidemia, mixed On statin therapy - Lipid panel  9. Fibromyalgia Stable, chronic sx - gabapentin (NEURONTIN) 600 MG tablet; Take 1 tablet (600 mg total) by mouth 3 (three) times daily.  Dispense: 270 tablet; Refill: 3 - tiZANidine (ZANAFLEX) 4 MG tablet; Take 1 tablet (4 mg total) by mouth 3 (three) times daily.  Dispense: 270 tablet; Refill: 3  10. Other allergic rhinitis For eye sx Also Proair or Ventolin PRN -  tobramycin-dexamethasone (TOBRADEX) ophthalmic solution; Place 1 drop into both eyes every 6 (six) hours.  Dispense: 5 mL; Refill: 0  11. Osteopenia of multiple sites Repeat study due Continue calcium and vitamin D - DG Bone Density; Future   Partially dictated using Editor, commissioning. Any errors are unintentional.  Halina Maidens, MD [facility]  05/26/2018

## 2018-05-26 NOTE — Patient Instructions (Addendum)
Health Maintenance  Topic Date Due  . HIV Screening  04/20/1976  . TETANUS/TDAP  11/09/2018  . MAMMOGRAM  03/12/2019  . COLONOSCOPY  06/21/2019  . INFLUENZA VACCINE  Completed  . Hepatitis C Screening  Completed    Breast Self-Awareness Breast self-awareness means being familiar with how your breasts look and feel. It involves checking your breasts regularly and reporting any changes to your health care provider. Practicing breast self-awareness is important. A change in your breasts can be a sign of a serious medical problem. Being familiar with how your breasts look and feel allows you to find any problems early, when treatment is more likely to be successful. All women should practice breast self-awareness, including women who have had breast implants. How to do a breast self-exam One way to learn what is normal for your breasts and whether your breasts are changing is to do a breast self-exam. To do a breast self-exam: Look for Changes  1. Remove all the clothing above your waist. 2. Stand in front of a mirror in a room with good lighting. 3. Put your hands on your hips. 4. Push your hands firmly downward. 5. Compare your breasts in the mirror. Look for differences between them (asymmetry), such as: ? Differences in shape. ? Differences in size. ? Puckers, dips, and bumps in one breast and not the other. 6. Look at each breast for changes in your skin, such as: ? Redness. ? Scaly areas. 7. Look for changes in your nipples, such as: ? Discharge. ? Bleeding. ? Dimpling. ? Redness. ? A change in position. Feel for Changes Carefully feel your breasts for lumps and changes. It is best to do this while lying on your back on the floor and again while sitting or standing in the shower or tub with soapy water on your skin. Feel each breast in the following way:  Place the arm on the side of the breast you are examining above your head.  Feel your breast with the other hand.  Start  in the nipple area and make  inch (2 cm) overlapping circles to feel your breast. Use the pads of your three middle fingers to do this. Apply light pressure, then medium pressure, then firm pressure. The light pressure will allow you to feel the tissue closest to the skin. The medium pressure will allow you to feel the tissue that is a little deeper. The firm pressure will allow you to feel the tissue close to the ribs.  Continue the overlapping circles, moving downward over the breast until you feel your ribs below your breast.  Move one finger-width toward the center of the body. Continue to use the  inch (2 cm) overlapping circles to feel your breast as you move slowly up toward your collarbone.  Continue the up and down exam using all three pressures until you reach your armpit.  Write Down What You Find  Write down what is normal for each breast and any changes that you find. Keep a written record with breast changes or normal findings for each breast. By writing this information down, you do not need to depend only on memory for size, tenderness, or location. Write down where you are in your menstrual cycle, if you are still menstruating. If you are having trouble noticing differences in your breasts, do not get discouraged. With time you will become more familiar with the variations in your breasts and more comfortable with the exam. How often should I examine my  breasts? Examine your breasts every month. If you are breastfeeding, the best time to examine your breasts is after a feeding or after using a breast pump. If you menstruate, the best time to examine your breasts is 5-7 days after your period is over. During your period, your breasts are lumpier, and it may be more difficult to notice changes. When should I see my health care provider? See your health care provider if you notice:  A change in shape or size of your breasts or nipples.  A change in the skin of your breast or  nipples, such as a reddened or scaly area.  Unusual discharge from your nipples.  A lump or thick area that was not there before.  Pain in your breasts.  Anything that concerns you. This information is not intended to replace advice given to you by your health care provider. Make sure you discuss any questions you have with your health care provider. Document Released: 05/13/2005 Document Revised: 10/19/2015 Document Reviewed: 04/02/2015 Elsevier Interactive Patient Education  2019 [employer].

## 2018-05-27 LAB — COMPREHENSIVE METABOLIC PANEL
ALK PHOS: 133 IU/L — AB (ref 39–117)
ALT: 39 IU/L — AB (ref 0–32)
AST: 32 IU/L (ref 0–40)
Albumin/Globulin Ratio: 1.6 (ref 1.2–2.2)
Albumin: 4.7 g/dL (ref 3.5–5.5)
BUN/Creatinine Ratio: 10 (ref 9–23)
BUN: 10 mg/dL (ref 6–24)
Bilirubin Total: 0.2 mg/dL (ref 0.0–1.2)
CHLORIDE: 98 mmol/L (ref 96–106)
CO2: 23 mmol/L (ref 20–29)
Calcium: 9.9 mg/dL (ref 8.7–10.2)
Creatinine, Ser: 0.97 mg/dL (ref 0.57–1.00)
GFR calc Af Amer: 74 mL/min/{1.73_m2} (ref >59)
GFR calc non Af Amer: 64 mL/min/{1.73_m2} (ref >59)
Globulin, Total: 2.9 g/dL (ref 1.5–4.5)
Glucose: 101 mg/dL — ABNORMAL HIGH (ref 65–99)
Potassium: 5.1 mmol/L (ref 3.5–5.2)
Sodium: 140 mmol/L (ref 134–144)
Total Protein: 7.6 g/dL (ref 6.0–8.5)

## 2018-05-27 LAB — CBC WITH DIFFERENTIAL/PLATELET
Basophils Absolute: 0 10*3/uL (ref 0.0–0.2)
Basos: 0 %
EOS (ABSOLUTE): 0.1 10*3/uL (ref 0.0–0.4)
Eos: 1 %
Hematocrit: 44.5 % (ref 34.0–46.6)
Hemoglobin: 14.9 g/dL (ref 11.1–15.9)
Immature Grans (Abs): 0 10*3/uL (ref 0.0–0.1)
Immature Granulocytes: 0 %
LYMPHS ABS: 2.1 10*3/uL (ref 0.7–3.1)
Lymphs: 23 %
MCH: 29.2 pg (ref 26.6–33.0)
MCHC: 33.5 g/dL (ref 31.5–35.7)
MCV: 87 fL (ref 79–97)
MONOS ABS: 0.7 10*3/uL (ref 0.1–0.9)
Monocytes: 7 %
Neutrophils Absolute: 6.2 10*3/uL (ref 1.4–7.0)
Neutrophils: 69 %
PLATELETS: 349 10*3/uL (ref 150–450)
RBC: 5.1 x10E6/uL (ref 3.77–5.28)
RDW: 13.3 % (ref 12.3–15.4)
WBC: 9.1 10*3/uL (ref 3.4–10.8)

## 2018-05-27 LAB — HEMOGLOBIN A1C
Est. average glucose Bld gHb Est-mCnc: 117 mg/dL
Hgb A1c MFr Bld: 5.7 % — ABNORMAL HIGH (ref 4.8–5.6)

## 2018-05-27 LAB — LIPID PANEL
CHOL/HDL RATIO: 2.8 ratio (ref 0.0–4.4)
Cholesterol, Total: 232 mg/dL — ABNORMAL HIGH (ref 100–199)
HDL: 83 mg/dL (ref >39)
LDL Calculated: 126 mg/dL — ABNORMAL HIGH (ref 0–99)
Triglycerides: 117 mg/dL (ref 0–149)
VLDL Cholesterol Cal: 23 mg/dL (ref 5–40)

## 2018-05-27 LAB — TSH: TSH: 4.6 u[IU]/mL — ABNORMAL HIGH (ref 0.450–4.500)

## 2018-06-01 ENCOUNTER — Other Ambulatory Visit: Payer: Self-pay | Admitting: Internal Medicine

## 2018-06-01 DIAGNOSIS — J3089 Other allergic rhinitis: Secondary | ICD-10-CM

## 2018-06-01 DIAGNOSIS — E782 Mixed hyperlipidemia: Secondary | ICD-10-CM

## 2018-06-01 DIAGNOSIS — E034 Atrophy of thyroid (acquired): Secondary | ICD-10-CM

## 2018-06-08 ENCOUNTER — Other Ambulatory Visit: Payer: Self-pay

## 2018-06-23 ENCOUNTER — Encounter: Payer: Self-pay | Admitting: Internal Medicine

## 2018-06-23 ENCOUNTER — Ambulatory Visit
Admission: RE | Admit: 2018-06-23 | Discharge: 2018-06-23 | Disposition: A | Discharge status: Home / Self Care | Payer: Medicare Other | Source: Ambulatory Visit | Attending: Internal Medicine | Admitting: Internal Medicine

## 2018-06-23 DIAGNOSIS — H6060 Unspecified chronic otitis externa, unspecified ear: Secondary | ICD-10-CM | POA: Diagnosis not present

## 2018-06-23 DIAGNOSIS — R51 Headache: Secondary | ICD-10-CM | POA: Diagnosis not present

## 2018-06-23 DIAGNOSIS — M8589 Other specified disorders of bone density and structure, multiple sites: Secondary | ICD-10-CM | POA: Diagnosis not present

## 2018-06-23 DIAGNOSIS — J300 Vasomotor rhinitis: Secondary | ICD-10-CM | POA: Diagnosis not present

## 2018-06-23 DIAGNOSIS — M85851 Other specified disorders of bone density and structure, right thigh: Secondary | ICD-10-CM | POA: Diagnosis not present

## 2018-06-23 DIAGNOSIS — J301 Allergic rhinitis due to pollen: Secondary | ICD-10-CM | POA: Diagnosis not present

## 2018-06-23 DIAGNOSIS — M8580 Other specified disorders of bone density and structure, unspecified site: Secondary | ICD-10-CM | POA: Insufficient documentation

## 2018-09-15 ENCOUNTER — Other Ambulatory Visit: Payer: Self-pay | Admitting: Internal Medicine

## 2018-10-05 DIAGNOSIS — M19071 Primary osteoarthritis, right ankle and foot: Secondary | ICD-10-CM | POA: Diagnosis not present

## 2018-10-05 DIAGNOSIS — M659 Synovitis and tenosynovitis, unspecified: Secondary | ICD-10-CM | POA: Diagnosis not present

## 2018-10-05 DIAGNOSIS — M1712 Unilateral primary osteoarthritis, left knee: Secondary | ICD-10-CM | POA: Diagnosis not present

## 2018-10-05 DIAGNOSIS — M19072 Primary osteoarthritis, left ankle and foot: Secondary | ICD-10-CM | POA: Diagnosis not present

## 2018-11-22 ENCOUNTER — Other Ambulatory Visit: Payer: Self-pay | Admitting: Internal Medicine

## 2018-11-24 ENCOUNTER — Ambulatory Visit: Payer: Medicare Other | Admitting: Internal Medicine

## 2018-12-04 DIAGNOSIS — M8589 Other specified disorders of bone density and structure, multiple sites: Secondary | ICD-10-CM | POA: Diagnosis not present

## 2018-12-08 ENCOUNTER — Ambulatory Visit: Payer: Medicare Other | Admitting: Internal Medicine

## 2018-12-17 ENCOUNTER — Other Ambulatory Visit: Payer: Self-pay | Admitting: Internal Medicine

## 2018-12-17 DIAGNOSIS — I10 Essential (primary) hypertension: Secondary | ICD-10-CM | POA: Diagnosis not present

## 2018-12-17 DIAGNOSIS — I493 Ventricular premature depolarization: Secondary | ICD-10-CM | POA: Diagnosis not present

## 2018-12-17 DIAGNOSIS — E782 Mixed hyperlipidemia: Secondary | ICD-10-CM | POA: Diagnosis not present

## 2018-12-22 ENCOUNTER — Ambulatory Visit (INDEPENDENT_AMBULATORY_CARE_PROVIDER_SITE_OTHER): Payer: Medicare Other | Admitting: Internal Medicine

## 2018-12-22 ENCOUNTER — Encounter: Payer: Self-pay | Admitting: Internal Medicine

## 2018-12-22 ENCOUNTER — Other Ambulatory Visit: Payer: Self-pay

## 2018-12-22 VITALS — BP 116/78 | HR 71 | Temp 97.6°F | Ht 65.0 in | Wt 204.0 lb

## 2018-12-22 DIAGNOSIS — I493 Ventricular premature depolarization: Secondary | ICD-10-CM

## 2018-12-22 DIAGNOSIS — G43009 Migraine without aura, not intractable, without status migrainosus: Secondary | ICD-10-CM | POA: Diagnosis not present

## 2018-12-22 DIAGNOSIS — E034 Atrophy of thyroid (acquired): Secondary | ICD-10-CM | POA: Diagnosis not present

## 2018-12-22 DIAGNOSIS — I10 Essential (primary) hypertension: Secondary | ICD-10-CM

## 2018-12-22 DIAGNOSIS — R7989 Other specified abnormal findings of blood chemistry: Secondary | ICD-10-CM

## 2018-12-22 DIAGNOSIS — M8580 Other specified disorders of bone density and structure, unspecified site: Secondary | ICD-10-CM

## 2018-12-22 DIAGNOSIS — R945 Abnormal results of liver function studies: Secondary | ICD-10-CM | POA: Diagnosis not present

## 2018-12-22 DIAGNOSIS — Z1231 Encounter for screening mammogram for malignant neoplasm of breast: Secondary | ICD-10-CM

## 2018-12-22 DIAGNOSIS — G43C0 Periodic headache syndromes in child or adult, not intractable: Secondary | ICD-10-CM

## 2018-12-22 DIAGNOSIS — J3089 Other allergic rhinitis: Secondary | ICD-10-CM

## 2018-12-22 MED ORDER — ALBUTEROL SULFATE HFA 108 (90 BASE) MCG/ACT IN AERS: 2.0000 | INHALATION_SPRAY | Freq: Four times a day (QID) | RESPIRATORY_TRACT | 5 refills | Status: DC | PRN

## 2018-12-22 MED ORDER — PROMETHAZINE HCL 25 MG PO TABS: 25.0000 mg | ORAL_TABLET | Freq: Three times a day (TID) | ORAL | 1 refills | Status: DC | PRN

## 2018-12-22 MED ORDER — TOBRAMYCIN-DEXAMETHASONE 0.3-0.1 % OP SUSP: 1.0000 [drp] | Freq: Four times a day (QID) | OPHTHALMIC | 5 refills | Status: DC

## 2018-12-22 MED ORDER — BUTALBITAL-APAP-CAFF-COD 50-325-40-30 MG PO CAPS: 1.0000 | ORAL_CAPSULE | Freq: Four times a day (QID) | ORAL | 0 refills | Status: DC | PRN

## 2018-12-22 NOTE — Progress Notes (Signed)
Date:  12/22/2018   Name:  Ashley Glover   DOB:  1960/08/17   MRN:  [id number]   Chief Complaint: Hypertension (6 month follow up.) and Hypothyroidism  Hypertension This is a chronic problem. The problem is unchanged. The problem is controlled. Associated symptoms include headaches and palpitations. Pertinent negatives include no chest pain or shortness of breath (at times not related to palpitations or stress). Past treatments include beta blockers and diuretics. The current treatment provides significant improvement. There are no compliance problems.  Identifiable causes of hypertension include a thyroid problem.  Thyroid Problem Presents for follow-up visit. Symptoms include fatigue and palpitations. Patient reports no anxiety, leg swelling or weight loss. The symptoms have been stable.  Palpitations  This is a recurrent problem. The problem occurs intermittently. The problem has been unchanged. Pertinent negatives include no anxiety, chest pain, coughing, dizziness, fever, numbness or shortness of breath (at times not related to palpitations or stress). She has tried beta blockers for the symptoms. The treatment provided moderate relief. Risk factors: recently seen by Cardiology with plans for ECHO.  Migraine  This is a recurrent problem. The problem occurs intermittently. The problem has been unchanged. Pertinent negatives include no coughing, dizziness, fever, numbness, sinus pressure, sore throat or weight loss. Treatments tried: Phenergan and fioricet with codeine prn. Her past medical history is significant for hypertension.  Elevated liver tests - last visit pt has mild increase in AP and ALT.  She denies abdominal pain, use of supplements not listed, change in urine color, jaundice, unintended wt loss. Osteopenia - DEXA last January showed this at the right femur but total hip and forearm were normal.  She was seen by rheum but not felt to be a good candidate for Fosamax due to family  hx of clotting and her intestinal issues.  She wants to see if she can have another DEXA next January.  Lab Results  Component Value Date   ALT 39 (H) 05/26/2018   AST 32 05/26/2018   ALKPHOS 133 (H) 05/26/2018   BILITOT 0.2 05/26/2018    Lab Results  Component Value Date   TSH 4.600 (H) 05/26/2018    Lab Results  Component Value Date   CREATININE 0.97 05/26/2018   BUN 10 05/26/2018   NA 140 05/26/2018   K 5.1 05/26/2018   CL 98 05/26/2018   CO2 23 05/26/2018     Review of Systems  Constitutional: Positive for fatigue. Negative for chills, fever, unexpected weight change and weight loss.  HENT: Positive for postnasal drip. Negative for sinus pressure, sore throat, trouble swallowing and voice change.   Eyes: Negative for visual disturbance.  Respiratory: Negative for cough, chest tightness, shortness of breath (at times not related to palpitations or stress) and wheezing.   Cardiovascular: Positive for palpitations. Negative for chest pain.  Musculoskeletal: Positive for arthralgias and myalgias.  Skin: Negative for color change and rash.  Allergic/Immunologic: Positive for environmental allergies.  Neurological: Positive for headaches. Negative for dizziness, syncope, light-headedness and numbness.  Psychiatric/Behavioral: Negative for dysphoric mood. The patient is not nervous/anxious.     Patient Active Problem List   Diagnosis Date Noted  . Osteopenia determined by x-ray 06/23/2018  . Chronic otitis media 09/30/2017  . Redness of both eyes 09/30/2017  . Migraine without aura and without status migrainosus, not intractable 05/21/2017  . Not currently working due to disabled status 01/24/2017  . Degenerative arthritis 01/24/2017  . Frequent PVCs 08/20/2016  . Benign neoplasm of  ascending colon   . Benign neoplasm of cecum   . Hx of colonic polyps 04/24/2016  . Periodic headache syndrome, not intractable 01/31/2016  . Rosacea, acne 01/31/2016  . Essential  hypertension 08/02/2015  . Hypothyroidism due to acquired atrophy of thyroid 08/02/2015  . Fibromyalgia 08/02/2015  . Sjogren's syndrome ([place]) 08/02/2015  . Peptic ulcer disease 08/02/2015  . Primary osteoarthritis of both knees 08/02/2015  . Osteopenia 08/02/2015  . Other allergic rhinitis 08/02/2015  . Hyperlipidemia, mixed 08/02/2015  . Hx of abnormal cervical Pap smear 08/02/2015    Allergies  Allergen Reactions  . Ace Inhibitors Cough  . Morphine And Related Nausea Only  . Elwyn Reach Hcl]     Past Surgical History:  Procedure Laterality Date  . AUGMENTATION MAMMAPLASTY Bilateral 1987  . COLONOSCOPY  05/2012   polyps  . COLONOSCOPY WITH PROPOFOL N/A 06/20/2016   Procedure: COLONOSCOPY WITH PROPOFOL;  Surgeon: [name], MD;  Location: [facility];  Service: Endoscopy;  Laterality: N/A;  . ESOPHAGOGASTRODUODENOSCOPY (EGD) WITH PROPOFOL N/A 06/20/2016   Procedure: ESOPHAGOGASTRODUODENOSCOPY (EGD) WITH PROPOFOL;  Surgeon: [name], MD;  Location: [facility];  Service: Endoscopy;  Laterality: N/A;  . FOOT SURGERY Right   . GANGLION CYST EXCISION Left   . NASAL SINUS SURGERY    . NEUROMA SURGERY Left   . PARTIAL HYSTERECTOMY  1996   +HPV and cervical dysplasia  . PLACEMENT OF BREAST IMPLANTS    . POLYPECTOMY  06/20/2016   Procedure: POLYPECTOMY;  Surgeon: [name], MD;  Location: [facility];  Service: Endoscopy;;  . REPLACEMENT TOTAL KNEE Right 06/2014  . TONSILLECTOMY      Social History   Tobacco Use  . Smoking status: Never Smoker  . Smokeless tobacco: Never Used  Substance Use Topics  . Alcohol use: No    Alcohol/week: 0.0 standard drinks  . Drug use: No     Medication list has been reviewed and updated.  Current Meds  Medication Sig  . albuterol (PROVENTIL HFA;VENTOLIN HFA) 108 (90 Base) MCG/ACT inhaler Inhale 2 puffs into the lungs every 6 (six) hours as needed for wheezing or shortness of breath.  .  butalbital-acetaminophen-caffeine (FIORICET WITH CODEINE) 50-325-40-30 MG capsule Take 1 capsule by mouth every 6 (six) hours as needed for headache.  . calcium-vitamin D (OSCAL WITH D) 250-125 MG-UNIT tablet Take 1 tablet by mouth daily.  . DULoxetine (CYMBALTA) 60 MG capsule TAKE 1 CAPSULE BY MOUTH  DAILY  . furosemide (LASIX) 40 MG tablet Take 1 tablet (40 mg total) by mouth daily.  Marland Kitchen gabapentin (NEURONTIN) 600 MG tablet Take 1 tablet (600 mg total) by mouth 3 (three) times daily.  . hydrocortisone 2.5 % cream APPLY TOPICALLY TWO TIMES   DAILY  . levothyroxine (SYNTHROID, LEVOTHROID) 88 MCG tablet TAKE 1 TABLET BY MOUTH  DAILY BEFORE BREAKFAST  . loratadine (CLARITIN) 10 MG tablet Take 10 mg by mouth daily.  Marland Kitchen lovastatin (MEVACOR) 40 MG tablet TAKE 1 TABLET BY MOUTH AT  BEDTIME  . montelukast (SINGULAIR) 10 MG tablet TAKE 1 TABLET BY MOUTH AT  BEDTIME  . omeprazole (PRILOSEC) 40 MG capsule Take 1 capsule (40 mg total) by mouth daily.  . promethazine (PHENERGAN) 25 MG tablet TAKE 1 TABLET BY MOUTH EVERY 8 HOURS AS NEEDED FOR NAUSEA AND VOMITING  . propranolol (INDERAL) 40 MG tablet Take 1 tablet (40 mg total) by mouth 2 (two) times daily.  Marland Kitchen tiZANidine (ZANAFLEX) 4 MG tablet Take 1 tablet (4 mg  total) by mouth 3 (three) times daily.  Marland Kitchen tobramycin-dexamethasone (TOBRADEX) ophthalmic solution Place 1 drop into both eyes every 6 (six) hours.    PHQ 2/9 Scores 12/22/2018 05/26/2018 05/01/2017 01/31/2016  PHQ - 2 Score 0 0 0 0  PHQ- 9 Score 0 - - -    BP Readings from Last 3 Encounters:  12/22/18 116/78  05/26/18 136/84  11/21/17 110/76    Physical Exam Vitals signs and nursing note reviewed.  Constitutional:      General: She is not in acute distress.    Appearance: Normal appearance. She is well-developed.  HENT:     Head: Normocephalic and atraumatic.  Cardiovascular:     Rate and Rhythm: Normal rate and regular rhythm.     Pulses: Normal pulses.     Heart sounds: No murmur.   Pulmonary:     Effort: Pulmonary effort is normal. No respiratory distress.     Breath sounds: Normal breath sounds. No wheezing or rhonchi.  Musculoskeletal:     Right knee: She exhibits decreased range of motion. She exhibits no effusion.     Left knee: She exhibits decreased range of motion. She exhibits no effusion.     Lumbar back: She exhibits decreased range of motion.     Right lower leg: No edema.     Left lower leg: No edema.  Skin:    General: Skin is warm and dry.     Capillary Refill: Capillary refill takes less than 2 seconds.     Findings: No rash.  Neurological:     General: No focal deficit present.     Mental Status: She is alert and oriented to person, place, and time.  Psychiatric:        Attention and Perception: Attention normal.        Mood and Affect: Mood normal.        Behavior: Behavior normal.        Thought Content: Thought content normal.     Wt Readings from Last 3 Encounters:  12/22/18 204 lb (92.5 kg)  05/26/18 203 lb (92.1 kg)  11/21/17 187 lb (84.8 kg)    BP 116/78   Pulse 71   Temp 97.6 F (36.4 C) (Temporal)   Ht 5\' 5"  (1.651 m)   Wt 204 lb (92.5 kg)   SpO2 96%   BMI 33.95 kg/m   Assessment and Plan: 1. Essential hypertension Controlled, continue current medications  2. Frequent PVCs controlled  3. Hypothyroidism due to acquired atrophy of thyroid supplemented - Thyroid Panel With TSH  4. Other allergic rhinitis - albuterol (VENTOLIN HFA) 108 (90 Base) MCG/ACT inhaler; Inhale 2 puffs into the lungs every 6 (six) hours as needed for wheezing or shortness of breath.  Dispense: 8.5 g; Refill: 5 - tobramycin-dexamethasone (TOBRADEX) ophthalmic solution; Place 1 drop into both eyes every 6 (six) hours.  Dispense: 5 mL; Refill: 5  5. Migraine without aura and without status migrainosus, not intractable Intermittent headache with good response to medications - promethazine (PHENERGAN) 25 MG tablet; Take 1 tablet (25 mg total) by  mouth every 8 (eight) hours as needed for nausea or vomiting.  Dispense: 60 tablet; Refill: 1  6. Elevated liver function tests Will repeat; consider [place] if still elevated - Comprehensive metabolic panel  7. Encounter for screening mammogram for breast cancer - MM 3D SCREEN BREAST BILATERAL; Future  8. Periodic headache syndrome, not intractable - butalbital-acetaminophen-caffeine (FIORICET WITH CODEINE) 50-325-40-30 MG capsule; Take 1 capsule by mouth  every 6 (six) hours as needed for headache.  Dispense: 60 capsule; Refill: 0  9. Osteopenia, unspecified location Consider DEXA next visit   Partially dictated using Dragon software. Any errors are unintentional.  Halina Maidens, MD [facility]  12/22/2018

## 2018-12-23 LAB — COMPREHENSIVE METABOLIC PANEL
ALT: 21 IU/L (ref 0–32)
AST: 22 IU/L (ref 0–40)
Albumin/Globulin Ratio: 1.7 (ref 1.2–2.2)
Albumin: 4.3 g/dL (ref 3.8–4.9)
Alkaline Phosphatase: 98 IU/L (ref 39–117)
BUN/Creatinine Ratio: 13 (ref 9–23)
BUN: 11 mg/dL (ref 6–24)
Bilirubin Total: 0.3 mg/dL (ref 0.0–1.2)
CO2: 26 mmol/L (ref 20–29)
Calcium: 9.7 mg/dL (ref 8.7–10.2)
Chloride: 98 mmol/L (ref 96–106)
Creatinine, Ser: 0.88 mg/dL (ref 0.57–1.00)
GFR calc Af Amer: 83 mL/min/{1.73_m2} (ref >59)
GFR calc non Af Amer: 72 mL/min/{1.73_m2} (ref >59)
Globulin, Total: 2.5 g/dL (ref 1.5–4.5)
Glucose: 86 mg/dL (ref 65–99)
Potassium: 4.9 mmol/L (ref 3.5–5.2)
Sodium: 140 mmol/L (ref 134–144)
Total Protein: 6.8 g/dL (ref 6.0–8.5)

## 2018-12-23 LAB — THYROID PANEL WITH TSH
Free Thyroxine Index: 2.6 (ref 1.2–4.9)
T3 Uptake Ratio: 24 % (ref 24–39)
T4, Total: 10.7 ug/dL (ref 4.5–12.0)
TSH: 2.1 u[IU]/mL (ref 0.450–4.500)

## 2019-01-12 DIAGNOSIS — I83812 Varicose veins of left lower extremities with pain: Secondary | ICD-10-CM | POA: Diagnosis not present

## 2019-01-15 DIAGNOSIS — I10 Essential (primary) hypertension: Secondary | ICD-10-CM | POA: Diagnosis not present

## 2019-01-15 DIAGNOSIS — I493 Ventricular premature depolarization: Secondary | ICD-10-CM | POA: Diagnosis not present

## 2019-01-20 DIAGNOSIS — S8391XA Sprain of unspecified site of right knee, initial encounter: Secondary | ICD-10-CM | POA: Diagnosis not present

## 2019-01-20 DIAGNOSIS — M25561 Pain in right knee: Secondary | ICD-10-CM | POA: Diagnosis not present

## 2019-01-22 DIAGNOSIS — Z87828 Personal history of other (healed) physical injury and trauma: Secondary | ICD-10-CM | POA: Diagnosis not present

## 2019-01-22 DIAGNOSIS — I83812 Varicose veins of left lower extremities with pain: Secondary | ICD-10-CM | POA: Diagnosis not present

## 2019-01-26 DIAGNOSIS — H6060 Unspecified chronic otitis externa, unspecified ear: Secondary | ICD-10-CM | POA: Diagnosis not present

## 2019-01-26 DIAGNOSIS — J301 Allergic rhinitis due to pollen: Secondary | ICD-10-CM | POA: Diagnosis not present

## 2019-01-28 DIAGNOSIS — D485 Neoplasm of uncertain behavior of skin: Secondary | ICD-10-CM | POA: Diagnosis not present

## 2019-01-28 DIAGNOSIS — L814 Other melanin hyperpigmentation: Secondary | ICD-10-CM | POA: Diagnosis not present

## 2019-01-28 DIAGNOSIS — D229 Melanocytic nevi, unspecified: Secondary | ICD-10-CM | POA: Diagnosis not present

## 2019-01-28 DIAGNOSIS — D2230 Melanocytic nevi of unspecified part of face: Secondary | ICD-10-CM | POA: Diagnosis not present

## 2019-01-28 DIAGNOSIS — D225 Melanocytic nevi of trunk: Secondary | ICD-10-CM | POA: Diagnosis not present

## 2019-01-28 DIAGNOSIS — L988 Other specified disorders of the skin and subcutaneous tissue: Secondary | ICD-10-CM | POA: Diagnosis not present

## 2019-02-04 ENCOUNTER — Other Ambulatory Visit: Payer: Self-pay | Admitting: Internal Medicine

## 2019-02-09 DIAGNOSIS — M79605 Pain in left leg: Secondary | ICD-10-CM | POA: Diagnosis not present

## 2019-02-15 DIAGNOSIS — M67371 Transient synovitis, right ankle and foot: Secondary | ICD-10-CM | POA: Diagnosis not present

## 2019-02-15 DIAGNOSIS — M1712 Unilateral primary osteoarthritis, left knee: Secondary | ICD-10-CM | POA: Diagnosis not present

## 2019-02-15 DIAGNOSIS — M67372 Transient synovitis, left ankle and foot: Secondary | ICD-10-CM | POA: Diagnosis not present

## 2019-02-15 DIAGNOSIS — M7051 Other bursitis of knee, right knee: Secondary | ICD-10-CM | POA: Diagnosis not present

## 2019-02-20 DIAGNOSIS — D485 Neoplasm of uncertain behavior of skin: Secondary | ICD-10-CM | POA: Diagnosis not present

## 2019-02-20 DIAGNOSIS — L814 Other melanin hyperpigmentation: Secondary | ICD-10-CM | POA: Diagnosis not present

## 2019-02-20 DIAGNOSIS — D229 Melanocytic nevi, unspecified: Secondary | ICD-10-CM | POA: Diagnosis not present

## 2019-02-20 DIAGNOSIS — D225 Melanocytic nevi of trunk: Secondary | ICD-10-CM | POA: Diagnosis not present

## 2019-02-20 DIAGNOSIS — D2230 Melanocytic nevi of unspecified part of face: Secondary | ICD-10-CM | POA: Diagnosis not present

## 2019-03-04 ENCOUNTER — Other Ambulatory Visit: Payer: Self-pay | Admitting: Internal Medicine

## 2019-03-05 ENCOUNTER — Other Ambulatory Visit: Payer: Self-pay

## 2019-03-05 DIAGNOSIS — I10 Essential (primary) hypertension: Secondary | ICD-10-CM

## 2019-03-05 MED ORDER — PROPRANOLOL HCL 40 MG PO TABS: 40.0000 mg | ORAL_TABLET | Freq: Two times a day (BID) | ORAL | 1 refills | Status: DC

## 2019-03-15 ENCOUNTER — Ambulatory Visit
Admission: RE | Admit: 2019-03-15 | Discharge: 2019-03-15 | Disposition: A | Discharge status: Home / Self Care | Payer: Medicare Other | Source: Ambulatory Visit | Attending: Internal Medicine | Admitting: Internal Medicine

## 2019-03-15 ENCOUNTER — Other Ambulatory Visit: Payer: Self-pay

## 2019-03-15 ENCOUNTER — Encounter (INDEPENDENT_AMBULATORY_CARE_PROVIDER_SITE_OTHER): Payer: Self-pay

## 2019-03-15 DIAGNOSIS — Z1231 Encounter for screening mammogram for malignant neoplasm of breast: Secondary | ICD-10-CM

## 2019-04-22 ENCOUNTER — Other Ambulatory Visit: Payer: Self-pay | Admitting: Internal Medicine

## 2019-04-22 DIAGNOSIS — E034 Atrophy of thyroid (acquired): Secondary | ICD-10-CM

## 2019-04-22 DIAGNOSIS — J3089 Other allergic rhinitis: Secondary | ICD-10-CM

## 2019-04-22 DIAGNOSIS — E782 Mixed hyperlipidemia: Secondary | ICD-10-CM

## 2019-04-22 DIAGNOSIS — K279 Peptic ulcer, site unspecified, unspecified as acute or chronic, without hemorrhage or perforation: Secondary | ICD-10-CM

## 2019-04-22 DIAGNOSIS — M797 Fibromyalgia: Secondary | ICD-10-CM

## 2019-05-04 ENCOUNTER — Other Ambulatory Visit: Payer: Self-pay | Admitting: Internal Medicine

## 2019-05-04 DIAGNOSIS — M797 Fibromyalgia: Secondary | ICD-10-CM

## 2019-05-31 ENCOUNTER — Encounter: Payer: Medicare Other | Admitting: Internal Medicine

## 2019-06-21 ENCOUNTER — Ambulatory Visit (INDEPENDENT_AMBULATORY_CARE_PROVIDER_SITE_OTHER): Payer: Medicare Other

## 2019-06-21 VITALS — BP 137/84 | HR 71 | Temp 97.8°F | Ht 65.0 in | Wt 203.0 lb

## 2019-06-21 DIAGNOSIS — Z0000 Encounter for general adult medical examination without abnormal findings: Secondary | ICD-10-CM | POA: Diagnosis not present

## 2019-06-21 DIAGNOSIS — Z1211 Encounter for screening for malignant neoplasm of colon: Secondary | ICD-10-CM

## 2019-06-21 NOTE — Patient Instructions (Addendum)
Ashley Glover , Thank you for taking time to come for your Medicare Wellness Visit. I appreciate your ongoing commitment to your health goals. Please review the following plan we discussed and let me know if I can assist you in the future.   Screening recommendations/referrals: Colonoscopy: done 06/20/16. Referral sent to GI for repeat screening colonoscopy.  Mammogram: done 03/15/19 Bone Density: done 06/23/18 Recommended yearly ophthalmology/optometry visit for glaucoma screening and checkup Recommended yearly dental visit for hygiene and checkup  Vaccinations: Influenza vaccine: done - [facility] unable to provide date of vaccine.  Pneumococcal vaccine: due age 59 Tdap vaccine: due Shingles vaccine: Shingrix discussed. Please contact your pharmacy for coverage information.   Advanced directives: Advance directive discussed with you today. I have provided a copy for you to complete at home and have notarized. Once this is complete please bring a copy in to our office so we can scan it into your chart.  Conditions/risks identified: Recommend healthy eating and physical activity for desired weight loss  Next appointment: Please follow up in one year for your Medicare Annual Wellness visit.    Preventive Care 40-64 Years, Female Preventive care refers to lifestyle choices and visits with your health care provider that can promote health and wellness. What does preventive care include?  A yearly physical exam. This is also called an annual well check.  Dental exams once or twice a year.  Routine eye exams. Ask your health care provider how often you should have your eyes checked.  Personal lifestyle choices, including:  Daily care of your teeth and gums.  Regular physical activity.  Eating a healthy diet.  Avoiding tobacco and drug use.  Limiting alcohol use.  Practicing safe sex.  Taking low-dose aspirin daily starting at age 61.  Taking vitamin and mineral supplements as  recommended by your health care provider. What happens during an annual well check? 59 The services and screenings done by your health care provider during your annual well check will depend on your age 61, overall health, lifestyle risk factors, and family history of disease. Counseling  Your health care provider may ask you questions about your:  Alcohol use.  Tobacco use.  Drug use.  Emotional well-being.  Home and relationship well-being.  Sexual activity.  Eating habits.  Work and work Statistician.  Method of birth control.  Menstrual cycle.  Pregnancy history. Screening  You may have the following tests or measurements:  Height, weight, and BMI.  Blood pressure.  Lipid and cholesterol levels. These may be checked every 5 years, or more frequently if you are over 61 years old.  Skin check.  Lung cancer screening. You may have this screening every year starting at age 61 if you have a 30-pack-year history of smoking and currently smoke or have quit within the past 15 years.  Fecal occult blood test (FOBT) of the stool. You may have this test every year starting at age 61.  Flexible sigmoidoscopy or colonoscopy. You may have a sigmoidoscopy every 5 years or a colonoscopy every 10 years starting at age 26.  Hepatitis C blood test.  Hepatitis B blood test.  Sexually transmitted disease (STD) testing.  Diabetes screening. This is done by checking your blood sugar (glucose) after you have not eaten for a while (fasting). You may have this done every 1-3 years.  Mammogram. This may be done every 1-2 years. Talk to your health care provider about when you should start having regular mammograms. This may depend on whether you have  a family history of breast cancer.  BRCA-related cancer screening. This may be done if you have a family history of breast, ovarian, tubal, or peritoneal cancers.  Pelvic exam and Pap test. This may be done every 3 years starting at age 61.  Starting at age 61, this may be done every 5 years if you have a Pap test in combination with an HPV test.  Bone density scan. This is done to screen for osteoporosis. You may have this scan if you are at high risk for osteoporosis. Discuss your test results, treatment options, and if necessary, the need for more tests with your health care provider. Vaccines  Your health care provider may recommend certain vaccines, such as:  Influenza vaccine. This is recommended every year.  Tetanus, diphtheria, and acellular pertussis (Tdap, Td) vaccine. You may need a Td booster every 10 years.  Zoster vaccine. You may need this after age 61.  Pneumococcal 13-valent conjugate (PCV13) vaccine. You may need this if you have certain conditions and were not previously vaccinated.  Pneumococcal polysaccharide (PPSV23) vaccine. You may need one or two doses if you smoke cigarettes or if you have certain conditions. Talk to your health care provider about which screenings and vaccines you need and how often you need them. This information is not intended to replace advice given to you by your health care provider. Make sure you discuss any questions you have with your health care provider. Document Released: 06/09/2015 Document Revised: 01/31/2016 Document Reviewed: 03/14/2015 Elsevier Interactive Patient Education  2017 [place] Prevention in the Home Falls can cause injuries. They can happen to people of all ages. There are many things you can do to make your home safe and to help prevent falls. What can I do on the outside of my home?  Regularly fix the edges of walkways and driveways and fix any cracks.  Remove anything that might make you trip as you walk through a door, such as a raised step or threshold.  Trim any bushes or trees on the path to your home.  Use bright outdoor lighting.  Clear any walking paths of anything that might make someone trip, such as rocks or  tools.  Regularly check to see if handrails are loose or broken. Make sure that both sides of any steps have handrails.  Any raised decks and porches should have guardrails on the edges.  Have any leaves, snow, or ice cleared regularly.  Use sand or salt on walking paths during winter.  Clean up any spills in your garage right away. This includes oil or grease spills. What can I do in the bathroom?  Use night lights.  Install grab bars by the toilet and in the tub and shower. Do not use towel bars as grab bars.  Use non-skid mats or decals in the tub or shower.  If you need to sit down in the shower, use a plastic, non-slip stool.  Keep the floor dry. Clean up any water that spills on the floor as soon as it happens.  Remove soap buildup in the tub or shower regularly.  Attach bath mats securely with double-sided non-slip rug tape.  Do not have throw rugs and other things on the floor that can make you trip. What can I do in the bedroom?  Use night lights.  Make sure that you have a light by your bed that is easy to reach.  Do not use any sheets or blankets that  are too big for your bed. They should not hang down onto the floor.  Have a firm chair that has side arms. You can use this for support while you get dressed.  Do not have throw rugs and other things on the floor that can make you trip. What can I do in the kitchen?  Clean up any spills right away.  Avoid walking on wet floors.  Keep items that you use a lot in easy-to-reach places.  If you need to reach something above you, use a strong step stool that has a grab bar.  Keep electrical cords out of the way.  Do not use floor polish or wax that makes floors slippery. If you must use wax, use non-skid floor wax.  Do not have throw rugs and other things on the floor that can make you trip. What can I do with my stairs?  Do not leave any items on the stairs.  Make sure that there are handrails on both  sides of the stairs and use them. Fix handrails that are broken or loose. Make sure that handrails are as long as the stairways.  Check any carpeting to make sure that it is firmly attached to the stairs. Fix any carpet that is loose or worn.  Avoid having throw rugs at the top or bottom of the stairs. If you do have throw rugs, attach them to the floor with carpet tape.  Make sure that you have a light switch at the top of the stairs and the bottom of the stairs. If you do not have them, ask someone to add them for you. What else can I do to help prevent falls?  Wear shoes that:  Do not have high heels.  Have rubber bottoms.  Are comfortable and fit you well.  Are closed at the toe. Do not wear sandals.  If you use a stepladder:  Make sure that it is fully opened. Do not climb a closed stepladder.  Make sure that both sides of the stepladder are locked into place.  Ask someone to hold it for you, if possible.  Clearly mark and make sure that you can see:  Any grab bars or handrails.  First and last steps.  Where the edge of each step is.  Use tools that help you move around (mobility aids) if they are needed. These include:  Canes.  Walkers.  Scooters.  Crutches.  Turn on the lights when you go into a dark area. Replace any light bulbs as soon as they burn out.  Set up your furniture so you have a clear path. Avoid moving your furniture around.  If any of your floors are uneven, fix them.  If there are any pets around you, be aware of where they are.  Review your medicines with your doctor. Some medicines can make you feel dizzy. This can increase your chance of falling. Ask your doctor what other things that you can do to help prevent falls. This information is not intended to replace advice given to you by your health care provider. Make sure you discuss any questions you have with your health care provider. Document Released: 03/09/2009 Document Revised:  10/19/2015 Document Reviewed: 06/17/2014 Elsevier Interactive Patient Education  2017 [employer].

## 2019-06-21 NOTE — Progress Notes (Signed)
Subjective:   Ashley Glover is a 59 y.o. female who presents for an Initial Medicare Annual Wellness Visit.  Virtual Visit via Telephone Note  I connected with Ashley Glover on 06/21/19 at 10:00 AM EST by telephone and verified that I am speaking with the correct person using two identifiers.  Medicare Annual Wellness visit completed telephonically due to Covid-19 pandemic.   Location: Patient: home Provider: office   I discussed the limitations, risks, security and privacy concerns of performing an evaluation and management service by telephone and the availability of in person appointments. The patient expressed understanding and agreed to proceed.  Some vital signs may be absent or patient reported.   Clemetine Marker, LPN    Review of Systems      Cardiac Risk Factors include: advanced age (>51men, >7 women);dyslipidemia;hypertension;obesity (BMI >30kg/m2)     Objective:    Today's Vitals   06/21/19 1003  BP: 137/84  Pulse: 71  Temp: 97.8 F (36.6 C)  Weight: 203 lb (92.1 kg)  Height: 5\' 5"  (1.651 m)   Body mass index is 33.78 kg/m.  Advanced Directives 06/21/2019 06/20/2016 08/02/2015  Does Patient Have a Medical Advance Directive? No No No  Would patient like information on creating a medical advance directive? Yes (MAU/Ambulatory/Procedural Areas - Information given) No - Patient declined No - patient declined information    Current Medications (verified) Outpatient Encounter Medications as of 06/21/2019  Medication Sig  . albuterol (VENTOLIN HFA) 108 (90 Base) MCG/ACT inhaler Inhale 2 puffs into the lungs every 6 (six) hours as needed for wheezing or shortness of breath.  Marland Kitchen aspirin (ASPIRIN 81) 81 MG EC tablet Take 81 mg by mouth daily. Swallow whole.  . butalbital-acetaminophen-caffeine (FIORICET WITH CODEINE) 50-325-40-30 MG capsule Take 1 capsule by mouth every 6 (six) hours as needed for headache.  . calcium-vitamin D (OSCAL WITH D) 250-125 MG-UNIT tablet  Take 1 tablet by mouth daily.  . DULoxetine (CYMBALTA) 60 MG capsule TAKE 1 CAPSULE BY MOUTH  DAILY  . furosemide (LASIX) 40 MG tablet Take 1 tablet (40 mg total) by mouth daily.  Marland Kitchen gabapentin (NEURONTIN) 600 MG tablet TAKE 1 TABLET BY MOUTH 3  TIMES DAILY  . hydrocortisone 2.5 % cream APPLY TOPICALLY TWO TIMES   DAILY  . levothyroxine (SYNTHROID) 88 MCG tablet TAKE 1 TABLET BY MOUTH  DAILY BEFORE BREAKFAST  . loratadine (CLARITIN) 10 MG tablet Take 10 mg by mouth daily.  Marland Kitchen lovastatin (MEVACOR) 40 MG tablet TAKE 1 TABLET BY MOUTH AT  BEDTIME  . montelukast (SINGULAIR) 10 MG tablet TAKE 1 TABLET BY MOUTH AT  BEDTIME  . Multiple Vitamin (MULTIVITAMIN ADULT PO) Take 1 tablet by mouth daily.  . Omega-3 Fatty Acids (FISH OIL) 1000 MG CAPS Take by mouth.  Marland Kitchen omeprazole (PRILOSEC) 40 MG capsule TAKE 1 CAPSULE BY MOUTH  DAILY  . promethazine (PHENERGAN) 25 MG tablet Take 1 tablet (25 mg total) by mouth every 8 (eight) hours as needed for nausea or vomiting.  . propranolol (INDERAL) 40 MG tablet Take 1 tablet (40 mg total) by mouth 2 (two) times daily.  Marland Kitchen tiZANidine (ZANAFLEX) 4 MG tablet TAKE 1 TABLET BY MOUTH 3  TIMES DAILY  . tobramycin-dexamethasone (TOBRADEX) ophthalmic solution Place 1 drop into both eyes every 6 (six) hours.  . triamcinolone ointment (KENALOG) 0.1 % APPLY TWICE A DAY AS NEEDED FOR ITCHING. AVOID FACE, GROIN AND UNDERARMS  . [DISCONTINUED] mometasone (ELOCON) 0.1 % lotion   . [DISCONTINUED] traMADol (  ULTRAM) 50 MG tablet Take 50 mg by mouth every 6 (six) hours as needed.   No facility-administered encounter medications on file as of 06/21/2019.    Allergies (verified) Ace inhibitors, Morphine and related, and Savella [milnacipran hcl]   History: Past Medical History:  Diagnosis Date  . Allergic rhinitis   . Degenerative joint disease   . Fibromyalgia   . GERD (gastroesophageal reflux disease)   . Hyperlipidemia   . Hypertension   . Hypothyroid   . Mitral valve prolapse     states it is mild and does not see a cardiologist  . Sjogrens syndrome [facility])    Past Surgical History:  Procedure Laterality Date  . AUGMENTATION MAMMAPLASTY Bilateral 1987  . COLONOSCOPY  05/2012   polyps  . COLONOSCOPY WITH PROPOFOL N/A 06/20/2016   Procedure: COLONOSCOPY WITH PROPOFOL;  Surgeon: [name], MD;  Location: [place];  Service: Endoscopy;  Laterality: N/A;  . ESOPHAGOGASTRODUODENOSCOPY (EGD) WITH PROPOFOL N/A 06/20/2016   Procedure: ESOPHAGOGASTRODUODENOSCOPY (EGD) WITH PROPOFOL;  Surgeon: [name], MD;  Location: [place];  Service: Endoscopy;  Laterality: N/A;  . FOOT SURGERY Right   . GANGLION CYST EXCISION Left   . NASAL SINUS SURGERY    . NEUROMA SURGERY Left   . PARTIAL HYSTERECTOMY  1996   +HPV and cervical dysplasia  . PLACEMENT OF BREAST IMPLANTS    . POLYPECTOMY  06/20/2016   Procedure: POLYPECTOMY;  Surgeon: [name], MD;  Location: [place];  Service: Endoscopy;;  . REPLACEMENT TOTAL KNEE Right 06/2014  . TONSILLECTOMY     Family History  Problem Relation Age of Onset  . Pulmonary embolism Mother   . Hypertension Mother   . AVM Father   . Hypertension Father   . Hypothyroidism Father   . Breast cancer Neg Hx    Social History   Socioeconomic History  . Marital status: Divorced    Spouse name: Not on file  . Number of children: 1  . Years of education: Not on file  . Highest education level: Not on file  Occupational History  . Occupation: disabled  Tobacco Use  . Smoking status: Never Smoker  . Smokeless tobacco: Never Used  Substance and Sexual Activity  . Alcohol use: No    Alcohol/week: 0.0 standard drinks  . Drug use: No  . Sexual activity: Not on file  Other Topics Concern  . Not on file  Social History Narrative   Patient is on Soc Sec Disability.  She has been disabled for a number of years from Degenerative arthritis and Fibromyalgia.   Social Determinants of Health   Financial  Resource Strain: Low Risk   . Difficulty of Paying Living Expenses: Not hard at all  Food Insecurity: No Food Insecurity  . Worried About Charity fundraiser in the Last Year: Never true  . Ran Out of Food in the Last Year: Never true  Transportation Needs: No Transportation Needs  . Lack of Transportation (Medical): No  . Lack of Transportation (Non-Medical): No  Physical Activity: Insufficiently Active  . Days of Exercise per Week: 3 days  . Minutes of Exercise per Session: 30 min  Stress: No Stress Concern Present  . Feeling of Stress : Not at all  Social Connections: Unknown  . Frequency of Communication with Friends and Family: Patient refused  . Frequency of Social Gatherings with Friends and Family: Patient refused  . Attends Religious Services: Patient refused  . Active Member of Clubs  or Organizations: Patient refused  . Attends Archivist Meetings: Patient refused  . Marital Status: Divorced    Tobacco Counseling Counseling given: Not Answered   Clinical Intake:  Pre-visit preparation completed: Yes  Pain : No/denies pain     BMI - recorded: 33.78 Nutritional Status: BMI > 30  Obese Nutritional Risks: None Diabetes: No  How often do you need to have someone help you when you read instructions, pamphlets, or other written materials from your doctor or pharmacy?: 1 - Never  Interpreter Needed?: No  Information entered by :: Clemetine Marker LPN   Activities of Daily Living In your present state of health, do you have any difficulty performing the following activities: 06/21/2019 12/22/2018  Hearing? N N  Comment declines hearing aids -  Vision? N N  Difficulty concentrating or making decisions? N N  Walking or climbing stairs? N N  Dressing or bathing? N N  Doing errands, shopping? N N  Preparing Food and eating ? N -  Using the Toilet? N -  In the past six months, have you accidently leaked urine? N -  Do you have problems with loss of bowel  control? N -  Managing your Medications? N -  Housekeeping or managing your Housekeeping? N -  Some recent data might be hidden     Immunizations and Health Maintenance Immunization History  Administered Date(s) Administered  . Influenza,inj,Quad PF,6+ Mos 04/24/2016, 03/04/2017, 03/11/2018   Health Maintenance Due  Topic Date Due  . HIV Screening  04/20/1976  . TETANUS/TDAP  11/09/2018  . INFLUENZA VACCINE  12/26/2018  . COLONOSCOPY  06/21/2019    Patient Care Team: Glean Hess, MD as PCP - General (Internal Medicine) Vear Clock, MD as Referring Physician (Orthopedic Surgery) Colleen Can, MD as Referring Physician (Orthopedic Surgery)  Indicate any recent Medical Services you may have received from other than [facility] providers in the past year (date may be approximate).     Assessment:   This is a routine wellness examination for Esmarie.  Hearing/Vision screen  Hearing Screening   125Hz  250Hz  500Hz  1000Hz  2000Hz  3000Hz  4000Hz  6000Hz  8000Hz   Right ear:           Left ear:           Comments: Pt denies hearing difficulty   Vision Screening Comments: Annual vision screenings done by Dr. Edison [facility]  Dietary issues and exercise activities discussed: Current Exercise Habits: Home exercise routine, Type of exercise: Other - see comments(recumbent bike), Time (Minutes): 30, Frequency (Times/Week): 3, Weekly Exercise (Minutes/Week): 90, Intensity: Moderate, Exercise limited by: orthopedic condition(s);neurologic condition(s)  Goals    . Weight (lb) < 190 lb (86.2 kg)     Pt would like to lose weight with healthy eating and physical activity       Depression Screen PHQ 2/9 Scores 06/21/2019 12/22/2018 05/26/2018 05/01/2017 01/31/2016  PHQ - 2 Score 0 0 0 0 0  PHQ- 9 Score - 0 - - -    Fall Risk Fall Risk  06/21/2019 12/22/2018  Falls in the past year? 0 0  Number falls in past yr: 0 0  Injury with Fall? 0 0  Risk for fall due to : Impaired  mobility;Impaired balance/gait -  Follow up Falls prevention discussed Falls evaluation completed    FALL RISK PREVENTION PERTAINING TO THE HOME:  Any stairs in or around the home? Yes  If so, do they handrails? Yes   Home free of loose  throw rugs in walkways, pet beds, electrical cords, etc? Yes  Adequate lighting in your home to reduce risk of falls? Yes   ASSISTIVE DEVICES UTILIZED TO PREVENT FALLS:  Life alert? No  Use of a cane, walker or w/c? Yes  Grab bars in the bathroom? No  Shower chair or bench in shower? Yes  Elevated toilet seat or a handicapped toilet? Yes  DME ORDERS:  DME order needed?  No   TIMED UP AND GO:  Was the test performed? No . Telephonic visit.   Education: Fall risk prevention has been discussed.  Intervention(s) required? No    Cognitive Function:     6CIT Screen 06/21/2019  What Year? 0 points  What month? 0 points  What time? 0 points  Count back from 20 0 points  Months in reverse 0 points  Repeat phrase 0 points  Total Score 0    Screening Tests Health Maintenance  Topic Date Due  . HIV Screening  04/20/1976  . TETANUS/TDAP  11/09/2018  . INFLUENZA VACCINE  12/26/2018  . COLONOSCOPY  06/21/2019  . MAMMOGRAM  03/14/2020  . Hepatitis C Screening  Completed    Qualifies for Shingles Vaccine? Yes . Due for Shingrix. Education has been provided regarding the importance of this vaccine. Pt has been advised to call insurance company to determine out of pocket expense. Advised may also receive vaccine at local pharmacy or Health Dept. Verbalized acceptance and understanding.  Tdap: Although this vaccine is not a covered service during a Wellness Exam, does the patient still wish to receive this vaccine today?  No .  Education has been provided regarding the importance of this vaccine. Advised may receive this vaccine at local pharmacy or Health Dept. Aware to provide a copy of the vaccination record if obtained from local pharmacy or  Health Dept. Verbalized acceptance and understanding.  Flu Vaccine: Up to date per patient at [facility] Called [facility] and they did not have record of this being completed for this season.   Pneumococcal Vaccine: Due for Pneumococcal vaccine age 66.   Cancer Screenings:  Colorectal Screening: Completed 06/20/16. Repeat every 3 years; No longer required. Referral to GI placed today. Pt aware the office will call re: appt.  Mammogram: Completed 03/15/19. Repeat every year.  Bone Density: Completed 06/23/18. Results reflect  OSTEOPENIA. Repeat every 2 years.   Lung Cancer Screening: (Low Dose CT Chest recommended if Age 71-80 years, 30 pack-year currently smoking OR have quit w/in 15years.) does not qualify.    Additional Screening:  Hepatitis C Screening: does qualify; Completed 05/21/17  Vision Screening: Recommended annual ophthalmology exams for early detection of glaucoma and other disorders of the eye. Is the patient up to date with their annual eye exam?  Yes  Who is the provider or what is the name of the office in which the pt attends annual eye exams? [facility] Screening: Recommended annual dental exams for proper oral hygiene  Community Resource Referral:  CRR required this visit?  No      Plan:    I have personally reviewed and addressed the Medicare Annual Wellness questionnaire and have noted the following in the patient's chart:  A. Medical and social history B. Use of alcohol, tobacco or illicit drugs  C. Current medications and supplements D. Functional ability and status E.  Nutritional status F.  Physical activity G. Advance directives H. List of other physicians I.  Hospitalizations, surgeries, and ER visits in previous  12 months J.  [place] such as hearing and vision if needed, cognitive and depression L. Referrals and appointments   In addition, I have reviewed and discussed with patient certain preventive  protocols, quality metrics, and best practice recommendations. A written personalized care plan for preventive services as well as general preventive health recommendations were provided to patient.   Signed,  Clemetine Marker, LPN Nurse Health Advisor   Nurse Notes: none

## 2019-06-25 ENCOUNTER — Other Ambulatory Visit: Payer: Self-pay

## 2019-06-25 ENCOUNTER — Telehealth: Payer: Self-pay

## 2019-06-25 ENCOUNTER — Ambulatory Visit (INDEPENDENT_AMBULATORY_CARE_PROVIDER_SITE_OTHER): Payer: Medicare Other | Admitting: Internal Medicine

## 2019-06-25 ENCOUNTER — Encounter: Payer: Self-pay | Admitting: Internal Medicine

## 2019-06-25 VITALS — BP 136/86 | HR 80 | Temp 98.6°F | Ht 65.0 in | Wt 205.0 lb

## 2019-06-25 DIAGNOSIS — M797 Fibromyalgia: Secondary | ICD-10-CM | POA: Diagnosis not present

## 2019-06-25 DIAGNOSIS — Z1231 Encounter for screening mammogram for malignant neoplasm of breast: Secondary | ICD-10-CM

## 2019-06-25 DIAGNOSIS — I10 Essential (primary) hypertension: Secondary | ICD-10-CM | POA: Diagnosis not present

## 2019-06-25 DIAGNOSIS — G43C0 Periodic headache syndromes in child or adult, not intractable: Secondary | ICD-10-CM | POA: Diagnosis not present

## 2019-06-25 DIAGNOSIS — Z86010 Personal history of colonic polyps: Secondary | ICD-10-CM

## 2019-06-25 DIAGNOSIS — E030 Congenital hypothyroidism with diffuse goiter: Secondary | ICD-10-CM | POA: Diagnosis not present

## 2019-06-25 DIAGNOSIS — Z0000 Encounter for general adult medical examination without abnormal findings: Secondary | ICD-10-CM

## 2019-06-25 DIAGNOSIS — Z23 Encounter for immunization: Secondary | ICD-10-CM | POA: Diagnosis not present

## 2019-06-25 DIAGNOSIS — E559 Vitamin D deficiency, unspecified: Secondary | ICD-10-CM | POA: Diagnosis not present

## 2019-06-25 DIAGNOSIS — E782 Mixed hyperlipidemia: Secondary | ICD-10-CM | POA: Diagnosis not present

## 2019-06-25 DIAGNOSIS — G4700 Insomnia, unspecified: Secondary | ICD-10-CM | POA: Insufficient documentation

## 2019-06-25 DIAGNOSIS — E034 Atrophy of thyroid (acquired): Secondary | ICD-10-CM | POA: Diagnosis not present

## 2019-06-25 DIAGNOSIS — G4701 Insomnia due to medical condition: Secondary | ICD-10-CM

## 2019-06-25 LAB — POCT URINALYSIS DIPSTICK
Bilirubin, UA: NEGATIVE
Blood, UA: NEGATIVE
Glucose, UA: NEGATIVE
Ketones, UA: NEGATIVE
Leukocytes, UA: NEGATIVE
Nitrite, UA: NEGATIVE
Protein, UA: NEGATIVE
Spec Grav, UA: 1.015 (ref 1.010–1.025)
Urobilinogen, UA: 0.2 E.U./dL
pH, UA: 5 (ref 5.0–8.0)

## 2019-06-25 MED ORDER — ZOLPIDEM TARTRATE ER 6.25 MG PO TBCR: 6.2500 mg | EXTENDED_RELEASE_TABLET | Freq: Every evening | ORAL | 0 refills | Status: DC | PRN

## 2019-06-25 MED ORDER — SHINGRIX 50 MCG/0.5ML IM SUSR: 0.5000 mL | Freq: Once | INTRAMUSCULAR | 1 refills | Status: AC

## 2019-06-25 NOTE — Progress Notes (Signed)
Date:  06/25/2019   Name:  Ashley Glover   DOB:  1960/05/31   MRN:  [id number]   Chief Complaint: Annual Exam ([place] in Oct- Declined breast exam. No pap- hystero.) [name] ORDERS is a 59 y.o. female who presents today for her Complete Annual Exam. She feels fairly well. She reports exercising rarely. She reports she is sleeping poorly. Recent mammogram was normal. She denies breast issues. She would like to have the Shingrix vaccines.  Mammogram  02/2019 Pap discontinued Colonoscopy 2018 - TA needs repeat Immunization History  Administered Date(s) Administered  . Influenza,inj,Quad PF,6+ Mos 04/24/2016, 03/04/2017, 03/11/2018  . Influenza-Unspecified 03/28/2019    Hypertension This is a chronic problem. The problem is resistant (with high diastolic). Associated symptoms include headaches and palpitations. Pertinent negatives include no chest pain or shortness of breath (at times not related to palpitations or stress). Identifiable causes of hypertension include a thyroid problem.  Hyperlipidemia Associated symptoms include myalgias. Pertinent negatives include no chest pain or shortness of breath (at times not related to palpitations or stress).  Thyroid Problem Symptoms include fatigue and palpitations. Patient reports no anxiety or constipation. Her past medical history is significant for hyperlipidemia.  Headache  Associated symptoms include insomnia. Pertinent negatives include no abdominal pain, coughing, dizziness, fever, numbness, sinus pressure or sore throat. Her past medical history is significant for hypertension.  Insomnia Primary symptoms: sleep disturbance, difficulty falling asleep, frequent awakening.  The current episode started more than one year. The onset quality is undetermined. The problem occurs nightly. The symptoms are aggravated by pain. Treatments tried: may have tried Ambien years ago.    Lab Results  Component Value Date   CREATININE 0.88 12/22/2018    BUN 11 12/22/2018   NA 140 12/22/2018   K 4.9 12/22/2018   CL 98 12/22/2018   CO2 26 12/22/2018   Lab Results  Component Value Date   CHOL 232 (H) 05/26/2018   HDL 83 05/26/2018   LDLCALC 126 (H) 05/26/2018   TRIG 117 05/26/2018   CHOLHDL 2.8 05/26/2018   Lab Results  Component Value Date   TSH 2.100 12/22/2018   Lab Results  Component Value Date   HGBA1C 5.7 (H) 05/26/2018     Review of Systems  Constitutional: Positive for fatigue. Negative for chills, fever and unexpected weight change.  HENT: Positive for postnasal drip. Negative for sinus pressure, sore throat, trouble swallowing and voice change.   Eyes: Negative for visual disturbance.  Respiratory: Negative for cough, chest tightness, shortness of breath (at times not related to palpitations or stress) and wheezing.   Cardiovascular: Positive for palpitations. Negative for chest pain.  Gastrointestinal: Negative for abdominal pain, blood in stool and constipation.  Endocrine: Negative for polydipsia and polyuria.  Genitourinary: Negative for dysuria, hematuria and urgency.  Musculoskeletal: Positive for arthralgias and myalgias.  Skin: Negative for color change and rash.  Allergic/Immunologic: Positive for environmental allergies.  Neurological: Positive for headaches. Negative for dizziness, syncope, light-headedness and numbness.  Psychiatric/Behavioral: Positive for sleep disturbance. Negative for dysphoric mood. The patient has insomnia. The patient is not nervous/anxious.     Patient Active Problem List   Diagnosis Date Noted  . Osteopenia determined by x-ray 06/23/2018  . Chronic otitis media 09/30/2017  . Migraine without aura and without status migrainosus, not intractable 05/21/2017  . Not currently working due to disabled status 01/24/2017  . Degenerative arthritis 01/24/2017  . Frequent PVCs 08/20/2016  . Benign neoplasm of ascending colon   .  Benign neoplasm of cecum   . Hx of colonic polyps  04/24/2016  . Periodic headache syndrome, not intractable 01/31/2016  . Rosacea, acne 01/31/2016  . Essential hypertension 08/02/2015  . Hypothyroidism due to acquired atrophy of thyroid 08/02/2015  . Fibromyalgia 08/02/2015  . Sjogren's syndrome ([place]) 08/02/2015  . Peptic ulcer disease 08/02/2015  . Primary osteoarthritis of both knees 08/02/2015  . Osteopenia 08/02/2015  . Other allergic rhinitis 08/02/2015  . Hyperlipidemia, mixed 08/02/2015  . Hx of abnormal cervical Pap smear 08/02/2015    Allergies  Allergen Reactions  . Ace Inhibitors Cough  . Morphine And Related Nausea Only  . Elwyn Reach Hcl]     Past Surgical History:  Procedure Laterality Date  . AUGMENTATION MAMMAPLASTY Bilateral 1987  . COLONOSCOPY  05/2012   polyps  . COLONOSCOPY WITH PROPOFOL N/A 06/20/2016   Procedure: COLONOSCOPY WITH PROPOFOL;  Surgeon: [name], MD;  Location: [place];  Service: Endoscopy;  Laterality: N/A;  . ESOPHAGOGASTRODUODENOSCOPY (EGD) WITH PROPOFOL N/A 06/20/2016   Procedure: ESOPHAGOGASTRODUODENOSCOPY (EGD) WITH PROPOFOL;  Surgeon: [name], MD;  Location: [place];  Service: Endoscopy;  Laterality: N/A;  . FOOT SURGERY Right   . GANGLION CYST EXCISION Left   . NASAL SINUS SURGERY    . NEUROMA SURGERY Left   . PARTIAL HYSTERECTOMY  1996   +HPV and cervical dysplasia  . PLACEMENT OF BREAST IMPLANTS    . POLYPECTOMY  06/20/2016   Procedure: POLYPECTOMY;  Surgeon: [name], MD;  Location: [place];  Service: Endoscopy;;  . REPLACEMENT TOTAL KNEE Right 06/2014  . TONSILLECTOMY      Social History   Tobacco Use  . Smoking status: Never Smoker  . Smokeless tobacco: Never Used  Substance Use Topics  . Alcohol use: No    Alcohol/week: 0.0 standard drinks  . Drug use: No     Medication list has been reviewed and updated.  Current Meds  Medication Sig  . albuterol (VENTOLIN HFA) 108 (90 Base) MCG/ACT inhaler Inhale 2 puffs  into the lungs every 6 (six) hours as needed for wheezing or shortness of breath.  Marland Kitchen aspirin (ASPIRIN 81) 81 MG EC tablet Take 81 mg by mouth daily. Swallow whole.  . butalbital-acetaminophen-caffeine (FIORICET WITH CODEINE) 50-325-40-30 MG capsule Take 1 capsule by mouth every 6 (six) hours as needed for headache.  . calcium-vitamin D (OSCAL WITH D) 250-125 MG-UNIT tablet Take 1 tablet by mouth daily.  . DULoxetine (CYMBALTA) 60 MG capsule TAKE 1 CAPSULE BY MOUTH  DAILY  . furosemide (LASIX) 40 MG tablet Take 1 tablet (40 mg total) by mouth daily.  Marland Kitchen gabapentin (NEURONTIN) 600 MG tablet TAKE 1 TABLET BY MOUTH 3  TIMES DAILY  . hydrocortisone 2.5 % cream APPLY TOPICALLY TWO TIMES   DAILY  . levothyroxine (SYNTHROID) 88 MCG tablet TAKE 1 TABLET BY MOUTH  DAILY BEFORE BREAKFAST  . loratadine (CLARITIN) 10 MG tablet Take 10 mg by mouth daily.  Marland Kitchen lovastatin (MEVACOR) 40 MG tablet TAKE 1 TABLET BY MOUTH AT  BEDTIME  . montelukast (SINGULAIR) 10 MG tablet TAKE 1 TABLET BY MOUTH AT  BEDTIME  . Multiple Vitamin (MULTIVITAMIN ADULT PO) Take 1 tablet by mouth daily.  . Omega-3 Fatty Acids (FISH OIL) 1000 MG CAPS Take by mouth.  Marland Kitchen omeprazole (PRILOSEC) 40 MG capsule TAKE 1 CAPSULE BY MOUTH  DAILY  . promethazine (PHENERGAN) 25 MG tablet Take 1 tablet (25 mg total) by mouth every 8 (eight) hours as needed  for nausea or vomiting.  . propranolol (INDERAL) 40 MG tablet Take 1 tablet (40 mg total) by mouth 2 (two) times daily.  Marland Kitchen tiZANidine (ZANAFLEX) 4 MG tablet TAKE 1 TABLET BY MOUTH 3  TIMES DAILY  . tobramycin-dexamethasone (TOBRADEX) ophthalmic solution Place 1 drop into both eyes every 6 (six) hours.    PHQ 2/9 Scores 06/21/2019 12/22/2018 05/26/2018 05/01/2017  PHQ - 2 Score 0 0 0 0  PHQ- 9 Score - 0 - -    BP Readings from Last 3 Encounters:  06/25/19 136/86  06/21/19 137/84  12/22/18 116/78    Physical Exam Vitals and nursing note reviewed.  Constitutional:      General: She is not in acute  distress.    Appearance: She is well-developed.  HENT:     Head: Normocephalic and atraumatic.     Right Ear: Tympanic membrane and ear canal normal.     Left Ear: Tympanic membrane and ear canal normal.     Nose:     Right Sinus: No maxillary sinus tenderness.     Left Sinus: No maxillary sinus tenderness.  Eyes:     General: No scleral icterus.       Right eye: No discharge.        Left eye: No discharge.     Conjunctiva/sclera: Conjunctivae normal.  Neck:     Thyroid: No thyromegaly.     Vascular: No carotid bruit.  Cardiovascular:     Rate and Rhythm: Normal rate and regular rhythm.     Pulses: Normal pulses.     Heart sounds: Normal heart sounds.  Pulmonary:     Effort: Pulmonary effort is normal. No respiratory distress.     Breath sounds: No wheezing.  Abdominal:     General: Bowel sounds are normal.     Palpations: Abdomen is soft.     Tenderness: There is no abdominal tenderness.  Musculoskeletal:        General: Normal range of motion.     Cervical back: Normal range of motion. No erythema.  Lymphadenopathy:     Cervical: No cervical adenopathy.  Skin:    General: Skin is warm and dry.     Findings: No rash.  Neurological:     Mental Status: She is alert and oriented to person, place, and time.     Cranial Nerves: No cranial nerve deficit.     Sensory: No sensory deficit.     Deep Tendon Reflexes: Reflexes are normal and symmetric.  Psychiatric:        Speech: Speech normal.        Behavior: Behavior normal.        Thought Content: Thought content normal.     Wt Readings from Last 3 Encounters:  06/25/19 205 lb (93 kg)  06/21/19 203 lb (92.1 kg)  12/22/18 204 lb (92.5 kg)    BP 136/86   Pulse 80   Temp 98.6 F (37 C) (Oral)   Ht 5\' 5"  (1.651 m)   Wt 205 lb (93 kg)   SpO2 98%   BMI 34.11 kg/m   Assessment and Plan: 1. Annual physical exam Normal exam except for weight Continue healthy diet, exercise as able - POCT urinalysis dipstick  2.  Encounter for screening mammogram for breast cancer To be scheduled  3. Essential hypertension Clinically stable exam with well controlled BP on beta blocker and lasix. Tolerating medications without side effects at this time. Pt to continue current regimen and low sodium  diet; benefits of regular exercise as able discussed. - CBC with Differential/Platelet - Comprehensive metabolic panel  4. Hypothyroidism due to acquired atrophy of thyroid Supplemented, no symptoms to suggest poor control - TSH + free T4  5. Hyperlipidemia, mixed Tolerating statin medication without side effects at this time Continue same therapy without change at this time. - Lipid panel  6. Periodic headache syndrome, not intractable Stable  7. Fibromyalgia Chronic persistent symptoms of pain and fatigue Continue cymbalta and gabapentin - VITAMIN D 25 Hydroxy (Vit-D Deficiency, Fractures)  8. Hx of colonic polyps To be scheduled  9. Insomnia due to medical condition Will add low dose ambien - precautions given - zolpidem (AMBIEN CR) 6.25 MG CR tablet; Take 1 tablet (6.25 mg total) by mouth at bedtime as needed for sleep.  Dispense: 30 tablet; Refill: 0  10. Need for shingles vaccine - Zoster Vaccine Adjuvanted [facility]) injection; Inject 0.5 mLs into the muscle once for 1 dose.  Dispense: 0.5 mL; Refill: 1   Partially dictated using Editor, commissioning. Any errors are unintentional.  Halina Maidens, MD [facility]  06/25/2019

## 2019-06-25 NOTE — Telephone Encounter (Signed)
Pt called saying Ambien is 45 dollars for 30 days at her regular pharmacy. She is going to shop around and find a cheaper pharmacy to get this Rx and will call Monday to have it send to diff pharmacy.   Ok per LB.

## 2019-06-26 LAB — COMPREHENSIVE METABOLIC PANEL
ALT: 18 IU/L (ref 0–32)
AST: 24 IU/L (ref 0–40)
Albumin/Globulin Ratio: 1.6 (ref 1.2–2.2)
Albumin: 4.3 g/dL (ref 3.8–4.9)
Alkaline Phosphatase: 95 IU/L (ref 39–117)
BUN/Creatinine Ratio: 13 (ref 12–28)
BUN: 13 mg/dL (ref 8–27)
Bilirubin Total: 0.4 mg/dL (ref 0.0–1.2)
CO2: 25 mmol/L (ref 20–29)
Calcium: 10.2 mg/dL (ref 8.7–10.3)
Chloride: 98 mmol/L (ref 96–106)
Creatinine, Ser: 0.97 mg/dL (ref 0.57–1.00)
GFR calc Af Amer: 73 mL/min/{1.73_m2} (ref >59)
GFR calc non Af Amer: 64 mL/min/{1.73_m2} (ref >59)
Globulin, Total: 2.7 g/dL (ref 1.5–4.5)
Glucose: 104 mg/dL — ABNORMAL HIGH (ref 65–99)
Potassium: 5.5 mmol/L — ABNORMAL HIGH (ref 3.5–5.2)
Sodium: 137 mmol/L (ref 134–144)
Total Protein: 7 g/dL (ref 6.0–8.5)

## 2019-06-26 LAB — TSH+FREE T4
Free T4: 1.41 ng/dL (ref 0.82–1.77)
TSH: 3.94 u[IU]/mL (ref 0.450–4.500)

## 2019-06-26 LAB — CBC WITH DIFFERENTIAL/PLATELET
Basophils Absolute: 0.1 10*3/uL (ref 0.0–0.2)
Basos: 1 %
EOS (ABSOLUTE): 0.4 10*3/uL (ref 0.0–0.4)
Eos: 4 %
Hematocrit: 42 % (ref 34.0–46.6)
Hemoglobin: 13.8 g/dL (ref 11.1–15.9)
Immature Grans (Abs): 0 10*3/uL (ref 0.0–0.1)
Immature Granulocytes: 0 %
Lymphocytes Absolute: 2 10*3/uL (ref 0.7–3.1)
Lymphs: 24 %
MCH: 29.1 pg (ref 26.6–33.0)
MCHC: 32.9 g/dL (ref 31.5–35.7)
MCV: 89 fL (ref 79–97)
Monocytes Absolute: 0.7 10*3/uL (ref 0.1–0.9)
Monocytes: 8 %
Neutrophils Absolute: 5.3 10*3/uL (ref 1.4–7.0)
Neutrophils: 63 %
Platelets: 250 10*3/uL (ref 150–450)
RBC: 4.74 x10E6/uL (ref 3.77–5.28)
RDW: 12.1 % (ref 11.7–15.4)
WBC: 8.4 10*3/uL (ref 3.4–10.8)

## 2019-06-26 LAB — LIPID PANEL
Chol/HDL Ratio: 4 ratio (ref 0.0–4.4)
Cholesterol, Total: 224 mg/dL — ABNORMAL HIGH (ref 100–199)
HDL: 56 mg/dL (ref >39)
LDL Chol Calc (NIH): 118 mg/dL — ABNORMAL HIGH (ref 0–99)
Triglycerides: 288 mg/dL — ABNORMAL HIGH (ref 0–149)
VLDL Cholesterol Cal: 50 mg/dL — ABNORMAL HIGH (ref 5–40)

## 2019-06-26 LAB — VITAMIN D 25 HYDROXY (VIT D DEFICIENCY, FRACTURES): Vit D, 25-Hydroxy: 33.5 ng/mL (ref 30.0–100.0)

## 2019-06-28 ENCOUNTER — Telehealth: Payer: Self-pay

## 2019-06-28 ENCOUNTER — Other Ambulatory Visit: Payer: Self-pay | Admitting: Internal Medicine

## 2019-06-28 DIAGNOSIS — G4701 Insomnia due to medical condition: Secondary | ICD-10-CM

## 2019-06-28 MED ORDER — ZOLPIDEM TARTRATE ER 6.25 MG PO TBCR: 6.2500 mg | EXTENDED_RELEASE_TABLET | Freq: Every evening | ORAL | 0 refills | Status: DC | PRN

## 2019-06-28 NOTE — Telephone Encounter (Signed)
Ambien Cr #[id number] sent to [facility] in [place].   Please advise.

## 2019-06-29 ENCOUNTER — Encounter: Payer: Self-pay | Admitting: *Deleted

## 2019-07-12 DIAGNOSIS — M67371 Transient synovitis, right ankle and foot: Secondary | ICD-10-CM | POA: Diagnosis not present

## 2019-07-12 DIAGNOSIS — M1712 Unilateral primary osteoarthritis, left knee: Secondary | ICD-10-CM | POA: Diagnosis not present

## 2019-07-12 DIAGNOSIS — M67372 Transient synovitis, left ankle and foot: Secondary | ICD-10-CM | POA: Diagnosis not present

## 2019-07-12 DIAGNOSIS — M7051 Other bursitis of knee, right knee: Secondary | ICD-10-CM | POA: Diagnosis not present

## 2019-07-20 ENCOUNTER — Other Ambulatory Visit: Payer: Self-pay | Admitting: Internal Medicine

## 2019-07-20 DIAGNOSIS — I10 Essential (primary) hypertension: Secondary | ICD-10-CM

## 2019-07-29 DIAGNOSIS — I10 Essential (primary) hypertension: Secondary | ICD-10-CM | POA: Diagnosis not present

## 2019-07-29 DIAGNOSIS — I493 Ventricular premature depolarization: Secondary | ICD-10-CM | POA: Diagnosis not present

## 2019-07-29 DIAGNOSIS — E782 Mixed hyperlipidemia: Secondary | ICD-10-CM | POA: Diagnosis not present

## 2019-08-17 DIAGNOSIS — I10 Essential (primary) hypertension: Secondary | ICD-10-CM | POA: Diagnosis not present

## 2019-09-27 ENCOUNTER — Ambulatory Visit (INDEPENDENT_AMBULATORY_CARE_PROVIDER_SITE_OTHER): Payer: Medicare Other | Admitting: Gastroenterology

## 2019-09-27 ENCOUNTER — Other Ambulatory Visit: Payer: Self-pay

## 2019-09-27 VITALS — BP 107/72 | HR 76 | Temp 97.4°F | Ht 65.0 in | Wt 202.8 lb

## 2019-09-27 DIAGNOSIS — K219 Gastro-esophageal reflux disease without esophagitis: Secondary | ICD-10-CM | POA: Diagnosis not present

## 2019-09-27 DIAGNOSIS — K2100 Gastro-esophageal reflux disease with esophagitis, without bleeding: Secondary | ICD-10-CM

## 2019-09-27 NOTE — Progress Notes (Signed)
Gastroenterology Consultation  Referring Provider:     Glean Hess, MD Primary Care Physician:  Glean Hess, MD Primary Gastroenterologist:  Dr. Allen Norris     Reason for Consultation:     GERD and colon polyps        HPI:   Ashley Glover is a 59 y.o. y/o female referred for consultation & management of GERD and colon polyps by Dr. Army Melia, Jesse Sans, MD.  This patient comes in today after seeing me in January 2018 for a colonoscopy and EGD.  At that time the patient was found to have multiple adenomas in her colon and candidal esophagitis.  The patient was recommended to have a repeat colonoscopy in 3 years. The patient reports that she was doing very well on Dexilant but it was not covered by her insurance and it was very expensive.  She now is on omeprazole with some intermittent acid breakthrough.  There is no report of any unexplained weight loss fevers chills nausea vomiting.  The patient also denies any abdominal pain at the present time.  Past Medical History:  Diagnosis Date  . Allergic rhinitis   . Degenerative joint disease   . Fibromyalgia   . GERD (gastroesophageal reflux disease)   . Hyperlipidemia   . Hypertension   . Hypothyroid   . Mitral valve prolapse    states it is mild and does not see a cardiologist  . Redness of both eyes 09/30/2017  . Sjogrens syndrome [facility])     Past Surgical History:  Procedure Laterality Date  . AUGMENTATION MAMMAPLASTY Bilateral 1987  . COLONOSCOPY  05/2012   polyps  . COLONOSCOPY WITH PROPOFOL N/A 06/20/2016   Procedure: COLONOSCOPY WITH PROPOFOL;  Surgeon: [name], MD;  Location: [place];  Service: Endoscopy;  Laterality: N/A;  . ESOPHAGOGASTRODUODENOSCOPY (EGD) WITH PROPOFOL N/A 06/20/2016   Procedure: ESOPHAGOGASTRODUODENOSCOPY (EGD) WITH PROPOFOL;  Surgeon: [name], MD;  Location: [place];  Service: Endoscopy;  Laterality: N/A;  . FOOT SURGERY Right   . GANGLION CYST EXCISION Left   . NASAL  SINUS SURGERY    . NEUROMA SURGERY Left   . PARTIAL HYSTERECTOMY  1996   +HPV and cervical dysplasia  . PLACEMENT OF BREAST IMPLANTS    . POLYPECTOMY  06/20/2016   Procedure: POLYPECTOMY;  Surgeon: [name], MD;  Location: [facility];  Service: Endoscopy;;  . REPLACEMENT TOTAL KNEE Right 06/2014  . TONSILLECTOMY      Prior to Admission medications   Medication Sig Start Date End Date Taking? Authorizing Provider  albuterol (VENTOLIN HFA) 108 (90 Base) MCG/ACT inhaler Inhale 2 puffs into the lungs every 6 (six) hours as needed for wheezing or shortness of breath. 12/22/18   Glean Hess, MD  aspirin (ASPIRIN 81) 81 MG EC tablet Take 81 mg by mouth daily. Swallow whole.    [provider]  butalbital-acetaminophen-caffeine (FIORICET WITH CODEINE) [phone] MG capsule Take 1 capsule by mouth every 6 (six) hours as needed for headache. 12/22/18   Glean Hess, MD  calcium-vitamin D (OSCAL WITH D) 250-125 MG-UNIT tablet Take 1 tablet by mouth daily.    [provider]  DULoxetine (CYMBALTA) 60 MG capsule TAKE 1 CAPSULE BY MOUTH  DAILY 02/04/19   Glean Hess, MD  furosemide (LASIX) 40 MG tablet Take 1 tablet (40 mg total) by mouth daily. 05/12/17   Glean Hess, MD  gabapentin (NEURONTIN) 600 MG tablet TAKE 1 TABLET BY MOUTH  3  TIMES DAILY 04/22/19   Glean Hess, MD  hydrocortisone 2.5 % cream APPLY TOPICALLY TWO TIMES   DAILY 07/20/19   Glean Hess, MD  levothyroxine (SYNTHROID) 88 MCG tablet TAKE 1 TABLET BY MOUTH  DAILY BEFORE BREAKFAST 04/22/19   Glean Hess, MD  loratadine (CLARITIN) 10 MG tablet Take 10 mg by mouth daily.    [provider]  lovastatin (MEVACOR) 40 MG tablet TAKE 1 TABLET BY MOUTH AT  BEDTIME 04/22/19   Glean Hess, MD  montelukast (SINGULAIR) 10 MG tablet TAKE 1 TABLET BY MOUTH AT  BEDTIME 04/22/19   Glean Hess, MD  Multiple Vitamin (MULTIVITAMIN ADULT PO) Take 1 tablet by mouth daily.     [provider]  Omega-3 Fatty Acids (FISH OIL) 1000 MG CAPS Take by mouth.    [provider]  omeprazole (PRILOSEC) 40 MG capsule TAKE 1 CAPSULE BY MOUTH  DAILY 04/22/19   Glean Hess, MD  promethazine (PHENERGAN) 25 MG tablet Take 1 tablet (25 mg total) by mouth every 8 (eight) hours as needed for nausea or vomiting. 12/22/18   Glean Hess, MD  propranolol (INDERAL) 40 MG tablet TAKE 1 TABLET BY MOUTH  TWICE DAILY 07/20/19   Glean Hess, MD  tiZANidine (ZANAFLEX) 4 MG tablet TAKE 1 TABLET BY MOUTH 3  TIMES DAILY 05/04/19   Glean Hess, MD  tobramycin-dexamethasone [facility] ophthalmic solution Place 1 drop into both eyes every 6 (six) hours. 12/22/18   Glean Hess, MD  zolpidem (AMBIEN CR) 6.25 MG CR tablet Take 1 tablet (6.25 mg total) by mouth at bedtime as needed for sleep. 06/28/19   Glean Hess, MD    Family History  Problem Relation Age of Onset  . Pulmonary embolism Mother   . Hypertension Mother   . AVM Father   . Hypertension Father   . Hypothyroidism Father   . Breast cancer Neg Hx      Social History   Tobacco Use  . Smoking status: Never Smoker  . Smokeless tobacco: Never Used  Substance Use Topics  . Alcohol use: No    Alcohol/week: 0.0 standard drinks  . Drug use: No    Allergies as of 09/27/2019 - Review Complete 06/25/2019  Allergen Reaction Noted  . Ace inhibitors Cough 11/21/2017  . Morphine and related Nausea Only 08/02/2015  . Savella [milnacipran hcl]  08/02/2015    Review of Systems:    All systems reviewed and negative except where noted in HPI.   Physical Exam:  There were no vitals taken for this visit. No LMP recorded. Patient has had a hysterectomy. General:   Alert,  Well-developed, well-nourished, pleasant and cooperative in NAD Head:  Normocephalic and atraumatic. Eyes:  Sclera clear, no icterus.   Conjunctiva pink. Ears:  Normal auditory acuity. Neck:  Supple; no masses or  thyromegaly. Lungs:  Respirations even and unlabored.  Clear throughout to auscultation.   No wheezes, crackles, or rhonchi. No acute distress. Heart:  Regular rate and rhythm; no murmurs, clicks, rubs, or gallops. Abdomen:  Normal bowel sounds.  No bruits.  Soft, non-tender and non-distended without masses, hepatosplenomegaly or hernias noted.  No guarding or rebound tenderness.  Negative Carnett sign.   Rectal:  Deferred.  Pulses:  Normal pulses noted. Extremities:  No clubbing or edema.  No cyanosis. Neurologic:  Alert and oriented x3;  grossly normal neurologically. Skin:  Intact without significant lesions or rashes.  No jaundice.  Lymph Nodes:  No significant cervical adenopathy. Psych:  Alert and cooperative. Normal mood and affect.  Imaging Studies: No results found.  Assessment and Plan:   SENORA KETCHUM is a 59 y.o. y/o female who comes in today with a history of adenomatous polyps and a repeat colonoscopy due at this time.  The patient also had candidal esophagitis at her last EGD and has had some breakthrough symptoms.  The patient will be started on Protonix 40 mg a day and she will be set up for an EGD and colonoscopy. I have discussed risks & benefits which include, but are not limited to, bleeding, infection, perforation & drug reaction.  The patient agrees with this plan & written consent will be obtained.         [name], MD. Marval Regal    Note: This dictation was prepared with Dragon dictation along with smaller phrase technology. Any transcriptional errors that result from this process are unintentional.

## 2019-09-29 ENCOUNTER — Other Ambulatory Visit: Payer: Self-pay

## 2019-09-29 MED ORDER — PANTOPRAZOLE SODIUM 40 MG PO TBEC: 40.0000 mg | DELAYED_RELEASE_TABLET | Freq: Every day | ORAL | 3 refills | Status: DC

## 2019-10-14 ENCOUNTER — Other Ambulatory Visit: Payer: Self-pay | Admitting: Internal Medicine

## 2019-10-14 DIAGNOSIS — G43C0 Periodic headache syndromes in child or adult, not intractable: Secondary | ICD-10-CM

## 2019-10-14 NOTE — Telephone Encounter (Signed)
Requested medication (s) are due for refill today- no RF on script  Requested medication (s) are on the active medication list -yes  Future visit scheduled -yes  Last refill: 12/24/18  Notes to clinic: Request for non delegated Rx  Requested Prescriptions  Pending Prescriptions Disp Refills   butalbital-acetaminophen-caffeine (FIORICET WITH CODEINE) 50-325-40-30 MG capsule [Pharmacy Med Name: BUTALB-CAFF-ACETAMINOPH-CODEIN] 60 capsule     Sig: Take 1 capsule by mouth every 6 (six) hours as needed for headache.      Not Delegated - Analgesics:  Opioid Agonist Combinations Failed - 10/14/2019 10:30 AM      Failed - This refill cannot be delegated      Failed - Urine Drug Screen completed in last 360 days.      Passed - Valid encounter within last 6 months    Recent Outpatient Visits           3 months ago Annual physical exam   [facility] Glean Hess, MD   9 months ago Essential hypertension   [facility] Glean Hess, MD   1 year ago Annual physical exam   [facility] Glean Hess, MD   1 year ago Essential hypertension   [facility] Glean Hess, MD   2 years ago Chronic otitis media, unspecified otitis media type   [facility] Glean Hess, MD       Future Appointments             In 9 months Army Melia Jesse Sans, MD [facility], [facility]                Requested Prescriptions  Pending Prescriptions Disp Refills   butalbital-acetaminophen-caffeine (FIORICET WITH CODEINE) 50-325-40-30 MG capsule [Pharmacy Med Name: BUTALB-CAFF-ACETAMINOPH-CODEIN] 60 capsule     Sig: Take 1 capsule by mouth every 6 (six) hours as needed for headache.      Not Delegated - Analgesics:  Opioid Agonist Combinations Failed - 10/14/2019 10:30 AM      Failed - This refill cannot be delegated      Failed - Urine Drug Screen completed in last 360 days.      Passed - Valid encounter within last 6 months   Recent Outpatient Visits           3 months ago Annual physical exam   [facility] Glean Hess, MD   9 months ago Essential hypertension   [facility] Glean Hess, MD   1 year ago Annual physical exam   [facility] Glean Hess, MD   1 year ago Essential hypertension   [facility] Glean Hess, MD   2 years ago Chronic otitis media, unspecified otitis media type   [facility] Glean Hess, MD       Future Appointments             In 9 months Army Melia Jesse Sans, MD [facility], [facility]

## 2019-11-03 ENCOUNTER — Other Ambulatory Visit: Payer: Self-pay

## 2019-11-03 ENCOUNTER — Encounter: Payer: Self-pay | Admitting: Gastroenterology

## 2019-11-09 ENCOUNTER — Other Ambulatory Visit
Admission: RE | Admit: 2019-11-09 | Discharge: 2019-11-09 | Disposition: A | Discharge status: Home / Self Care | Payer: Medicare Other | Source: Ambulatory Visit | Attending: Gastroenterology | Admitting: Gastroenterology

## 2019-11-09 ENCOUNTER — Other Ambulatory Visit: Payer: Self-pay

## 2019-11-09 DIAGNOSIS — Z20822 Contact with and (suspected) exposure to covid-19: Secondary | ICD-10-CM | POA: Diagnosis not present

## 2019-11-09 DIAGNOSIS — Z01812 Encounter for preprocedural laboratory examination: Secondary | ICD-10-CM | POA: Insufficient documentation

## 2019-11-09 LAB — SARS CORONAVIRUS 2 (TAT 6-24 HRS): SARS Coronavirus 2: NEGATIVE

## 2019-11-10 NOTE — Discharge Instructions (Signed)
General Anesthesia, Adult, Care After This sheet gives you information about how to care for yourself after your procedure. Your health care provider may also give you more specific instructions. If you have problems or questions, contact your health care provider. What can I expect after the procedure? After the procedure, the following side effects are common:  Pain or discomfort at the IV site.  Nausea.  Vomiting.  Sore throat.  Trouble concentrating.  Feeling cold or chills.  Weak or tired.  Sleepiness and fatigue.  Soreness and body aches. These side effects can affect parts of the body that were not involved in surgery. Follow these instructions at home:  For at least 24 hours after the procedure:  Have a responsible adult stay with you. It is important to have someone help care for you until you are awake and alert.  Rest as needed.  Do not: ? Participate in activities in which you could fall or become injured. ? Drive. ? Use heavy machinery. ? Drink alcohol. ? Take sleeping pills or medicines that cause drowsiness. ? Make important decisions or sign legal documents. ? Take care of children on your own. Eating and drinking  Follow any instructions from your health care provider about eating or drinking restrictions.  When you feel hungry, start by eating small amounts of foods that are soft and easy to digest (bland), such as toast. Gradually return to your regular diet.  Drink enough fluid to keep your urine pale yellow.  If you vomit, rehydrate by drinking water, juice, or clear broth. General instructions  If you have sleep apnea, surgery and certain medicines can increase your risk for breathing problems. Follow instructions from your health care provider about wearing your sleep device: ? Anytime you are sleeping, including during daytime naps. ? While taking prescription pain medicines, sleeping medicines, or medicines that make you drowsy.  Return to  your normal activities as told by your health care provider. Ask your health care provider what activities are safe for you.  Take over-the-counter and prescription medicines only as told by your health care provider.  If you smoke, do not smoke without supervision.  Keep all follow-up visits as told by your health care provider. This is important. Contact a health care provider if:  You have nausea or vomiting that does not get better with medicine.  You cannot eat or drink without vomiting.  You have pain that does not get better with medicine.  You are unable to pass urine.  You develop a skin rash.  You have a fever.  You have redness around your IV site that gets worse. Get help right away if:  You have difficulty breathing.  You have chest pain.  You have blood in your urine or stool, or you vomit blood. Summary  After the procedure, it is common to have a sore throat or nausea. It is also common to feel tired.  Have a responsible adult stay with you for the first 24 hours after general anesthesia. It is important to have someone help care for you until you are awake and alert.  When you feel hungry, start by eating small amounts of foods that are soft and easy to digest (bland), such as toast. Gradually return to your regular diet.  Drink enough fluid to keep your urine pale yellow.  Return to your normal activities as told by your health care provider. Ask your health care provider what activities are safe for you. This information is not   intended to replace advice given to you by your health care provider. Make sure you discuss any questions you have with your health care provider. Document Revised: 05/16/2017 Document Reviewed: 12/27/2016 Elsevier Patient Education  2020 Elsevier Inc.  

## 2019-11-11 ENCOUNTER — Ambulatory Visit: Payer: Medicare Other | Admitting: Anesthesiology

## 2019-11-11 ENCOUNTER — Other Ambulatory Visit: Payer: Self-pay

## 2019-11-11 ENCOUNTER — Ambulatory Visit
Admission: RE | Admit: 2019-11-11 | Discharge: 2019-11-11 | Disposition: A | Discharge status: Home / Self Care | Payer: Medicare Other | Attending: Gastroenterology | Admitting: Gastroenterology

## 2019-11-11 ENCOUNTER — Encounter
Admission: RE | Disposition: A | Discharge status: Home / Self Care | Payer: Self-pay | Source: Home / Self Care | Attending: Gastroenterology

## 2019-11-11 DIAGNOSIS — K219 Gastro-esophageal reflux disease without esophagitis: Secondary | ICD-10-CM | POA: Diagnosis not present

## 2019-11-11 DIAGNOSIS — Z96651 Presence of right artificial knee joint: Secondary | ICD-10-CM | POA: Diagnosis not present

## 2019-11-11 DIAGNOSIS — E785 Hyperlipidemia, unspecified: Secondary | ICD-10-CM | POA: Insufficient documentation

## 2019-11-11 DIAGNOSIS — Z09 Encounter for follow-up examination after completed treatment for conditions other than malignant neoplasm: Secondary | ICD-10-CM | POA: Insufficient documentation

## 2019-11-11 DIAGNOSIS — E039 Hypothyroidism, unspecified: Secondary | ICD-10-CM | POA: Insufficient documentation

## 2019-11-11 DIAGNOSIS — M797 Fibromyalgia: Secondary | ICD-10-CM | POA: Diagnosis not present

## 2019-11-11 DIAGNOSIS — K635 Polyp of colon: Secondary | ICD-10-CM | POA: Diagnosis not present

## 2019-11-11 DIAGNOSIS — D122 Benign neoplasm of ascending colon: Secondary | ICD-10-CM | POA: Diagnosis not present

## 2019-11-11 DIAGNOSIS — K2970 Gastritis, unspecified, without bleeding: Secondary | ICD-10-CM | POA: Insufficient documentation

## 2019-11-11 DIAGNOSIS — M1990 Unspecified osteoarthritis, unspecified site: Secondary | ICD-10-CM | POA: Insufficient documentation

## 2019-11-11 DIAGNOSIS — K5730 Diverticulosis of large intestine without perforation or abscess without bleeding: Secondary | ICD-10-CM | POA: Diagnosis not present

## 2019-11-11 DIAGNOSIS — I10 Essential (primary) hypertension: Secondary | ICD-10-CM | POA: Insufficient documentation

## 2019-11-11 DIAGNOSIS — Z885 Allergy status to narcotic agent status: Secondary | ICD-10-CM | POA: Insufficient documentation

## 2019-11-11 DIAGNOSIS — K2100 Gastro-esophageal reflux disease with esophagitis, without bleeding: Secondary | ICD-10-CM | POA: Insufficient documentation

## 2019-11-11 DIAGNOSIS — Z79890 Hormone replacement therapy (postmenopausal): Secondary | ICD-10-CM | POA: Diagnosis not present

## 2019-11-11 DIAGNOSIS — Z7982 Long term (current) use of aspirin: Secondary | ICD-10-CM | POA: Diagnosis not present

## 2019-11-11 DIAGNOSIS — Z86010 Personal history of colonic polyps: Secondary | ICD-10-CM | POA: Insufficient documentation

## 2019-11-11 DIAGNOSIS — K2950 Unspecified chronic gastritis without bleeding: Secondary | ICD-10-CM | POA: Diagnosis not present

## 2019-11-11 DIAGNOSIS — I341 Nonrheumatic mitral (valve) prolapse: Secondary | ICD-10-CM | POA: Diagnosis not present

## 2019-11-11 DIAGNOSIS — M3500 Sicca syndrome, unspecified: Secondary | ICD-10-CM | POA: Diagnosis not present

## 2019-11-11 DIAGNOSIS — Z79899 Other long term (current) drug therapy: Secondary | ICD-10-CM | POA: Diagnosis not present

## 2019-11-11 DIAGNOSIS — Z888 Allergy status to other drugs, medicaments and biological substances status: Secondary | ICD-10-CM | POA: Diagnosis not present

## 2019-11-11 HISTORY — PX: COLONOSCOPY WITH PROPOFOL: SHX5780

## 2019-11-11 HISTORY — PX: ESOPHAGOGASTRODUODENOSCOPY (EGD) WITH PROPOFOL: SHX5813

## 2019-11-11 SURGERY — COLONOSCOPY WITH PROPOFOL
Anesthesia: General

## 2019-11-11 MED ORDER — SODIUM CHLORIDE 0.9 % IV SOLN: INTRAVENOUS | Status: DC

## 2019-11-11 MED ORDER — LACTATED RINGERS IV SOLN: INTRAVENOUS | Status: DC

## 2019-11-11 MED ORDER — PROPOFOL 10 MG/ML IV BOLUS
INTRAVENOUS | Status: DC | PRN
  Administered 2019-11-11: 30 mg via INTRAVENOUS
  Administered 2019-11-11 (×4): 20 mg via INTRAVENOUS
  Administered 2019-11-11: 100 mg via INTRAVENOUS
  Administered 2019-11-11: 30 mg via INTRAVENOUS
  Administered 2019-11-11 (×3): 20 mg via INTRAVENOUS
  Administered 2019-11-11: 30 mg via INTRAVENOUS
  Administered 2019-11-11 (×9): 20 mg via INTRAVENOUS
  Administered 2019-11-11: 30 mg via INTRAVENOUS
  Administered 2019-11-11: 20 mg via INTRAVENOUS

## 2019-11-11 MED ORDER — GLYCOPYRROLATE 0.2 MG/ML IJ SOLN
INTRAMUSCULAR | Status: DC | PRN
  Administered 2019-11-11 (×2): .1 mg via INTRAVENOUS

## 2019-11-11 MED ORDER — STERILE WATER FOR IRRIGATION IR SOLN: Status: DC | PRN

## 2019-11-11 MED ORDER — LIDOCAINE HCL (CARDIAC) PF 100 MG/5ML IV SOSY
PREFILLED_SYRINGE | INTRAVENOUS | Status: DC | PRN
  Administered 2019-11-11: 40 mg via INTRAVENOUS

## 2019-11-11 SURGICAL SUPPLY — 38 items
BALLN DILATOR 10-12 8 (BALLOONS)
BALLN DILATOR 12-15 8 (BALLOONS)
BALLN DILATOR 15-18 8 (BALLOONS)
BALLN DILATOR CRE 0-12 8 (BALLOONS)
BALLN DILATOR ESOPH 8 10 CRE (MISCELLANEOUS) IMPLANT
BALLOON DILATOR 12-15 8 (BALLOONS) IMPLANT
BALLOON DILATOR 15-18 8 (BALLOONS) IMPLANT
BALLOON DILATOR CRE 0-12 8 (BALLOONS) IMPLANT
BLOCK BITE 60FR ADLT L/F GRN (MISCELLANEOUS) ×2 IMPLANT
CLIP HMST 235XBRD CATH ROT (MISCELLANEOUS) ×2 IMPLANT
CLIP RESOLUTION 360 11X235 (MISCELLANEOUS) ×2
ELECT REM PT RETURN 9FT ADLT (ELECTROSURGICAL)
ELECTRODE REM PT RTRN 9FT ADLT (ELECTROSURGICAL) IMPLANT
ELEVIEW  INJECTABLE COMP 10 (MISCELLANEOUS) ×1
FCP ESCP3.2XJMB 240X2.8X (MISCELLANEOUS)
FORCEPS BIOP RAD 4 LRG CAP 4 (CUTTING FORCEPS) ×2 IMPLANT
FORCEPS BIOP RJ4 240 W/NDL (MISCELLANEOUS)
FORCEPS ESCP3.2XJMB 240X2.8X (MISCELLANEOUS) IMPLANT
GOWN CVR UNV OPN BCK APRN NK (MISCELLANEOUS) ×2 IMPLANT
GOWN ISOL THUMB LOOP REG UNIV (MISCELLANEOUS) ×2
INJECTABLE ELEVIEW COMP 10 (MISCELLANEOUS) ×1 IMPLANT
INJECTOR VARIJECT VIN23 (MISCELLANEOUS) ×1 IMPLANT
KIT DEFENDO VALVE AND CONN (KITS) IMPLANT
KIT ENDO PROCEDURE OLY (KITS) ×2 IMPLANT
MANIFOLD NEPTUNE II (INSTRUMENTS) ×2 IMPLANT
MARKER SPOT ENDO TATTOO 5ML (MISCELLANEOUS) IMPLANT
PROBE APC STR FIRE (PROBE) IMPLANT
RETRIEVER NET PLAT FOOD (MISCELLANEOUS) IMPLANT
RETRIEVER NET ROTH 2.5X230 LF (MISCELLANEOUS) ×2 IMPLANT
SNARE SHORT THROW 13M SML OVAL (MISCELLANEOUS) ×2 IMPLANT
SNARE SHORT THROW 30M LRG OVAL (MISCELLANEOUS) IMPLANT
SNARE SNG USE RND 15MM (INSTRUMENTS) ×2 IMPLANT
SPOT EX ENDOSCOPIC TATTOO (MISCELLANEOUS)
SYR INFLATION 60ML (SYRINGE) IMPLANT
TRAP ETRAP POLY (MISCELLANEOUS) ×2 IMPLANT
VARIJECT INJECTOR VIN23 (MISCELLANEOUS) ×2
WATER STERILE IRR 250ML POUR (IV SOLUTION) ×2 IMPLANT
WIRE CRE 18-20MM 8CM F G (MISCELLANEOUS) IMPLANT

## 2019-11-11 NOTE — Transfer of Care (Signed)
Immediate Anesthesia Transfer of Care Note  Patient: Ashley Glover  Procedure(s) Performed: COLONOSCOPY WITH PROPOFOL (N/A ) ESOPHAGOGASTRODUODENOSCOPY (EGD) WITH PROPOFOL (N/A )  Patient Location: PACU  Anesthesia Type: General  Level of Consciousness: awake, alert  and patient cooperative  Airway and Oxygen Therapy: Patient Spontanous Breathing and Patient connected to supplemental oxygen  Post-op Assessment: Post-op Vital signs reviewed, Patient's Cardiovascular Status Stable, Respiratory Function Stable, Patent Airway and No signs of Nausea or vomiting  Post-op Vital Signs: Reviewed and stable  Complications: No complications documented.

## 2019-11-11 NOTE — H&P (Signed)
[name], MD [address] [place], [place] [address] [phone] Fax : [phone]  Primary Care Physician:  Glean Hess, MD Primary Gastroenterologist:  Dr. Allen Norris  Pre-Procedure History & Physical: HPI:  Ashley Glover is a 59 y.o. female is here for an endoscopy and colonoscopy.   Past Medical History:  Diagnosis Date  . Allergic rhinitis   . Degenerative joint disease   . Fibromyalgia   . GERD (gastroesophageal reflux disease)   . Hyperlipidemia   . Hypertension   . Hypothyroid   . Mitral valve prolapse    states it is mild and does not see a cardiologist  . Redness of both eyes 09/30/2017  . Sjogrens syndrome [facility])     Past Surgical History:  Procedure Laterality Date  . AUGMENTATION MAMMAPLASTY Bilateral 1987  . COLONOSCOPY  05/2012   polyps  . COLONOSCOPY WITH PROPOFOL N/A 06/20/2016   Procedure: COLONOSCOPY WITH PROPOFOL;  Surgeon: [name], MD;  Location: [facility];  Service: Endoscopy;  Laterality: N/A;  . ESOPHAGOGASTRODUODENOSCOPY (EGD) WITH PROPOFOL N/A 06/20/2016   Procedure: ESOPHAGOGASTRODUODENOSCOPY (EGD) WITH PROPOFOL;  Surgeon: [name], MD;  Location: [facility];  Service: Endoscopy;  Laterality: N/A;  . FOOT SURGERY Right   . GANGLION CYST EXCISION Left   . NASAL SINUS SURGERY    . NEUROMA SURGERY Left   . PARTIAL HYSTERECTOMY  1996   +HPV and cervical dysplasia  . PLACEMENT OF BREAST IMPLANTS    . POLYPECTOMY  06/20/2016   Procedure: POLYPECTOMY;  Surgeon: [name], MD;  Location: [facility];  Service: Endoscopy;;  . REPLACEMENT TOTAL KNEE Right 06/2014  . TONSILLECTOMY      Prior to Admission medications   Medication Sig Start Date End Date Taking? Authorizing Provider  albuterol (VENTOLIN HFA) 108 (90 Base) MCG/ACT inhaler Inhale 2 puffs into the lungs every 6 (six) hours as needed for wheezing or shortness of breath. 12/22/18  Yes Glean Hess, MD  aspirin (ASPIRIN 81) 81  MG EC tablet Take 81 mg by mouth daily. Swallow whole.   Yes [provider]  butalbital-acetaminophen-caffeine (FIORICET WITH CODEINE) 50-325-40-30 MG capsule TAKE 1 CAPSULE BY MOUTH EVERY 6 (SIX) HOURS AS NEEDED FOR HEADACHE. 10/15/19  Yes Glean Hess, MD  calcium-vitamin D (OSCAL WITH D) 250-125 MG-UNIT tablet Take 1 tablet by mouth daily.   Yes [provider]  DULoxetine (CYMBALTA) 60 MG capsule TAKE 1 CAPSULE BY MOUTH  DAILY 02/04/19  Yes Glean Hess, MD  furosemide (LASIX) 40 MG tablet Take 1 tablet (40 mg total) by mouth daily. 05/12/17  Yes Glean Hess, MD  gabapentin (NEURONTIN) 600 MG tablet TAKE 1 TABLET BY MOUTH 3  TIMES DAILY 04/22/19  Yes Glean Hess, MD  hydrochlorothiazide (HYDRODIURIL) 25 MG tablet Take 12.5 mg by mouth daily.   Yes [provider]  hydrocortisone 2.5 % cream APPLY TOPICALLY TWO TIMES   DAILY 07/20/19  Yes Glean Hess, MD  levothyroxine (SYNTHROID) 88 MCG tablet TAKE 1 TABLET BY MOUTH  DAILY BEFORE BREAKFAST 04/22/19  Yes Glean Hess, MD  loratadine (CLARITIN) 10 MG tablet Take 10 mg by mouth daily.   Yes [provider]  lovastatin (MEVACOR) 40 MG tablet TAKE 1 TABLET BY MOUTH AT  BEDTIME 04/22/19  Yes Glean Hess, MD  montelukast (SINGULAIR) 10 MG tablet TAKE 1 TABLET BY MOUTH AT  BEDTIME 04/22/19  Yes Glean Hess, MD  Multiple Vitamin (  MULTIVITAMIN ADULT PO) Take 1 tablet by mouth daily.   Yes [provider]  Omega-3 Fatty Acids (FISH OIL) 1000 MG CAPS Take by mouth.   Yes [provider]  pantoprazole (PROTONIX) 40 MG tablet Take 1 tablet (40 mg total) by mouth daily. 09/29/19  Yes [name], MD  promethazine (PHENERGAN) 25 MG tablet Take 1 tablet (25 mg total) by mouth every 8 (eight) hours as needed for nausea or vomiting. 12/22/18  Yes Glean Hess, MD  propranolol (INDERAL) 40 MG tablet TAKE 1 TABLET BY MOUTH  TWICE DAILY 07/20/19  Yes Glean Hess,  MD  tiZANidine (ZANAFLEX) 4 MG tablet TAKE 1 TABLET BY MOUTH 3  TIMES DAILY 05/04/19  Yes Glean Hess, MD  zolpidem (AMBIEN CR) 6.25 MG CR tablet Take 1 tablet (6.25 mg total) by mouth at bedtime as needed for sleep. 06/28/19  Yes Glean Hess, MD  omeprazole (PRILOSEC) 40 MG capsule TAKE 1 CAPSULE BY MOUTH  DAILY Patient not taking: Reported on 11/03/2019 04/22/19   Glean Hess, MD  tobramycin-dexamethasone [facility]) ophthalmic solution Place 1 drop into both eyes every 6 (six) hours. Patient not taking: Reported on 09/27/2019 12/22/18   Glean Hess, MD    Allergies as of 09/27/2019 - Review Complete 09/27/2019  Allergen Reaction Noted  . Ace inhibitors Cough 11/21/2017  . Morphine and related Nausea Only 08/02/2015  . Savella [milnacipran hcl]  08/02/2015    Family History  Problem Relation Age of Onset  . Pulmonary embolism Mother   . Hypertension Mother   . AVM Father   . Hypertension Father   . Hypothyroidism Father   . Breast cancer Neg Hx     Social History   Socioeconomic History  . Marital status: Divorced    Spouse name: Not on file  . Number of children: 1  . Years of education: Not on file  . Highest education level: Not on file  Occupational History  . Occupation: disabled  Tobacco Use  . Smoking status: Never Smoker  . Smokeless tobacco: Never Used  Vaping Use  . Vaping Use: Never used  Substance and Sexual Activity  . Alcohol use: No    Alcohol/week: 0.0 standard drinks  . Drug use: No  . Sexual activity: Not on file  Other Topics Concern  . Not on file  Social History Narrative   Patient is on Soc Sec Disability.  She has been disabled for a number of years from Degenerative arthritis and Fibromyalgia.   Social Determinants of Health   Financial Resource Strain: Low Risk   . Difficulty of Paying Living Expenses: Not hard at all  Food Insecurity: No Food Insecurity  . Worried About Charity fundraiser in the Last Year: Never true    . Ran Out of Food in the Last Year: Never true  Transportation Needs: No Transportation Needs  . Lack of Transportation (Medical): No  . Lack of Transportation (Non-Medical): No  Physical Activity: Insufficiently Active  . Days of Exercise per Week: 3 days  . Minutes of Exercise per Session: 30 min  Stress: No Stress Concern Present  . Feeling of Stress : Not at all  Social Connections: Unknown  . Frequency of Communication with Friends and Family: Patient refused  . Frequency of Social Gatherings with Friends and Family: Patient refused  . Attends Religious Services: Patient refused  . Active Member of Clubs or Organizations: Patient refused  . Attends Archivist Meetings:  Patient refused  . Marital Status: Divorced  Human resources officer Violence: Not At Risk  . Fear of Current or Ex-Partner: No  . Emotionally Abused: No  . Physically Abused: No  . Sexually Abused: No    Review of Systems: See HPI, otherwise negative ROS  Physical Exam: Ht 5\' 5"  (1.651 m)   Wt 91.6 kg   BMI 33.61 kg/m  General:   Alert,  pleasant and cooperative in NAD Head:  Normocephalic and atraumatic. Neck:  Supple; no masses or thyromegaly. Lungs:  Clear throughout to auscultation.    Heart:  Regular rate and rhythm. Abdomen:  Soft, nontender and nondistended. Normal bowel sounds, without guarding, and without rebound.   Neurologic:  Alert and  oriented x4;  grossly normal neurologically.  Impression/Plan: Ashley Glover is here for an endoscopy and colonoscopy to be performed for GERD gerd and history of adenomatous colon polyps in 05/2016  Risks, benefits, limitations, and alternatives regarding  endoscopy and colonoscopy have been reviewed with the patient.  Questions have been answered.  All parties agreeable.   [name], MD  11/11/2019, 8:48 AM

## 2019-11-11 NOTE — Anesthesia Preprocedure Evaluation (Signed)
Anesthesia Evaluation  Patient identified by MRN, date of birth, ID band Patient awake    Reviewed: Allergy & Precautions, H&P , NPO status , Patient's Chart, lab work & pertinent test results, reviewed documented beta blocker date and time   Airway Mallampati: II  TM Distance: >3 FB Neck ROM: full    Dental no notable dental hx.    Pulmonary neg pulmonary ROS,    Pulmonary exam normal breath sounds clear to auscultation       Cardiovascular Exercise Tolerance: Good hypertension, + Valvular Problems/Murmurs MVP  Rhythm:regular Rate:Normal     Neuro/Psych  Headaches, fibromyalgia negative psych ROS   GI/Hepatic Neg liver ROS, PUD, GERD  ,  Endo/Other  Hypothyroidism   Renal/GU negative Renal ROS  negative genitourinary   Musculoskeletal   Abdominal   Peds  Hematology negative hematology ROS (+)   Anesthesia Other Findings   Reproductive/Obstetrics negative OB ROS                             Anesthesia Physical Anesthesia Plan  ASA: II  Anesthesia Plan: General   Post-op Pain Management:    Induction:   PONV Risk Score and Plan: 3 and TIVA, Propofol infusion and Treatment may vary due to age or medical condition  Airway Management Planned:   Additional Equipment:   Intra-op Plan:   Post-operative Plan:   Informed Consent: I have reviewed the patients History and Physical, chart, labs and discussed the procedure including the risks, benefits and alternatives for the proposed anesthesia with the patient or authorized representative who has indicated his/her understanding and acceptance.     Dental Advisory Given  Plan Discussed with: CRNA  Anesthesia Plan Comments:         Anesthesia Quick Evaluation

## 2019-11-11 NOTE — Anesthesia Postprocedure Evaluation (Signed)
Anesthesia Post Note  Patient: Ashley Glover  Procedure(s) Performed: COLONOSCOPY WITH PROPOFOL (N/A ) ESOPHAGOGASTRODUODENOSCOPY (EGD) WITH PROPOFOL (N/A )     Patient location during evaluation: PACU Anesthesia Type: General Level of consciousness: awake and alert Pain management: pain level controlled Vital Signs Assessment: post-procedure vital signs reviewed and stable Respiratory status: spontaneous breathing, nonlabored ventilation, respiratory function stable and patient connected to nasal cannula oxygen Cardiovascular status: blood pressure returned to baseline and stable Postop Assessment: no apparent nausea or vomiting Anesthetic complications: no   No complications documented.  Alisa Graff

## 2019-11-11 NOTE — Anesthesia Procedure Notes (Signed)
Procedure Name: MAC Date/Time: 11/11/2019 9:27 AM Performed by: Vanetta Shawl, CRNA Pre-anesthesia Checklist: Patient identified, Emergency Drugs available, Suction available, Timeout performed and Patient being monitored Patient Re-evaluated:Patient Re-evaluated prior to induction Oxygen Delivery Method: Nasal cannula Placement Confirmation: positive ETCO2

## 2019-11-11 NOTE — Op Note (Addendum)
[facility] Gastroenterology Patient Name: Ashley Glover Procedure Date: 11/11/2019 9:25 AM MRN: [id number] Account #: [id number] Date of Birth: Oct 08, 1960 Admit Type: Outpatient Age: 59 Room: [facility] OR ROOM 01 Gender: Female Note Status: Finalized Procedure:             Colonoscopy Indications:           High risk colon cancer surveillance: Personal history                         of colonic polyps Providers:             [name] MD, MD Medicines:             Propofol per Anesthesia Complications:         No immediate complications. Procedure:             Pre-Anesthesia Assessment:                        - Prior to the procedure, a History and Physical was                         performed, and patient medications and allergies were                         reviewed. The patient's tolerance of previous                         anesthesia was also reviewed. The risks and benefits                         of the procedure and the sedation options and risks                         were discussed with the patient. All questions were                         answered, and informed consent was obtained. Prior                         Anticoagulants: The patient has taken no previous                         anticoagulant or antiplatelet agents. ASA Grade                         Assessment: II - A patient with mild systemic disease.                         After reviewing the risks and benefits, the patient                         was deemed in satisfactory condition to undergo the                         procedure.                        After obtaining informed consent, the colonoscope was  passed under direct vision. Throughout the procedure,                         the patient's blood pressure, pulse, and oxygen                         saturations were monitored continuously. The                         Colonoscope was introduced through the anus  and                         advanced to the the cecum, identified by appendiceal                         orifice and ileocecal valve. The colonoscopy was                         performed without difficulty. The patient tolerated                         the procedure well. The quality of the bowel                         preparation was excellent. Findings:      The perianal and digital rectal examinations were normal.      A 20 mm polyp was found in the ascending colon. The polyp was sessile.       Area was successfully injected with 5 mL saline with indigo carmine for       a lift polypectomy. To prevent bleeding post-intervention, three       hemostatic clips were successfully placed (MR conditional). There was no       bleeding at the end of the procedure. Preparations were made for mucosal       resection. Chromoscopy with indigo carmine was done to mark the borders       of the lesion. Saline with indigo carmine was injected to raise the       lesion. Piecemeal mucosal resection using a snare was performed.       Resection and retrieval were complete. To prevent bleeding       post-intervention, three hemostatic clips were successfully placed (MR       conditional). There was no bleeding at the end of the procedure.      Multiple small-mouthed diverticula were found in the sigmoid colon. Impression:            - One 20 mm polyp in the ascending colon, removed with                         mucosal resection. Resected and retrieved. Injected.                         Clips (MR conditional) were placed.                        - Diverticulosis in the sigmoid colon.                        - Mucosal resection was performed. Resection and  retrieval were complete. Recommendation:        - Discharge patient to home.                        - Resume previous diet.                        - Continue present medications.                        - Await pathology results.                         - Repeat colonoscopy in 1 year for surveillance. Procedure Code(s):     --- Professional ---                        [phone], Colonoscopy, flexible; with endoscopic mucosal                         resection Diagnosis Code(s):     --- Professional ---                        Z86.010, Personal history of colonic polyps                        K63.5, Polyp of colon CPT copyright 2019 American Medical Association. All rights reserved. The codes documented in this report are preliminary and upon coder review may  be revised to meet current compliance requirements. [name] MD, MD 11/11/2019 10:17:23 AM This report has been signed electronically. Number of Addenda: 0 Note Initiated On: 11/11/2019 9:25 AM Scope Withdrawal Time: 0 hours 33 minutes 53 seconds  Total Procedure Duration: 0 hours 37 minutes 45 seconds  Estimated Blood Loss:  Estimated blood loss: none. Estimated blood loss: none.      [facility]

## 2019-11-11 NOTE — Op Note (Signed)
[facility] Gastroenterology Patient Name: Ashley Glover Procedure Date: 11/11/2019 9:25 AM MRN: [id number] Account #: [id number] Date of Birth: 1960-09-15 Admit Type: Outpatient Age: 59 Room: [facility] OR ROOM 01 Gender: Female Note Status: Finalized Procedure:             Upper GI endoscopy Indications:           Heartburn Providers:             [name] MD, MD Referring MD:          Halina Maidens, MD (Referring MD) Medicines:             Propofol per Anesthesia Complications:         No immediate complications. Procedure:             Pre-Anesthesia Assessment:                        - Prior to the procedure, a History and Physical was                         performed, and patient medications and allergies were                         reviewed. The patient's tolerance of previous                         anesthesia was also reviewed. The risks and benefits                         of the procedure and the sedation options and risks                         were discussed with the patient. All questions were                         answered, and informed consent was obtained. Prior                         Anticoagulants: The patient has taken no previous                         anticoagulant or antiplatelet agents. ASA Grade                         Assessment: II - A patient with mild systemic disease.                         After reviewing the risks and benefits, the patient                         was deemed in satisfactory condition to undergo the                         procedure.                        After obtaining informed consent, the endoscope was  passed under direct vision. Throughout the procedure,                         the patient's blood pressure, pulse, and oxygen                         saturations were monitored continuously. The was                         introduced through the mouth, and advanced to the                          second part of duodenum. The upper GI endoscopy was                         accomplished without difficulty. The patient tolerated                         the procedure well. Findings:      LA Grade C (one or more mucosal breaks continuous between tops of 2 or       more mucosal folds, less than 75% circumference) esophagitis with no       bleeding was found in the lower third of the esophagus.      Localized moderate inflammation characterized by erosions was found in       the gastric antrum. Biopsies were taken with a cold forceps for       histology.      The examined duodenum was normal. Impression:            - LA Grade C reflux esophagitis with no bleeding.                        - Gastritis. Biopsied.                        - Normal examined duodenum. Recommendation:        - Discharge patient to home.                        - Resume previous diet.                        - Continue present medications.                        - Await pathology results.                        - Perform a colonoscopy today. Procedure Code(s):     --- Professional ---                        [phone], Esophagogastroduodenoscopy, flexible,                         transoral; with biopsy, single or multiple Diagnosis Code(s):     --- Professional ---                        R12, Heartburn  K29.70, Gastritis, unspecified, without bleeding                        K21.00, Gastro-esophageal reflux disease with                         esophagitis, without bleeding CPT copyright 2019 American Medical Association. All rights reserved. The codes documented in this report are preliminary and upon coder review may  be revised to meet current compliance requirements. [name] MD, MD 11/11/2019 9:35:53 AM This report has been signed electronically. Number of Addenda: 0 Note Initiated On: 11/11/2019 9:25 AM Total Procedure Duration: 0 hours 3 minutes 36 seconds  Estimated Blood Loss:   Estimated blood loss: none.      [facility]

## 2019-11-12 ENCOUNTER — Encounter: Payer: Self-pay | Admitting: Gastroenterology

## 2019-11-12 LAB — SURGICAL PATHOLOGY

## 2019-11-18 ENCOUNTER — Telehealth: Payer: Self-pay

## 2019-11-18 NOTE — Telephone Encounter (Signed)
-----   Message from [name], MD sent at 11/18/2019 12:18 PM EDT ----- Have the patient come in to discuss the findings and what further needs to be done about the pathology findings.

## 2019-11-18 NOTE — Telephone Encounter (Signed)
Patient has appointment 11/25/2019

## 2019-11-22 DIAGNOSIS — M67372 Transient synovitis, left ankle and foot: Secondary | ICD-10-CM | POA: Diagnosis not present

## 2019-11-22 DIAGNOSIS — M67371 Transient synovitis, right ankle and foot: Secondary | ICD-10-CM | POA: Diagnosis not present

## 2019-11-22 DIAGNOSIS — M170 Bilateral primary osteoarthritis of knee: Secondary | ICD-10-CM | POA: Diagnosis not present

## 2019-11-25 ENCOUNTER — Other Ambulatory Visit: Payer: Self-pay | Admitting: Internal Medicine

## 2019-11-25 ENCOUNTER — Ambulatory Visit (INDEPENDENT_AMBULATORY_CARE_PROVIDER_SITE_OTHER): Payer: Medicare Other | Admitting: Gastroenterology

## 2019-11-25 ENCOUNTER — Encounter: Payer: Self-pay | Admitting: Gastroenterology

## 2019-11-25 ENCOUNTER — Other Ambulatory Visit: Payer: Self-pay

## 2019-11-25 VITALS — BP 127/90 | HR 63 | Temp 96.9°F | Ht 65.0 in | Wt 197.2 lb

## 2019-11-25 DIAGNOSIS — K219 Gastro-esophageal reflux disease without esophagitis: Secondary | ICD-10-CM

## 2019-11-25 MED ORDER — PANTOPRAZOLE SODIUM 40 MG PO TBEC: 40.0000 mg | DELAYED_RELEASE_TABLET | Freq: Two times a day (BID) | ORAL | 0 refills | Status: DC

## 2019-11-25 NOTE — Progress Notes (Signed)
Primary Care Physician: Glean Hess, MD  Primary Gastroenterologist:  Dr. [name]  Chief Complaint  Patient presents with  . Follow up procedure results    HPI: Ashley Glover is a 59 y.o. female here for follow-up after having an EGD and colonoscopy.  EGD showed erosive esophagitis with gastritis.  The patient was presently taking 40 mg of Protonix.  The patient's Dexilant was not covered by the insurance plan.  The patient still has some acid breakthrough when she does not eat the right things.  The patient also had a large polyp in the ascending colon at the site of the previous polypectomy.  The lesion was large and closed with multiple clips.  The patient now comes for follow-up to discuss the results.  Past Medical History:  Diagnosis Date  . Allergic rhinitis   . Degenerative joint disease   . Fibromyalgia   . GERD (gastroesophageal reflux disease)   . Hyperlipidemia   . Hypertension   . Hypothyroid   . Mitral valve prolapse    states it is mild and does not see a cardiologist  . Redness of both eyes 09/30/2017  . Sjogrens syndrome ([place])     Current Outpatient Medications  Medication Sig Dispense Refill  . albuterol (VENTOLIN HFA) 108 (90 Base) MCG/ACT inhaler Inhale 2 puffs into the lungs every 6 (six) hours as needed for wheezing or shortness of breath. 8.5 g 5  . aspirin (ASPIRIN 81) 81 MG EC tablet Take 81 mg by mouth daily. Swallow whole.    . butalbital-acetaminophen-caffeine (FIORICET WITH CODEINE) 50-325-40-30 MG capsule TAKE 1 CAPSULE BY MOUTH EVERY 6 (SIX) HOURS AS NEEDED FOR HEADACHE. 60 capsule 0  . calcium-vitamin D (OSCAL WITH D) 250-125 MG-UNIT tablet Take 1 tablet by mouth daily.    . DULoxetine (CYMBALTA) 60 MG capsule TAKE 1 CAPSULE BY MOUTH  DAILY 90 capsule 0  . furosemide (LASIX) 40 MG tablet Take 1 tablet (40 mg total) by mouth daily. 90 tablet 3  . gabapentin (NEURONTIN) 600 MG tablet TAKE 1 TABLET BY MOUTH 3  TIMES DAILY 270 tablet 3  .  hydrochlorothiazide (HYDRODIURIL) 25 MG tablet Take 12.5 mg by mouth daily.    . hydrocortisone 2.5 % cream APPLY TOPICALLY TWO TIMES   DAILY 90 g 0  . levothyroxine (SYNTHROID) 88 MCG tablet TAKE 1 TABLET BY MOUTH  DAILY BEFORE BREAKFAST 90 tablet 3  . loratadine (CLARITIN) 10 MG tablet Take 10 mg by mouth daily.    [name] lovastatin (MEVACOR) 40 MG tablet TAKE 1 TABLET BY MOUTH AT  BEDTIME 90 tablet 3  . montelukast (SINGULAIR) 10 MG tablet TAKE 1 TABLET BY MOUTH AT  BEDTIME 90 tablet 3  . Multiple Vitamin (MULTIVITAMIN ADULT PO) Take 1 tablet by mouth daily.    . Omega-3 Fatty Acids (FISH OIL) 1000 MG CAPS Take by mouth.    [name] omeprazole (PRILOSEC) 40 MG capsule TAKE 1 CAPSULE BY MOUTH  DAILY 90 capsule 3  . pantoprazole (PROTONIX) 40 MG tablet Take 1 tablet (40 mg total) by mouth 2 (two) times daily. 180 tablet 0  . promethazine (PHENERGAN) 25 MG tablet Take 1 tablet (25 mg total) by mouth every 8 (eight) hours as needed for nausea or vomiting. 60 tablet 1  . propranolol (INDERAL) 40 MG tablet TAKE 1 TABLET BY MOUTH  TWICE DAILY 180 tablet 3  . tiZANidine (ZANAFLEX) 4 MG tablet TAKE 1 TABLET BY MOUTH 3  TIMES DAILY 270 tablet  3  . tobramycin-dexamethasone (TOBRADEX) ophthalmic solution Place 1 drop into both eyes every 6 (six) hours. 5 mL 5  . zolpidem (AMBIEN CR) 6.25 MG CR tablet Take 1 tablet (6.25 mg total) by mouth at bedtime as needed for sleep. 90 tablet 0   No current facility-administered medications for this visit.    Allergies as of 11/25/2019 - Review Complete 11/11/2019  Allergen Reaction Noted  . Ace inhibitors Cough 11/21/2017  . Morphine and related Nausea Only 08/02/2015  . Savella [milnacipran hcl] Palpitations 08/02/2015    ROS:  General: Negative for anorexia, weight loss, fever, chills, fatigue, weakness. ENT: Negative for hoarseness, difficulty swallowing , nasal congestion. CV: Negative for chest pain, angina, palpitations, dyspnea on exertion, peripheral edema.    Respiratory: Negative for dyspnea at rest, dyspnea on exertion, cough, sputum, wheezing.  GI: See history of present illness. GU:  Negative for dysuria, hematuria, urinary incontinence, urinary frequency, nocturnal urination.  Endo: Negative for unusual weight change.    Physical Examination:   BP 127/90   Pulse 63   Temp (!) 96.9 F (36.1 C) (Temporal)   Ht 5\' 5"  (1.651 m)   Wt 197 lb 3.2 oz (89.4 kg)   BMI 32.82 kg/m   General: Well-nourished, well-developed in no acute distress.  Eyes: No icterus. Conjunctivae pink. Neuro: Alert and oriented x 3.  Grossly intact. Skin: Warm and dry, no jaundice.   Psych: Alert and cooperative, normal mood and affect.  Labs:    Imaging Studies: No results found.  Assessment and Plan:   Ashley Glover is a 59 y.o. y/o female who comes in here today to discuss her results.  The patient has been given the results of her procedures and has been told that she should have a repeat colonoscopy in 3 months and upper endoscopy to see if the esophagitis is healed and if there is any residual polyp.  The patient will start taking her pantoprazole 40 mg twice a day due to her esophagitis on her endoscopy.  The patient has been explained the plan and agrees with it.     [name], MD. Marval Regal    Note: This dictation was prepared with Dragon dictation along with smaller phrase technology. Any transcriptional errors that result from this process are unintentional.

## 2019-12-06 ENCOUNTER — Other Ambulatory Visit: Payer: Self-pay | Admitting: Internal Medicine

## 2019-12-06 NOTE — Telephone Encounter (Signed)
Requested medication (s) are due for refill today- yes-if to continue  Requested medication (s) are on the active medication list -yes  Future visit scheduled -yes  Last refill: 09/24/19  Notes to clinic: Request for medication not assigned protocol  Requested Prescriptions  Pending Prescriptions Disp Refills   hydrocortisone 2.5 % cream [Pharmacy Med Name: HYDROCORTISONE  2.5%  CRE] 90 g 0    Sig: APPLY TOPICALLY TWO TIMES   DAILY      Off-Protocol Failed - 12/06/2019  9:56 AM      Failed - Medication not assigned to a protocol, review manually.      Passed - Valid encounter within last 12 months    Recent Outpatient Visits           5 months ago Annual physical exam   [facility] Glean Hess, MD   11 months ago Essential hypertension   [facility] Glean Hess, MD   1 year ago Annual physical exam   [facility] Glean Hess, MD   2 years ago Essential hypertension   [facility] Glean Hess, MD   2 years ago Chronic otitis media, unspecified otitis media type   [facility] Glean Hess, MD       Future Appointments             In 1 month Army Melia Jesse Sans, MD [facility], [place]   In 7 months Army Melia Jesse Sans, MD [facility], [facility]                Requested Prescriptions  Pending Prescriptions Disp Refills   hydrocortisone 2.5 % cream [Pharmacy Med Name: HYDROCORTISONE  2.5%  CRE] 90 g 0    Sig: APPLY TOPICALLY TWO TIMES   DAILY      Off-Protocol Failed - 12/06/2019  9:56 AM      Failed - Medication not assigned to a protocol, review manually.      Passed - Valid encounter within last 12 months    Recent Outpatient Visits           5 months ago Annual physical exam   [facility] Glean Hess, MD   11 months ago Essential hypertension   [facility] Glean Hess, MD   1 year ago Annual physical exam   [facility] Glean Hess, MD   2 years ago Essential hypertension   [facility] Glean Hess, MD   2 years ago Chronic otitis media, unspecified otitis media type   [facility] Glean Hess, MD       Future Appointments             In 1 month Army Melia Jesse Sans, MD [facility], [place]   In 7 months Army Melia, Jesse Sans, MD [facility], [facility]

## 2019-12-07 ENCOUNTER — Other Ambulatory Visit: Payer: Self-pay

## 2019-12-07 DIAGNOSIS — Z86010 Personal history of colonic polyps: Secondary | ICD-10-CM

## 2019-12-07 DIAGNOSIS — K209 Esophagitis, unspecified without bleeding: Secondary | ICD-10-CM

## 2020-01-25 ENCOUNTER — Ambulatory Visit (INDEPENDENT_AMBULATORY_CARE_PROVIDER_SITE_OTHER): Payer: Medicare Other | Admitting: Internal Medicine

## 2020-01-25 ENCOUNTER — Encounter: Payer: Self-pay | Admitting: Internal Medicine

## 2020-01-25 ENCOUNTER — Other Ambulatory Visit: Payer: Self-pay

## 2020-01-25 VITALS — BP 128/78 | HR 77 | Temp 97.8°F | Ht 65.0 in | Wt 193.0 lb

## 2020-01-25 DIAGNOSIS — G43C0 Periodic headache syndromes in child or adult, not intractable: Secondary | ICD-10-CM

## 2020-01-25 DIAGNOSIS — E782 Mixed hyperlipidemia: Secondary | ICD-10-CM | POA: Diagnosis not present

## 2020-01-25 DIAGNOSIS — K279 Peptic ulcer, site unspecified, unspecified as acute or chronic, without hemorrhage or perforation: Secondary | ICD-10-CM | POA: Diagnosis not present

## 2020-01-25 DIAGNOSIS — M3500 Sicca syndrome, unspecified: Secondary | ICD-10-CM

## 2020-01-25 DIAGNOSIS — L299 Pruritus, unspecified: Secondary | ICD-10-CM

## 2020-01-25 DIAGNOSIS — G4701 Insomnia due to medical condition: Secondary | ICD-10-CM

## 2020-01-25 NOTE — Patient Instructions (Signed)
Claritin can be taken every day - you can actually take it twice a day.  Use Benadryl at bedtime.

## 2020-01-25 NOTE — Progress Notes (Signed)
Date:  01/25/2020   Name:  Ashley Glover   DOB:  02/14/1961   MRN:  [id number]   Chief Complaint: Hyperlipidemia (Med refills and follow up on cholesterol labs. ), Gastroesophageal Reflux, Migraine, Insomnia, and Hypertension  Hyperlipidemia This is a chronic problem. The problem is uncontrolled. Associated symptoms include myalgias. Pertinent negatives include no chest pain. Current antihyperlipidemic treatment includes statins (changed to crestor recently).  Gastroesophageal Reflux She complains of coughing (much improved with bid PPI), globus sensation and heartburn. She reports no chest pain or no wheezing. This is a recurrent problem. The problem occurs rarely. Associated symptoms include fatigue. She has tried a PPI for the symptoms. The treatment provided significant relief. Past procedures include an EGD.  Migraine  This is a recurrent problem. The problem has been unchanged. The pain quality is similar to prior headaches. Associated symptoms include coughing (much improved with bid PPI) and insomnia. Pertinent negatives include no dizziness or fever. She has tried beta blockers (and fioricet prn) for the symptoms. Her past medical history is significant for hypertension.  Insomnia Primary symptoms: sleep disturbance, difficulty falling asleep, frequent awakening.  The problem occurs nightly. The symptoms are aggravated by pain. Past treatments include medication Lorrin Mais). The treatment provided significant relief.  Hypertension This is a chronic problem. The problem is controlled. Associated symptoms include headaches. Pertinent negatives include no chest pain or palpitations. Past treatments include diuretics (hctz and lasix).    Lab Results  Component Value Date   CREATININE 0.97 06/25/2019   BUN 13 06/25/2019   NA 137 06/25/2019   K 5.5 (H) 06/25/2019   CL 98 06/25/2019   CO2 25 06/25/2019   Lab Results  Component Value Date   CHOL 224 (H) 06/25/2019   HDL 56  06/25/2019   LDLCALC 118 (H) 06/25/2019   TRIG 288 (H) 06/25/2019   CHOLHDL 4.0 06/25/2019   Lab Results  Component Value Date   TSH 3.940 06/25/2019   Lab Results  Component Value Date   HGBA1C 5.7 (H) 05/26/2018   Lab Results  Component Value Date   WBC 8.4 06/25/2019   HGB 13.8 06/25/2019   HCT 42.0 06/25/2019   MCV 89 06/25/2019   PLT 250 06/25/2019   Lab Results  Component Value Date   ALT 18 06/25/2019   AST 24 06/25/2019   ALKPHOS 95 06/25/2019   BILITOT 0.4 06/25/2019     Review of Systems  Constitutional: Positive for fatigue. Negative for chills, fever and unexpected weight change.  HENT: Negative for trouble swallowing.   Respiratory: Positive for cough (much improved with bid PPI). Negative for chest tightness and wheezing.   Cardiovascular: Negative for chest pain, palpitations and leg swelling.  Gastrointestinal: Positive for heartburn.  Genitourinary: Negative for dysuria and hematuria.  Musculoskeletal: Positive for myalgias.  Skin: Negative for color change (but itching).  Neurological: Positive for headaches. Negative for dizziness.  Psychiatric/Behavioral: Positive for sleep disturbance. Negative for dysphoric mood. The patient has insomnia. The patient is not nervous/anxious.     Patient Active Problem List   Diagnosis Date Noted  . Gastroesophageal reflux disease with esophagitis without hemorrhage   . Personal history of colonic polyps   . Polyp of ascending colon   . Insomnia 06/25/2019  . Osteopenia determined by x-ray 06/23/2018  . Chronic otitis media 09/30/2017  . Migraine without aura and without status migrainosus, not intractable 05/21/2017  . Not currently working due to disabled status 01/24/2017  . Degenerative arthritis 01/24/2017  .  Frequent PVCs 08/20/2016  . Benign neoplasm of ascending colon   . Benign neoplasm of cecum   . Hx of colonic polyps 04/24/2016  . Periodic headache syndrome, not intractable 01/31/2016  .  Rosacea, acne 01/31/2016  . Essential hypertension 08/02/2015  . Hypothyroidism due to acquired atrophy of thyroid 08/02/2015  . Fibromyalgia 08/02/2015  . Sjogren's syndrome ([place]) 08/02/2015  . Peptic ulcer disease 08/02/2015  . Primary osteoarthritis of both knees 08/02/2015  . Osteopenia 08/02/2015  . Other allergic rhinitis 08/02/2015  . Hyperlipidemia, mixed 08/02/2015  . Hx of abnormal cervical Pap smear 08/02/2015    Allergies  Allergen Reactions  . Ace Inhibitors Cough  . Morphine And Related Nausea Only  . Savella [Milnacipran Hcl] Palpitations    Past Surgical History:  Procedure Laterality Date  . AUGMENTATION MAMMAPLASTY Bilateral 1987  . COLONOSCOPY  05/2012   polyps  . COLONOSCOPY WITH PROPOFOL N/A 06/20/2016   Procedure: COLONOSCOPY WITH PROPOFOL;  Surgeon: [name], MD;  Location: [place];  Service: Endoscopy;  Laterality: N/A;  . COLONOSCOPY WITH PROPOFOL N/A 11/11/2019   Procedure: COLONOSCOPY WITH PROPOFOL;  Surgeon: [name], MD;  Location: [place];  Service: Endoscopy;  Laterality: N/A;  3  . ESOPHAGOGASTRODUODENOSCOPY (EGD) WITH PROPOFOL N/A 06/20/2016   Procedure: ESOPHAGOGASTRODUODENOSCOPY (EGD) WITH PROPOFOL;  Surgeon: [name], MD;  Location: [place];  Service: Endoscopy;  Laterality: N/A;  . ESOPHAGOGASTRODUODENOSCOPY (EGD) WITH PROPOFOL N/A 11/11/2019   Procedure: ESOPHAGOGASTRODUODENOSCOPY (EGD) WITH PROPOFOL;  Surgeon: [name], MD;  Location: [place];  Service: Endoscopy;  Laterality: N/A;  . FOOT SURGERY Right   . GANGLION CYST EXCISION Left   . NASAL SINUS SURGERY    . NEUROMA SURGERY Left   . PARTIAL HYSTERECTOMY  1996   +HPV and cervical dysplasia  . PLACEMENT OF BREAST IMPLANTS    . POLYPECTOMY  06/20/2016   Procedure: POLYPECTOMY;  Surgeon: [name], MD;  Location: [place];  Service: Endoscopy;;  . REPLACEMENT TOTAL KNEE Right 06/2014  . TONSILLECTOMY      Social  History   Tobacco Use  . Smoking status: Never Smoker  . Smokeless tobacco: Never Used  Vaping Use  . Vaping Use: Never used  Substance Use Topics  . Alcohol use: No    Alcohol/week: 0.0 standard drinks  . Drug use: No     Medication list has been reviewed and updated.  Current Meds  Medication Sig  . albuterol (VENTOLIN HFA) 108 (90 Base) MCG/ACT inhaler Inhale 2 puffs into the lungs every 6 (six) hours as needed for wheezing or shortness of breath.  Marland Kitchen aspirin (ASPIRIN 81) 81 MG EC tablet Take 81 mg by mouth daily. Swallow whole.  . butalbital-acetaminophen-caffeine (FIORICET WITH CODEINE) 50-325-40-30 MG capsule TAKE 1 CAPSULE BY MOUTH EVERY 6 (SIX) HOURS AS NEEDED FOR HEADACHE.  . calcium-vitamin D (OSCAL WITH D) 250-125 MG-UNIT tablet Take 1 tablet by mouth daily.  . DULoxetine (CYMBALTA) 60 MG capsule TAKE 1 CAPSULE BY MOUTH  DAILY  . furosemide (LASIX) 40 MG tablet Take 1 tablet (40 mg total) by mouth daily.  Marland Kitchen gabapentin (NEURONTIN) 600 MG tablet TAKE 1 TABLET BY MOUTH 3  TIMES DAILY  . hydrochlorothiazide (HYDRODIURIL) 25 MG tablet Take 12.5 mg by mouth daily.  . hydrocortisone 2.5 % cream APPLY TOPICALLY TWO TIMES   DAILY  . levothyroxine (SYNTHROID) 88 MCG tablet TAKE 1 TABLET BY MOUTH  DAILY BEFORE BREAKFAST  . loratadine (CLARITIN) 10 MG tablet  Take 10 mg by mouth daily.  . montelukast (SINGULAIR) 10 MG tablet TAKE 1 TABLET BY MOUTH AT  BEDTIME  . Multiple Vitamin (MULTIVITAMIN ADULT PO) Take 1 tablet by mouth daily.  . Omega-3 Fatty Acids (FISH OIL) 1000 MG CAPS Take by mouth.  . pantoprazole (PROTONIX) 40 MG tablet Take 1 tablet (40 mg total) by mouth 2 (two) times daily.  . promethazine (PHENERGAN) 25 MG tablet Take 1 tablet (25 mg total) by mouth every 8 (eight) hours as needed for nausea or vomiting.  . propranolol (INDERAL) 40 MG tablet TAKE 1 TABLET BY MOUTH  TWICE DAILY  . rosuvastatin (CRESTOR) 20 MG tablet Take 20 mg by mouth at bedtime.  Marland Kitchen tiZANidine  (ZANAFLEX) 4 MG tablet TAKE 1 TABLET BY MOUTH 3  TIMES DAILY  . tobramycin-dexamethasone (TOBRADEX) ophthalmic solution Place 1 drop into both eyes every 6 (six) hours.  Marland Kitchen zolpidem (AMBIEN CR) 6.25 MG CR tablet Take 1 tablet (6.25 mg total) by mouth at bedtime as needed for sleep.    PHQ 2/9 Scores 06/21/2019 12/22/2018 05/26/2018 05/01/2017  PHQ - 2 Score 0 0 0 0  PHQ- 9 Score - 0 - -    No flowsheet data found.  BP Readings from Last 3 Encounters:  01/25/20 128/78  11/25/19 127/90  11/11/19 (!) 120/92    Physical Exam Vitals and nursing note reviewed.  Constitutional:      General: She is not in acute distress.    Appearance: She is well-developed.  HENT:     Head: Normocephalic and atraumatic.  Cardiovascular:     Rate and Rhythm: Normal rate and regular rhythm.     Pulses: Normal pulses.     Heart sounds: No murmur heard.   Pulmonary:     Effort: Pulmonary effort is normal. No respiratory distress.     Breath sounds: No wheezing or rhonchi.  Musculoskeletal:     Right lower leg: No edema.     Left lower leg: No edema.  Lymphadenopathy:     Cervical: No cervical adenopathy.  Skin:    General: Skin is warm and dry.     Findings: No erythema or rash.  Neurological:     Mental Status: She is alert and oriented to person, place, and time.  Psychiatric:        Behavior: Behavior normal.        Thought Content: Thought content normal.     Wt Readings from Last 3 Encounters:  01/25/20 193 lb (87.5 kg)  11/25/19 197 lb 3.2 oz (89.4 kg)  11/11/19 195 lb (88.5 kg)    BP 128/78   Pulse 77   Temp 97.8 F (36.6 C) (Oral)   Ht 5\' 5"  (1.651 m)   Wt 193 lb (87.5 kg)   SpO2 95%   BMI 32.12 kg/m   Assessment and Plan: 1. Hyperlipidemia, mixed Now on crestor per cardiology Will check labs and liver functions - Lipid panel - Comprehensive metabolic panel  2. Periodic headache syndrome, not intractable Continue propranolol bid (no benefit from elavil) Fioricet  PRN  3. Insomnia due to medical condition Chronic stable symptoms Continue Ambien PRN  4. Peptic ulcer disease Seen by GI - EGD showed esophagitis which was causing atypical cough and congestion Much improved with BID Pantoprazole - will continue  5. Sjogren's syndrome without extraglandular involvement ([place])  6. Pruritus Pt has eliminated detergent and bath products with some improvement Recommend Claritin bid if needed Benadryl at HS No recent change  in medications or supplement to explain sx   Partially dictated using Editor, commissioning. Any errors are unintentional.  Halina Maidens, MD [facility]  01/25/2020

## 2020-01-26 LAB — COMPREHENSIVE METABOLIC PANEL
ALT: 21 IU/L (ref 0–32)
AST: 20 IU/L (ref 0–40)
Albumin/Globulin Ratio: 1.8 (ref 1.2–2.2)
Albumin: 4.4 g/dL (ref 3.8–4.9)
Alkaline Phosphatase: 84 IU/L (ref 48–121)
BUN/Creatinine Ratio: 18 (ref 12–28)
BUN: 14 mg/dL (ref 8–27)
Bilirubin Total: 0.6 mg/dL (ref 0.0–1.2)
CO2: 30 mmol/L — ABNORMAL HIGH (ref 20–29)
Calcium: 10.1 mg/dL (ref 8.7–10.3)
Chloride: 99 mmol/L (ref 96–106)
Creatinine, Ser: 0.8 mg/dL (ref 0.57–1.00)
GFR calc Af Amer: 93 mL/min/{1.73_m2} (ref >59)
GFR calc non Af Amer: 80 mL/min/{1.73_m2} (ref >59)
Globulin, Total: 2.5 g/dL (ref 1.5–4.5)
Glucose: 92 mg/dL (ref 65–99)
Potassium: 4.6 mmol/L (ref 3.5–5.2)
Sodium: 141 mmol/L (ref 134–144)
Total Protein: 6.9 g/dL (ref 6.0–8.5)

## 2020-01-26 LAB — LIPID PANEL
Chol/HDL Ratio: 2.2 ratio (ref 0.0–4.4)
Cholesterol, Total: 158 mg/dL (ref 100–199)
HDL: 72 mg/dL (ref >39)
LDL Chol Calc (NIH): 59 mg/dL (ref 0–99)
Triglycerides: 165 mg/dL — ABNORMAL HIGH (ref 0–149)
VLDL Cholesterol Cal: 27 mg/dL (ref 5–40)

## 2020-02-06 ENCOUNTER — Other Ambulatory Visit: Payer: Self-pay | Admitting: Internal Medicine

## 2020-02-06 ENCOUNTER — Other Ambulatory Visit: Payer: Self-pay | Admitting: Gastroenterology

## 2020-02-06 DIAGNOSIS — M797 Fibromyalgia: Secondary | ICD-10-CM

## 2020-02-06 DIAGNOSIS — J3089 Other allergic rhinitis: Secondary | ICD-10-CM

## 2020-02-06 DIAGNOSIS — E034 Atrophy of thyroid (acquired): Secondary | ICD-10-CM

## 2020-02-06 NOTE — Telephone Encounter (Signed)
Requested Prescriptions  Pending Prescriptions Disp Refills  . DULoxetine (CYMBALTA) 60 MG capsule [Pharmacy Med Name: DULoxetine HCl 60 MG Oral Capsule Delayed Release Particles] 90 capsule 3    Sig: TAKE 1 CAPSULE BY MOUTH  DAILY     Psychiatry: Antidepressants - SNRI Passed - 02/06/2020  4:48 AM      Passed - Last BP in normal range    BP Readings from Last 1 Encounters:  01/25/20 128/78         Passed - Valid encounter within last 6 months    Recent Outpatient Visits          1 week ago Hyperlipidemia, mixed   [facility] Glean Hess, MD   7 months ago Annual physical exam   [facility] Glean Hess, MD   1 year ago Essential hypertension   [facility] Glean Hess, MD   1 year ago Annual physical exam   [facility] Glean Hess, MD   2 years ago Essential hypertension   [facility] Glean Hess, MD      Future Appointments            In 5 months Army Melia Jesse Sans, MD [facility]           . montelukast (SINGULAIR) 10 MG tablet [Pharmacy Med Name: Montelukast Sodium 10 MG Oral Tablet] 90 tablet 3    Sig: TAKE 1 TABLET BY MOUTH AT  BEDTIME     Pulmonology:  Leukotriene Inhibitors Passed - 02/06/2020  4:48 AM      Passed - Valid encounter within last 12 months    Recent Outpatient Visits          1 week ago Hyperlipidemia, mixed   [facility] Glean Hess, MD   7 months ago Annual physical exam   [facility] Glean Hess, MD   1 year ago Essential hypertension   [facility] Glean Hess, MD   1 year ago Annual physical exam   [facility] Glean Hess, MD   2 years ago Essential hypertension   [facility] Glean Hess, MD      Future Appointments            In 5 months Army Melia Jesse Sans, MD [facility]           . levothyroxine (SYNTHROID) 88 MCG tablet [Pharmacy Med Name:  Levothyroxine Sodium 88 MCG Oral Tablet] 90 tablet 3    Sig: TAKE 1 TABLET BY MOUTH  DAILY BEFORE BREAKFAST     Endocrinology:  Hypothyroid Agents Failed - 02/06/2020  4:48 AM      Failed - TSH needs to be rechecked within 3 months after an abnormal result. Refill until TSH is due.      Passed - TSH in normal range and within 360 days    TSH  Date Value Ref Range Status  06/25/2019 3.940 0.450 - 4.500 uIU/mL Final         Passed - Valid encounter within last 12 months    Recent Outpatient Visits          1 week ago Hyperlipidemia, mixed   [facility] Glean Hess, MD   7 months ago Annual physical exam   [facility] Glean Hess, MD   1 year ago Essential hypertension   [facility] Glean Hess, MD  1 year ago Annual physical exam   [facility] Glean Hess, MD   2 years ago Essential hypertension   [place], [place]      Future Appointments            In 5 months Army Melia Jesse Sans, MD [facility], [place]           . gabapentin (NEURONTIN) 600 MG tablet [Pharmacy Med Name: GABAPENTIN  600MG   TAB] 270 tablet 3    Sig: TAKE 1 TABLET BY MOUTH 3  TIMES DAILY     Neurology: Anticonvulsants - gabapentin Passed - 02/06/2020  4:48 AM      Passed - Valid encounter within last 12 months    Recent Outpatient Visits          1 week ago Hyperlipidemia, mixed   [facility] Glean Hess, MD   7 months ago Annual physical exam   [facility] Glean Hess, MD   1 year ago Essential hypertension   [facility] Glean Hess, MD   1 year ago Annual physical exam   [facility] Glean Hess, MD   2 years ago Essential hypertension   [facility] Glean Hess, MD      Future Appointments            In 5 months Army Melia Jesse Sans, MD [facility], [facility]

## 2020-02-08 DIAGNOSIS — I493 Ventricular premature depolarization: Secondary | ICD-10-CM | POA: Diagnosis not present

## 2020-02-08 DIAGNOSIS — E782 Mixed hyperlipidemia: Secondary | ICD-10-CM | POA: Diagnosis not present

## 2020-02-08 DIAGNOSIS — I10 Essential (primary) hypertension: Secondary | ICD-10-CM | POA: Diagnosis not present

## 2020-02-17 ENCOUNTER — Other Ambulatory Visit: Payer: Self-pay | Admitting: Internal Medicine

## 2020-02-17 DIAGNOSIS — Z1231 Encounter for screening mammogram for malignant neoplasm of breast: Secondary | ICD-10-CM

## 2020-02-28 ENCOUNTER — Other Ambulatory Visit: Payer: Self-pay

## 2020-02-28 ENCOUNTER — Encounter: Payer: Self-pay | Admitting: Gastroenterology

## 2020-02-29 ENCOUNTER — Telehealth: Payer: Self-pay | Admitting: Internal Medicine

## 2020-02-29 ENCOUNTER — Other Ambulatory Visit: Payer: Self-pay | Admitting: Internal Medicine

## 2020-02-29 DIAGNOSIS — L03113 Cellulitis of right upper limb: Secondary | ICD-10-CM

## 2020-02-29 MED ORDER — DOXYCYCLINE HYCLATE 100 MG PO TABS: 100.0000 mg | ORAL_TABLET | Freq: Two times a day (BID) | ORAL | 0 refills | Status: AC

## 2020-02-29 NOTE — Telephone Encounter (Signed)
If she is allergic to bees she needs to take Benadryl 25 mg qid to reduce the swelling and redness.  Antibiotics are usually not indicated.

## 2020-02-29 NOTE — Telephone Encounter (Signed)
Patient informed and scheduled for 1120 Am tomorrow.   CM

## 2020-02-29 NOTE — Telephone Encounter (Signed)
Patient states PCP is aware she is allergic to bee stings, patient requesting an antibotic. Patient was stung this morning on the base of her right thumb, patient experiencing redness and slight swollen. Patient would like a follow up call today, please advise   [place], [place] Phone:  [phone]  Fax:  [phone]

## 2020-02-29 NOTE — Telephone Encounter (Signed)
I will send in an antibiotic but she needs to come in tomorrow for a face to face since I do not give antibiotics without seeing a patient.

## 2020-03-01 ENCOUNTER — Ambulatory Visit (INDEPENDENT_AMBULATORY_CARE_PROVIDER_SITE_OTHER): Payer: Medicare Other | Admitting: Internal Medicine

## 2020-03-01 ENCOUNTER — Encounter: Payer: Self-pay | Admitting: Internal Medicine

## 2020-03-01 ENCOUNTER — Other Ambulatory Visit: Payer: Self-pay

## 2020-03-01 VITALS — BP 122/74 | HR 68 | Ht 65.0 in | Wt 191.0 lb

## 2020-03-01 DIAGNOSIS — H60543 Acute eczematoid otitis externa, bilateral: Secondary | ICD-10-CM

## 2020-03-01 DIAGNOSIS — L03113 Cellulitis of right upper limb: Secondary | ICD-10-CM

## 2020-03-01 MED ORDER — MOMETASONE FUROATE 0.1 % EX SOLN: Freq: Every day | CUTANEOUS | 1 refills | Status: AC

## 2020-03-01 NOTE — Progress Notes (Signed)
Date:  03/01/2020   Name:  Ashley Glover   DOB:  [birth year]   MRN:  [id number]   Chief Complaint: Insect Bite  Hand Pain  The incident occurred 12 to 24 hours ago. The incident occurred at home. Injury mechanism: bee sting. The pain is present in the right hand. The quality of the pain is described as aching and burning. The pain does not radiate. The pain is mild. Pertinent negatives include no chest pain or muscle weakness. She has tried ice and NSAIDs (and benadryl) for the symptoms. The treatment provided moderate relief.  She is concerned that it will become infected which has occurred in the past with bee stings.    Lab Results  Component Value Date   CREATININE 0.80 01/25/2020   BUN 14 01/25/2020   NA 141 01/25/2020   K 4.6 01/25/2020   CL 99 01/25/2020   CO2 30 (H) 01/25/2020   Lab Results  Component Value Date   CHOL 158 01/25/2020   HDL 72 01/25/2020   LDLCALC 59 01/25/2020   TRIG 165 (H) 01/25/2020   CHOLHDL 2.2 01/25/2020   Lab Results  Component Value Date   TSH 3.940 06/25/2019   Lab Results  Component Value Date   HGBA1C 5.7 (H) 05/26/2018   Lab Results  Component Value Date   WBC 8.4 06/25/2019   HGB 13.8 06/25/2019   HCT 42.0 06/25/2019   MCV 89 06/25/2019   PLT 250 06/25/2019   Lab Results  Component Value Date   ALT 21 01/25/2020   AST 20 01/25/2020   ALKPHOS 84 01/25/2020   BILITOT 0.6 01/25/2020     Review of Systems  Constitutional: Negative for chills, diaphoresis and fatigue.  Respiratory: Negative for shortness of breath and wheezing.   Cardiovascular: Negative for chest pain and palpitations.  Musculoskeletal: Positive for joint swelling.  Skin: Positive for color change.    Patient Active Problem List   Diagnosis Date Noted  . Pruritus 01/25/2020  . Gastroesophageal reflux disease with esophagitis without hemorrhage   . Personal history of colonic polyps   . Polyp of ascending colon   . Insomnia 06/25/2019  .  Osteopenia determined by x-ray 06/23/2018  . Chronic otitis media 09/30/2017  . Migraine without aura and without status migrainosus, not intractable 05/21/2017  . Not currently working due to disabled status 01/24/2017  . Degenerative arthritis 01/24/2017  . Frequent PVCs 08/20/2016  . Hx of colonic polyps 04/24/2016  . Periodic headache syndrome, not intractable 01/31/2016  . Rosacea, acne 01/31/2016  . Essential hypertension 08/02/2015  . Hypothyroidism due to acquired atrophy of thyroid 08/02/2015  . Fibromyalgia 08/02/2015  . Sjogren's syndrome ([place]) 08/02/2015  . Peptic ulcer disease 08/02/2015  . Primary osteoarthritis of both knees 08/02/2015  . Osteopenia 08/02/2015  . Other allergic rhinitis 08/02/2015  . Hyperlipidemia, mixed 08/02/2015  . Hx of abnormal cervical Pap smear 08/02/2015    Allergies  Allergen Reactions  . Ace Inhibitors Cough  . Morphine And Related Nausea Only  . Savella [Milnacipran Hcl] Palpitations    Past Surgical History:  Procedure Laterality Date  . AUGMENTATION MAMMAPLASTY Bilateral 1987  . COLONOSCOPY  05/2012   polyps  . COLONOSCOPY WITH PROPOFOL N/A 06/20/2016   Procedure: COLONOSCOPY WITH PROPOFOL;  Surgeon: [name], MD;  Location: [place];  Service: Endoscopy;  Laterality: N/A;  . COLONOSCOPY WITH PROPOFOL N/A 11/11/2019   Procedure: COLONOSCOPY WITH PROPOFOL;  Surgeon: [name], MD;  Location: [facility];  Service: Endoscopy;  Laterality: N/A;  3  . ESOPHAGOGASTRODUODENOSCOPY (EGD) WITH PROPOFOL N/A 06/20/2016   Procedure: ESOPHAGOGASTRODUODENOSCOPY (EGD) WITH PROPOFOL;  Surgeon: [name], MD;  Location: [place];  Service: Endoscopy;  Laterality: N/A;  . ESOPHAGOGASTRODUODENOSCOPY (EGD) WITH PROPOFOL N/A 11/11/2019   Procedure: ESOPHAGOGASTRODUODENOSCOPY (EGD) WITH PROPOFOL;  Surgeon: [name], MD;  Location: [place];  Service: Endoscopy;  Laterality: N/A;  . FOOT SURGERY Right    . GANGLION CYST EXCISION Left   . NASAL SINUS SURGERY    . NEUROMA SURGERY Left   . PARTIAL HYSTERECTOMY  1996   +HPV and cervical dysplasia  . PLACEMENT OF BREAST IMPLANTS    . POLYPECTOMY  06/20/2016   Procedure: POLYPECTOMY;  Surgeon: [name], MD;  Location: [place];  Service: Endoscopy;;  . REPLACEMENT TOTAL KNEE Right 06/2014  . TONSILLECTOMY      Social History   Tobacco Use  . Smoking status: Never Smoker  . Smokeless tobacco: Never Used  Vaping Use  . Vaping Use: Never used  Substance Use Topics  . Alcohol use: No    Alcohol/week: 0.0 standard drinks  . Drug use: No     Medication list has been reviewed and updated.  Current Meds  Medication Sig  . albuterol (VENTOLIN HFA) 108 (90 Base) MCG/ACT inhaler Inhale 2 puffs into the lungs every 6 (six) hours as needed for wheezing or shortness of breath.  Marland Kitchen aspirin (ASPIRIN 81) 81 MG EC tablet Take 81 mg by mouth daily. Swallow whole.  . butalbital-acetaminophen-caffeine (FIORICET WITH CODEINE) 50-325-40-30 MG capsule TAKE 1 CAPSULE BY MOUTH EVERY 6 (SIX) HOURS AS NEEDED FOR HEADACHE.  . calcium-vitamin D (OSCAL WITH D) 250-125 MG-UNIT tablet Take 1 tablet by mouth daily.  Marland Kitchen doxycycline (VIBRA-TABS) 100 MG tablet Take 1 tablet (100 mg total) by mouth 2 (two) times daily for 7 days.  . DULoxetine (CYMBALTA) 60 MG capsule TAKE 1 CAPSULE BY MOUTH  DAILY  . furosemide (LASIX) 40 MG tablet Take 1 tablet (40 mg total) by mouth daily.  Marland Kitchen gabapentin (NEURONTIN) 600 MG tablet TAKE 1 TABLET BY MOUTH 3  TIMES DAILY  . hydrochlorothiazide (HYDRODIURIL) 25 MG tablet Take 12.5 mg by mouth daily.  . hydrocortisone 2.5 % cream APPLY TOPICALLY TWO TIMES   DAILY  . levothyroxine (SYNTHROID) 88 MCG tablet TAKE 1 TABLET BY MOUTH  DAILY BEFORE BREAKFAST  . loratadine (CLARITIN) 10 MG tablet Take 10 mg by mouth daily.  . mometasone (ELOCON) 0.1 % lotion Apply 1-2 application topically daily as needed. For Ear itching.  .  montelukast (SINGULAIR) 10 MG tablet TAKE 1 TABLET BY MOUTH AT  BEDTIME  . Multiple Vitamin (MULTIVITAMIN ADULT PO) Take 1 tablet by mouth daily.  . Omega-3 Fatty Acids (FISH OIL) 1000 MG CAPS Take by mouth.  . pantoprazole (PROTONIX) 40 MG tablet TAKE 1 TABLET BY MOUTH  TWICE DAILY  . promethazine (PHENERGAN) 25 MG tablet Take 1 tablet (25 mg total) by mouth every 8 (eight) hours as needed for nausea or vomiting.  . propranolol (INDERAL) 40 MG tablet TAKE 1 TABLET BY MOUTH  TWICE DAILY  . rosuvastatin (CRESTOR) 20 MG tablet Take 20 mg by mouth at bedtime.  Marland Kitchen tiZANidine (ZANAFLEX) 4 MG tablet TAKE 1 TABLET BY MOUTH 3  TIMES DAILY  . tobramycin-dexamethasone (TOBRADEX) ophthalmic solution Place 1 drop into both eyes every 6 (six) hours.  Marland Kitchen zolpidem (AMBIEN CR) 6.25 MG CR tablet Take 1 tablet (6.25  mg total) by mouth at bedtime as needed for sleep.    PHQ 2/9 Scores 06/21/2019 12/22/2018 05/26/2018 05/01/2017  PHQ - 2 Score 0 0 0 0  PHQ- 9 Score - 0 - -    No flowsheet data found.  BP Readings from Last 3 Encounters:  03/01/20 122/74  01/25/20 128/78  11/25/19 127/90    Physical Exam Vitals and nursing note reviewed.  Constitutional:      General: She is not in acute distress.    Appearance: She is well-developed.  HENT:     Head: Normocephalic and atraumatic.  Cardiovascular:     Rate and Rhythm: Normal rate and regular rhythm.  Pulmonary:     Effort: Pulmonary effort is normal. No respiratory distress.     Breath sounds: No wheezing or rhonchi.  Musculoskeletal:        General: Normal range of motion.  Skin:    General: Skin is warm and dry.     Findings: Erythema present. No rash.     Comments: entire dorsum of right hand is red and warm, slight swelling. Insect sting site at base of thumb - tender but no evidence of bacterial infection   Neurological:     Mental Status: She is alert and oriented to person, place, and time.  Psychiatric:        Behavior: Behavior normal.         Thought Content: Thought content normal.     Wt Readings from Last 3 Encounters:  03/01/20 191 lb (86.6 kg)  01/25/20 193 lb (87.5 kg)  11/25/19 197 lb 3.2 oz (89.4 kg)    BP 122/74   Pulse 68   Ht 5\' 5"  (1.651 m)   Wt 191 lb (86.6 kg)   SpO2 98%   BMI 31.78 kg/m   Assessment and Plan: 1. Cellulitis of right upper extremity Continue benadryl and elevation Begin Doxycycline bid x 7 days  2. Dermatitis of both ear canals - mometasone (ELOCON) 0.1 % lotion; Apply topically daily. To both ears as needed  Dispense: 60 mL; Refill: 1   Partially dictated using Editor, commissioning. Any errors are unintentional.  Halina Maidens, MD [facility]  03/01/2020

## 2020-03-02 ENCOUNTER — Other Ambulatory Visit
Admission: RE | Admit: 2020-03-02 | Discharge: 2020-03-02 | Disposition: A | Discharge status: Home / Self Care | Payer: Medicare Other | Source: Ambulatory Visit | Attending: Gastroenterology | Admitting: Gastroenterology

## 2020-03-02 DIAGNOSIS — Z01818 Encounter for other preprocedural examination: Secondary | ICD-10-CM | POA: Diagnosis not present

## 2020-03-02 DIAGNOSIS — Z20822 Contact with and (suspected) exposure to covid-19: Secondary | ICD-10-CM | POA: Insufficient documentation

## 2020-03-02 LAB — SARS CORONAVIRUS 2 (TAT 6-24 HRS): SARS Coronavirus 2: NEGATIVE

## 2020-03-03 NOTE — Discharge Instructions (Signed)
General Anesthesia, Adult, Care After This sheet gives you information about how to care for yourself after your procedure. Your health care provider may also give you more specific instructions. If you have problems or questions, contact your health care provider. What can I expect after the procedure? After the procedure, the following side effects are common:  Pain or discomfort at the IV site.  Nausea.  Vomiting.  Sore throat.  Trouble concentrating.  Feeling cold or chills.  Weak or tired.  Sleepiness and fatigue.  Soreness and body aches. These side effects can affect parts of the body that were not involved in surgery. Follow these instructions at home:  For at least 24 hours after the procedure:  Have a responsible adult stay with you. It is important to have someone help care for you until you are awake and alert.  Rest as needed.  Do not: ? Participate in activities in which you could fall or become injured. ? Drive. ? Use heavy machinery. ? Drink alcohol. ? Take sleeping pills or medicines that cause drowsiness. ? Make important decisions or sign legal documents. ? Take care of children on your own. Eating and drinking  Follow any instructions from your health care provider about eating or drinking restrictions.  When you feel hungry, start by eating small amounts of foods that are soft and easy to digest (bland), such as toast. Gradually return to your regular diet.  Drink enough fluid to keep your urine pale yellow.  If you vomit, rehydrate by drinking water, juice, or clear broth. General instructions  If you have sleep apnea, surgery and certain medicines can increase your risk for breathing problems. Follow instructions from your health care provider about wearing your sleep device: ? Anytime you are sleeping, including during daytime naps. ? While taking prescription pain medicines, sleeping medicines, or medicines that make you drowsy.  Return to  your normal activities as told by your health care provider. Ask your health care provider what activities are safe for you.  Take over-the-counter and prescription medicines only as told by your health care provider.  If you smoke, do not smoke without supervision.  Keep all follow-up visits as told by your health care provider. This is important. Contact a health care provider if:  You have nausea or vomiting that does not get better with medicine.  You cannot eat or drink without vomiting.  You have pain that does not get better with medicine.  You are unable to pass urine.  You develop a skin rash.  You have a fever.  You have redness around your IV site that gets worse. Get help right away if:  You have difficulty breathing.  You have chest pain.  You have blood in your urine or stool, or you vomit blood. Summary  After the procedure, it is common to have a sore throat or nausea. It is also common to feel tired.  Have a responsible adult stay with you for the first 24 hours after general anesthesia. It is important to have someone help care for you until you are awake and alert.  When you feel hungry, start by eating small amounts of foods that are soft and easy to digest (bland), such as toast. Gradually return to your regular diet.  Drink enough fluid to keep your urine pale yellow.  Return to your normal activities as told by your health care provider. Ask your health care provider what activities are safe for you. This information is not   intended to replace advice given to you by your health care provider. Make sure you discuss any questions you have with your health care provider. Document Revised: 05/16/2017 Document Reviewed: 12/27/2016 Elsevier Patient Education  2020 Elsevier Inc.  

## 2020-03-06 ENCOUNTER — Encounter: Payer: Self-pay | Admitting: Gastroenterology

## 2020-03-06 ENCOUNTER — Ambulatory Visit: Payer: Medicare Other | Admitting: Anesthesiology

## 2020-03-06 ENCOUNTER — Other Ambulatory Visit: Payer: Self-pay

## 2020-03-06 ENCOUNTER — Ambulatory Visit
Admission: RE | Admit: 2020-03-06 | Discharge: 2020-03-06 | Disposition: A | Discharge status: Home / Self Care | Payer: Medicare Other | Attending: Gastroenterology | Admitting: Gastroenterology

## 2020-03-06 ENCOUNTER — Encounter
Admission: RE | Disposition: A | Discharge status: Home / Self Care | Payer: Self-pay | Source: Home / Self Care | Attending: Gastroenterology

## 2020-03-06 DIAGNOSIS — D122 Benign neoplasm of ascending colon: Secondary | ICD-10-CM | POA: Diagnosis not present

## 2020-03-06 DIAGNOSIS — K449 Diaphragmatic hernia without obstruction or gangrene: Secondary | ICD-10-CM | POA: Diagnosis not present

## 2020-03-06 DIAGNOSIS — K2100 Gastro-esophageal reflux disease with esophagitis, without bleeding: Secondary | ICD-10-CM | POA: Diagnosis not present

## 2020-03-06 DIAGNOSIS — K635 Polyp of colon: Secondary | ICD-10-CM | POA: Diagnosis not present

## 2020-03-06 DIAGNOSIS — Z86010 Personal history of colonic polyps: Secondary | ICD-10-CM | POA: Diagnosis not present

## 2020-03-06 DIAGNOSIS — D123 Benign neoplasm of transverse colon: Secondary | ICD-10-CM | POA: Diagnosis not present

## 2020-03-06 DIAGNOSIS — K2900 Acute gastritis without bleeding: Secondary | ICD-10-CM | POA: Diagnosis not present

## 2020-03-06 DIAGNOSIS — M797 Fibromyalgia: Secondary | ICD-10-CM | POA: Diagnosis not present

## 2020-03-06 DIAGNOSIS — K2970 Gastritis, unspecified, without bleeding: Secondary | ICD-10-CM | POA: Diagnosis not present

## 2020-03-06 DIAGNOSIS — Z885 Allergy status to narcotic agent status: Secondary | ICD-10-CM | POA: Diagnosis not present

## 2020-03-06 DIAGNOSIS — E785 Hyperlipidemia, unspecified: Secondary | ICD-10-CM | POA: Insufficient documentation

## 2020-03-06 DIAGNOSIS — E039 Hypothyroidism, unspecified: Secondary | ICD-10-CM | POA: Insufficient documentation

## 2020-03-06 DIAGNOSIS — Z79890 Hormone replacement therapy (postmenopausal): Secondary | ICD-10-CM | POA: Diagnosis not present

## 2020-03-06 DIAGNOSIS — Z09 Encounter for follow-up examination after completed treatment for conditions other than malignant neoplasm: Secondary | ICD-10-CM | POA: Insufficient documentation

## 2020-03-06 DIAGNOSIS — Z79899 Other long term (current) drug therapy: Secondary | ICD-10-CM | POA: Diagnosis not present

## 2020-03-06 DIAGNOSIS — Z888 Allergy status to other drugs, medicaments and biological substances status: Secondary | ICD-10-CM | POA: Diagnosis not present

## 2020-03-06 DIAGNOSIS — I10 Essential (primary) hypertension: Secondary | ICD-10-CM | POA: Insufficient documentation

## 2020-03-06 DIAGNOSIS — D125 Benign neoplasm of sigmoid colon: Secondary | ICD-10-CM | POA: Insufficient documentation

## 2020-03-06 DIAGNOSIS — M3500 Sicca syndrome, unspecified: Secondary | ICD-10-CM | POA: Insufficient documentation

## 2020-03-06 DIAGNOSIS — K209 Esophagitis, unspecified without bleeding: Secondary | ICD-10-CM

## 2020-03-06 HISTORY — PX: COLONOSCOPY WITH PROPOFOL: SHX5780

## 2020-03-06 HISTORY — PX: POLYPECTOMY: SHX5525

## 2020-03-06 HISTORY — PX: ESOPHAGOGASTRODUODENOSCOPY (EGD) WITH PROPOFOL: SHX5813

## 2020-03-06 SURGERY — COLONOSCOPY WITH PROPOFOL
Anesthesia: General | Site: Rectum

## 2020-03-06 MED ORDER — SODIUM CHLORIDE 0.9 % IV SOLN: INTRAVENOUS | Status: DC

## 2020-03-06 MED ORDER — LACTATED RINGERS IV SOLN: INTRAVENOUS | Status: DC

## 2020-03-06 MED ORDER — STERILE WATER FOR IRRIGATION IR SOLN: Status: DC | PRN

## 2020-03-06 MED ORDER — PROPOFOL 10 MG/ML IV BOLUS
INTRAVENOUS | Status: DC | PRN
  Administered 2020-03-06: 30 mg via INTRAVENOUS
  Administered 2020-03-06: 40 mg via INTRAVENOUS
  Administered 2020-03-06 (×2): 30 mg via INTRAVENOUS
  Administered 2020-03-06: 40 mg via INTRAVENOUS
  Administered 2020-03-06 (×4): 30 mg via INTRAVENOUS
  Administered 2020-03-06: 60 mg via INTRAVENOUS
  Administered 2020-03-06: 20 mg via INTRAVENOUS
  Administered 2020-03-06 (×2): 30 mg via INTRAVENOUS

## 2020-03-06 MED ORDER — LIDOCAINE HCL (CARDIAC) PF 100 MG/5ML IV SOSY
PREFILLED_SYRINGE | INTRAVENOUS | Status: DC | PRN
  Administered 2020-03-06: 60 mg via INTRAVENOUS

## 2020-03-06 SURGICAL SUPPLY — 10 items
BLOCK BITE 60FR ADLT L/F GRN (MISCELLANEOUS) ×4 IMPLANT
FORCEPS BIOP RAD 4 LRG CAP 4 (CUTTING FORCEPS) ×4 IMPLANT
GOWN CVR UNV OPN BCK APRN NK (MISCELLANEOUS) ×4 IMPLANT
GOWN ISOL THUMB LOOP REG UNIV (MISCELLANEOUS) ×8
KIT PRC NS LF DISP ENDO (KITS) ×2 IMPLANT
KIT PROCEDURE OLYMPUS (KITS) ×4
MANIFOLD NEPTUNE II (INSTRUMENTS) ×4 IMPLANT
SNARE SHORT THROW 13M SML OVAL (MISCELLANEOUS) ×4 IMPLANT
TRAP ETRAP POLY (MISCELLANEOUS) ×4 IMPLANT
WATER STERILE IRR 250ML POUR (IV SOLUTION) ×4 IMPLANT

## 2020-03-06 NOTE — Anesthesia Procedure Notes (Signed)
Date/Time: 03/06/2020 7:49 AM Performed by: Dionne Bucy, CRNA Pre-anesthesia Checklist: Patient identified, Emergency Drugs available, Suction available, Patient being monitored and Timeout performed Patient Re-evaluated:Patient Re-evaluated prior to induction Oxygen Delivery Method: Nasal cannula Placement Confirmation: positive ETCO2

## 2020-03-06 NOTE — H&P (Signed)
[name], MD [address] [place], [place] [address] Phone:[phone] Fax : [phone]  Primary Care Physician:  Glean Hess, MD Primary Gastroenterologist:  Dr. Allen Norris  Pre-Procedure History & Physical: HPI:  Ashley Glover is a 59 y.o. female is here for an endoscopy and colonoscopy.   Past Medical History:  Diagnosis Date  . Allergic rhinitis   . Benign neoplasm of ascending colon   . Benign neoplasm of cecum   . Degenerative joint disease   . Fibromyalgia   . GERD (gastroesophageal reflux disease)   . Hyperlipidemia   . Hypertension   . Hypothyroid   . Mitral valve prolapse    states it is mild and does not see a cardiologist  . Redness of both eyes 09/30/2017  . Sjogrens syndrome [facility])     Past Surgical History:  Procedure Laterality Date  . AUGMENTATION MAMMAPLASTY Bilateral 1987  . COLONOSCOPY  05/2012   polyps  . COLONOSCOPY WITH PROPOFOL N/A 06/20/2016   Procedure: COLONOSCOPY WITH PROPOFOL;  Surgeon: [name], MD;  Location: [place];  Service: Endoscopy;  Laterality: N/A;  . COLONOSCOPY WITH PROPOFOL N/A 11/11/2019   Procedure: COLONOSCOPY WITH PROPOFOL;  Surgeon: [name], MD;  Location: [place];  Service: Endoscopy;  Laterality: N/A;  3  . ESOPHAGOGASTRODUODENOSCOPY (EGD) WITH PROPOFOL N/A 06/20/2016   Procedure: ESOPHAGOGASTRODUODENOSCOPY (EGD) WITH PROPOFOL;  Surgeon: [name], MD;  Location: [place];  Service: Endoscopy;  Laterality: N/A;  . ESOPHAGOGASTRODUODENOSCOPY (EGD) WITH PROPOFOL N/A 11/11/2019   Procedure: ESOPHAGOGASTRODUODENOSCOPY (EGD) WITH PROPOFOL;  Surgeon: [name], MD;  Location: [place];  Service: Endoscopy;  Laterality: N/A;  . FOOT SURGERY Right   . GANGLION CYST EXCISION Left   . NASAL SINUS SURGERY    . NEUROMA SURGERY Left   . PARTIAL HYSTERECTOMY  1996   +HPV and cervical dysplasia  . PLACEMENT OF BREAST IMPLANTS    . POLYPECTOMY  06/20/2016    Procedure: POLYPECTOMY;  Surgeon: [name], MD;  Location: [place];  Service: Endoscopy;;  . REPLACEMENT TOTAL KNEE Right 06/2014  . TONSILLECTOMY      Prior to Admission medications   Medication Sig Start Date End Date Taking? Authorizing Provider  calcium-vitamin D (OSCAL WITH D) 250-125 MG-UNIT tablet Take 1 tablet by mouth daily.   Yes [provider]  DULoxetine (CYMBALTA) 60 MG capsule TAKE 1 CAPSULE BY MOUTH  DAILY 02/06/20  Yes Glean Hess, MD  furosemide (LASIX) 40 MG tablet Take 1 tablet (40 mg total) by mouth daily. 05/12/17  Yes Glean Hess, MD  gabapentin (NEURONTIN) 600 MG tablet TAKE 1 TABLET BY MOUTH 3  TIMES DAILY 02/06/20  Yes Glean Hess, MD  hydrochlorothiazide (HYDRODIURIL) 25 MG tablet Take 12.5 mg by mouth daily.   Yes [provider]  levothyroxine (SYNTHROID) 88 MCG tablet TAKE 1 TABLET BY MOUTH  DAILY BEFORE BREAKFAST 02/06/20  Yes Glean Hess, MD  loratadine (CLARITIN) 10 MG tablet Take 10 mg by mouth daily.   Yes [provider]  montelukast (SINGULAIR) 10 MG tablet TAKE 1 TABLET BY MOUTH AT  BEDTIME 02/06/20  Yes Glean Hess, MD  Multiple Vitamin (MULTIVITAMIN ADULT PO) Take 1 tablet by mouth daily.   Yes [provider]  Omega-3 Fatty Acids (FISH OIL) 1000 MG CAPS Take by mouth.   Yes [provider]  pantoprazole (PROTONIX) 40 MG tablet TAKE 1 TABLET BY MOUTH  TWICE DAILY  02/07/20  Yes [name], MD  propranolol (INDERAL) 40 MG tablet TAKE 1 TABLET BY MOUTH  TWICE DAILY 07/20/19  Yes Glean Hess, MD  rosuvastatin (CRESTOR) 20 MG tablet Take 20 mg by mouth at bedtime. 12/06/19  Yes [provider]  tiZANidine (ZANAFLEX) 4 MG tablet TAKE 1 TABLET BY MOUTH 3  TIMES DAILY 05/04/19  Yes Glean Hess, MD  zolpidem (AMBIEN CR) 6.25 MG CR tablet Take 1 tablet (6.25 mg total) by mouth at bedtime as needed for sleep. 06/28/19  Yes Glean Hess, MD  albuterol (VENTOLIN  HFA) 108 (90 Base) MCG/ACT inhaler Inhale 2 puffs into the lungs every 6 (six) hours as needed for wheezing or shortness of breath. 12/22/18   Glean Hess, MD  aspirin (ASPIRIN 81) 81 MG EC tablet Take 81 mg by mouth daily. Swallow whole.    [provider]  butalbital-acetaminophen-caffeine (FIORICET WITH CODEINE) 50-325-40-30 MG capsule TAKE 1 CAPSULE BY MOUTH EVERY 6 (SIX) HOURS AS NEEDED FOR HEADACHE. 10/15/19   Glean Hess, MD  doxycycline (VIBRA-TABS) 100 MG tablet Take 1 tablet (100 mg total) by mouth 2 (two) times daily for 7 days. 02/29/20 03/07/20  Glean Hess, MD  hydrocortisone 2.5 % cream APPLY TOPICALLY TWO TIMES   DAILY 12/06/19   Glean Hess, MD  mometasone (ELOCON) 0.1 % lotion Apply topically daily. To both ears as needed 03/01/20   Glean Hess, MD  promethazine (PHENERGAN) 25 MG tablet Take 1 tablet (25 mg total) by mouth every 8 (eight) hours as needed for nausea or vomiting. 12/22/18   Glean Hess, MD  tobramycin-dexamethasone [facility]) ophthalmic solution Place 1 drop into both eyes every 6 (six) hours. 12/22/18   Glean Hess, MD    Allergies as of 12/07/2019 - Review Complete 11/11/2019  Allergen Reaction Noted  . Ace inhibitors Cough 11/21/2017  . Morphine and related Nausea Only 08/02/2015  . Savella [milnacipran hcl] Palpitations 08/02/2015    Family History  Problem Relation Age of Onset  . Pulmonary embolism Mother   . Hypertension Mother   . AVM Father   . Hypertension Father   . Hypothyroidism Father   . Breast cancer Neg Hx     Social History   Socioeconomic History  . Marital status: Divorced    Spouse name: Not on file  . Number of children: 1  . Years of education: Not on file  . Highest education level: Not on file  Occupational History  . Occupation: disabled  Tobacco Use  . Smoking status: Never Smoker  . Smokeless tobacco: Never Used  Vaping Use  . Vaping Use: Never used  Substance and Sexual  Activity  . Alcohol use: No    Alcohol/week: 0.0 standard drinks  . Drug use: No  . Sexual activity: Not on file  Other Topics Concern  . Not on file  Social History Narrative   Patient is on Soc Sec Disability.  She has been disabled for a number of years from Degenerative arthritis and Fibromyalgia.   Social Determinants of Health   Financial Resource Strain: Low Risk   . Difficulty of Paying Living Expenses: Not hard at all  Food Insecurity: No Food Insecurity  . Worried About Charity fundraiser in the Last Year: Never true  . Ran Out of Food in the Last Year: Never true  Transportation Needs: No Transportation Needs  . Lack of Transportation (Medical): No  . Lack of Transportation (Non-Medical):  No  Physical Activity: Insufficiently Active  . Days of Exercise per Week: 3 days  . Minutes of Exercise per Session: 30 min  Stress: No Stress Concern Present  . Feeling of Stress : Not at all  Social Connections: Unknown  . Frequency of Communication with Friends and Family: Patient refused  . Frequency of Social Gatherings with Friends and Family: Patient refused  . Attends Religious Services: Patient refused  . Active Member of Clubs or Organizations: Patient refused  . Attends Archivist Meetings: Patient refused  . Marital Status: Divorced  Human resources officer Violence: Not At Risk  . Fear of Current or Ex-Partner: No  . Emotionally Abused: No  . Physically Abused: No  . Sexually Abused: No    Review of Systems: See HPI, otherwise negative ROS  Physical Exam: Ht 5\' 5"  (1.651 m)   Wt 88.5 kg   BMI 32.45 kg/m  General:   Alert,  pleasant and cooperative in NAD Head:  Normocephalic and atraumatic. Neck:  Supple; no masses or thyromegaly. Lungs:  Clear throughout to auscultation.    Heart:  Regular rate and rhythm. Abdomen:  Soft, nontender and nondistended. Normal bowel sounds, without guarding, and without rebound.   Neurologic:  Alert and  oriented x4;   grossly normal neurologically.  Impression/Plan: BAYLYNN SHIFFLETT is here for an endoscopy and colonoscopy to be performed for esophagitis and history of a large colon polyp 10/2019  Risks, benefits, limitations, and alternatives regarding  endoscopy and colonoscopy have been reviewed with the patient.  Questions have been answered.  All parties agreeable.   [name], MD  03/06/2020, 7:29 AM

## 2020-03-06 NOTE — Op Note (Signed)
[facility] Gastroenterology Patient Name: Ashley Glover Procedure Date: 03/06/2020 7:36 AM MRN: [id number] Account #: [id number] Date of Birth: Aug 03, 1960 Admit Type: Outpatient Age: 59 Room: [facility] OR ROOM 01 Gender: Female Note Status: Finalized Procedure:             Upper GI endoscopy Indications:           Follow-up of reflux esophagitis Providers:             [name] MD, MD Referring MD:          Halina Maidens, MD (Referring MD) Medicines:             Propofol per Anesthesia Complications:         No immediate complications. Procedure:             Pre-Anesthesia Assessment:                        - Prior to the procedure, a History and Physical was                         performed, and patient medications and allergies were                         reviewed. The patient's tolerance of previous                         anesthesia was also reviewed. The risks and benefits                         of the procedure and the sedation options and risks                         were discussed with the patient. All questions were                         answered, and informed consent was obtained. Prior                         Anticoagulants: The patient has taken no previous                         anticoagulant or antiplatelet agents. ASA Grade                         Assessment: II - A patient with mild systemic disease.                         After reviewing the risks and benefits, the patient                         was deemed in satisfactory condition to undergo the                         procedure.                        After obtaining informed consent, the endoscope was  passed under direct vision. Throughout the procedure,                         the patient's blood pressure, pulse, and oxygen                         saturations were monitored continuously. The Endoscope                         was introduced through the mouth,  and advanced to the                         second part of duodenum. The upper GI endoscopy was                         accomplished without difficulty. The patient tolerated                         the procedure well. Findings:      A small hiatal hernia was present.      Localized moderate inflammation was found in the gastric antrum.       Biopsies were taken with a cold forceps for histology.      The examined duodenum was normal. Impression:            - Small hiatal hernia.                        - Gastritis. Biopsied.                        - Normal examined duodenum. Recommendation:        - Discharge patient to home.                        - Resume previous diet.                        - Continue present medications.                        - Await pathology results.                        - Perform a colonoscopy today. Procedure Code(s):     --- Professional ---                        [phone], Esophagogastroduodenoscopy, flexible,                         transoral; with biopsy, single or multiple Diagnosis Code(s):     --- Professional ---                        K21.00, Gastro-esophageal reflux disease with                         esophagitis, without bleeding                        K29.70, Gastritis, unspecified, without bleeding CPT copyright 2019 American Medical Association. All rights reserved.  The codes documented in this report are preliminary and upon coder review may  be revised to meet current compliance requirements. [name] MD, MD 03/06/2020 7:56:39 AM This report has been signed electronically. Number of Addenda: 0 Note Initiated On: 03/06/2020 7:36 AM Total Procedure Duration: 0 hours 3 minutes 28 seconds  Estimated Blood Loss:  Estimated blood loss: none.      [facility]

## 2020-03-06 NOTE — Op Note (Signed)
[facility] Patient Name: Ashley Glover Procedure Date: 03/06/2020 7:35 AM MRN: [id number] Account #: [id number] Date of Birth: 07-Dec-1960 Admit Type: Outpatient Age: 59 Room: [facility] OR ROOM 01 Gender: Female Note Status: Finalized Procedure:             Colonoscopy Indications:           High risk colon cancer surveillance: Personal history                         of colonic polyps Providers:             [name] MD, MD Referring MD:          Halina Maidens, MD (Referring MD) Medicines:             Propofol per Anesthesia Complications:         No immediate complications. Procedure:             Pre-Anesthesia Assessment:                        - Prior to the procedure, a History and Physical was                         performed, and patient medications and allergies were                         reviewed. The patient's tolerance of previous                         anesthesia was also reviewed. The risks and benefits                         of the procedure and the sedation options and risks                         were discussed with the patient. All questions were                         answered, and informed consent was obtained. Prior                         Anticoagulants: The patient has taken no previous                         anticoagulant or antiplatelet agents. ASA Grade                         Assessment: II - A patient with mild systemic disease.                         After reviewing the risks and benefits, the patient                         was deemed in satisfactory condition to undergo the                         procedure.  After obtaining informed consent, the colonoscope was                         passed under direct vision. Throughout the procedure,                         the patient's blood pressure, pulse, and oxygen                         saturations were monitored continuously. The was                          introduced through the anus and advanced to the the                         cecum, identified by appendiceal orifice and ileocecal                         valve. The colonoscopy was performed without                         difficulty. The patient tolerated the procedure well.                         The quality of the bowel preparation was excellent. Findings:      The perianal and digital rectal examinations were normal.      A 4 mm polyp was found in the sigmoid colon. The polyp was sessile. The       polyp was removed with a cold snare. Resection and retrieval were       complete.      Two sessile polyps were found in the transverse colon. The polyps were 3       to 4 mm in size. These polyps were removed with a cold snare. Resection       and retrieval were complete.      Two sessile polyps were found in the ascending colon. The polyps were 2       to 3 mm in size. These polyps were removed with a cold biopsy forceps.       Resection and retrieval were complete. Impression:            - One 4 mm polyp in the sigmoid colon, removed with a                         cold snare. Resected and retrieved.                        - Two 3 to 4 mm polyps in the transverse colon,                         removed with a cold snare. Resected and retrieved.                        - Two 2 to 3 mm polyps in the ascending colon, removed                         with a cold biopsy forceps. Resected and retrieved. Recommendation:        -  Discharge patient to home.                        - Resume previous diet.                        - Continue present medications.                        - Await pathology results.                        - Repeat colonoscopy in 3 years for surveillance. Procedure Code(s):     --- Professional ---                        [phone], Colonoscopy, flexible; with removal of                         tumor(s), polyp(s), or other lesion(s) by snare                          technique                        [id number], 45, Colonoscopy, flexible; with biopsy, single                         or multiple Diagnosis Code(s):     --- Professional ---                        Z86.010, Personal history of colonic polyps                        K63.5, Polyp of colon CPT copyright 2019 American Medical Association. All rights reserved. The codes documented in this report are preliminary and upon coder review may  be revised to meet current compliance requirements. [name] MD, MD 03/06/2020 8:20:07 AM This report has been signed electronically. Number of Addenda: 0 Note Initiated On: 03/06/2020 7:35 AM Scope Withdrawal Time: 0 hours 6 minutes 26 seconds  Total Procedure Duration: 0 hours 18 minutes 57 seconds  Estimated Blood Loss:  Estimated blood loss: none.      [facility]

## 2020-03-06 NOTE — Anesthesia Preprocedure Evaluation (Signed)
Anesthesia Evaluation  Patient identified by MRN, date of birth, ID band Patient awake    History of Anesthesia Complications Negative for: history of anesthetic complications  Airway Mallampati: III  TM Distance: >3 FB Neck ROM: Full    Dental no notable dental hx.    Pulmonary neg pulmonary ROS,    Pulmonary exam normal        Cardiovascular Exercise Tolerance: Good hypertension, Normal cardiovascular exam  Mild aortic insufficiency per echo   Neuro/Psych    GI/Hepatic Neg liver ROS, GERD  ,  Endo/Other    Renal/GU negative Renal ROS     Musculoskeletal  (+) Fibromyalgia -Sjogren's   Abdominal   Peds  Hematology   Anesthesia Other Findings   Reproductive/Obstetrics                             Anesthesia Physical Anesthesia Plan  ASA: II  Anesthesia Plan: General   Post-op Pain Management:    Induction: Intravenous  PONV Risk Score and Plan: 3 and Propofol infusion, TIVA and Treatment may vary due to age or medical condition  Airway Management Planned: Nasal Cannula and Natural Airway  Additional Equipment: None  Intra-op Plan:   Post-operative Plan:   Informed Consent: I have reviewed the patients History and Physical, chart, labs and discussed the procedure including the risks, benefits and alternatives for the proposed anesthesia with the patient or authorized representative who has indicated his/her understanding and acceptance.       Plan Discussed with: CRNA  Anesthesia Plan Comments:         Anesthesia Quick Evaluation

## 2020-03-06 NOTE — Anesthesia Postprocedure Evaluation (Signed)
Anesthesia Post Note  Patient: DEANN MCLAINE  Procedure(s) Performed: COLONOSCOPY WITH BIOPSY (N/A Rectum) ESOPHAGOGASTRODUODENOSCOPY (EGD) WITH BIOPSY (N/A Mouth) POLYPECTOMY (N/A Rectum)     Patient location during evaluation: PACU Anesthesia Type: General Level of consciousness: awake and alert Pain management: pain level controlled Vital Signs Assessment: post-procedure vital signs reviewed and stable Respiratory status: spontaneous breathing, nonlabored ventilation, respiratory function stable and patient connected to nasal cannula oxygen Cardiovascular status: blood pressure returned to baseline and stable Postop Assessment: no apparent nausea or vomiting Anesthetic complications: no   No complications documented.  Adele Barthel Pritesh Sobecki

## 2020-03-06 NOTE — Transfer of Care (Signed)
Immediate Anesthesia Transfer of Care Note  Patient: Ashley Glover  Procedure(s) Performed: COLONOSCOPY WITH BIOPSY (N/A Rectum) ESOPHAGOGASTRODUODENOSCOPY (EGD) WITH BIOPSY (N/A Mouth) POLYPECTOMY (N/A Rectum)  Patient Location: PACU  Anesthesia Type: General  Level of Consciousness: awake, alert  and patient cooperative  Airway and Oxygen Therapy: Patient Spontanous Breathing and Patient connected to supplemental oxygen  Post-op Assessment: Post-op Vital signs reviewed, Patient's Cardiovascular Status Stable, Respiratory Function Stable, Patent Airway and No signs of Nausea or vomiting  Post-op Vital Signs: Reviewed and stable  Complications: No complications documented.

## 2020-03-07 ENCOUNTER — Encounter: Payer: Self-pay | Admitting: Gastroenterology

## 2020-03-08 LAB — SURGICAL PATHOLOGY

## 2020-03-12 ENCOUNTER — Encounter: Payer: Self-pay | Admitting: Gastroenterology

## 2020-03-15 ENCOUNTER — Ambulatory Visit
Admission: RE | Admit: 2020-03-15 | Discharge: 2020-03-15 | Disposition: A | Discharge status: Home / Self Care | Payer: Medicare Other | Source: Ambulatory Visit | Attending: Internal Medicine | Admitting: Internal Medicine

## 2020-03-15 ENCOUNTER — Other Ambulatory Visit: Payer: Self-pay

## 2020-03-15 DIAGNOSIS — Z1231 Encounter for screening mammogram for malignant neoplasm of breast: Secondary | ICD-10-CM | POA: Insufficient documentation

## 2020-03-16 ENCOUNTER — Encounter: Payer: Self-pay | Admitting: Internal Medicine

## 2020-03-16 DIAGNOSIS — Z90710 Acquired absence of both cervix and uterus: Secondary | ICD-10-CM | POA: Insufficient documentation

## 2020-03-21 ENCOUNTER — Encounter: Payer: Self-pay | Admitting: Internal Medicine

## 2020-03-22 ENCOUNTER — Other Ambulatory Visit: Payer: Self-pay | Admitting: Internal Medicine

## 2020-03-22 DIAGNOSIS — I10 Essential (primary) hypertension: Secondary | ICD-10-CM

## 2020-03-22 DIAGNOSIS — M797 Fibromyalgia: Secondary | ICD-10-CM

## 2020-03-22 NOTE — Telephone Encounter (Signed)
Requested medication (s) are due for refill today: Hydrocortisone 2.5% cream, yes  Requested medication (s) are on the active medication list: yes  Last refill: 12/06/19  Future visit scheduled: yes  Notes to clinic: no assigned protocol  Requested medication (s) are due for refill today: Tizanidine, yes  Requested medication (s) are on the active medication list: yes  Last refill:  12/06/19  Future visit scheduled: yes  Notes to clinic:  not delegated  Requested Prescriptions  Pending Prescriptions Disp Refills   hydrocortisone 2.5 % cream [Pharmacy Med Name: HYDROCORTISONE  2.5%  CRE] 90 g 0    Sig: APPLY TOPICALLY TWO TIMES   DAILY      Off-Protocol Failed - 03/22/2020  9:04 AM      Failed - Medication not assigned to a protocol, review manually.      Passed - Valid encounter within last 12 months    Recent Outpatient Visits           3 weeks ago Cellulitis of right upper extremity   [facility] Glean Hess, MD   1 month ago Hyperlipidemia, mixed   [facility] Glean Hess, MD   9 months ago Annual physical exam   [facility] Glean Hess, MD   1 year ago Essential hypertension   [facility] Glean Hess, MD   1 year ago Annual physical exam   [facility] Glean Hess, MD       Future Appointments             In 3 months Army Melia Jesse Sans, MD [facility], [facility]              tiZANidine (ZANAFLEX) 4 MG tablet [Pharmacy Med Name: TIZANIDINE  4MG   TAB] 270 tablet 3    Sig: TAKE 1 TABLET BY MOUTH 3  TIMES DAILY      Not Delegated - Cardiovascular:  Alpha-2 Agonists - tizanidine Failed - 03/22/2020  9:04 AM      Failed - This refill cannot be delegated      Passed - Valid encounter within last 6 months    Recent Outpatient Visits           3 weeks ago Cellulitis of right upper extremity   [facility] Glean Hess, MD   1 month ago Hyperlipidemia, mixed    [facility] Glean Hess, MD   9 months ago Annual physical exam   [facility] Glean Hess, MD   1 year ago Essential hypertension   [facility] Glean Hess, MD   1 year ago Annual physical exam   [facility] Glean Hess, MD       Future Appointments             In 3 months Army Melia Jesse Sans, MD [facility], [facility]

## 2020-05-06 ENCOUNTER — Other Ambulatory Visit: Payer: Self-pay | Admitting: Internal Medicine

## 2020-05-06 DIAGNOSIS — M797 Fibromyalgia: Secondary | ICD-10-CM

## 2020-05-06 NOTE — Telephone Encounter (Signed)
Requested medication (s) are due for refill today: yes  Requested medication (s) are on the active medication list: yes  Last refill:  Zanaflex: 05/04/19       hydrocortisone cream: 03/22/20  Future visit scheduled: yes  Notes to clinic:  Zanaflex not delegated to NT to RF  Hydrocortisone: not assigned to a protocol   Requested Prescriptions  Pending Prescriptions Disp Refills   tiZANidine (ZANAFLEX) 4 MG tablet [Pharmacy Med Name: TIZANIDINE  4MG   TAB] 270 tablet 3    Sig: TAKE 1 TABLET BY MOUTH 3  TIMES DAILY      Not Delegated - Cardiovascular:  Alpha-2 Agonists - tizanidine Failed - 05/06/2020 12:04 PM      Failed - This refill cannot be delegated      Passed - Valid encounter within last 6 months    Recent Outpatient Visits           2 months ago Cellulitis of right upper extremity   [facility] Glean Hess, MD   3 months ago Hyperlipidemia, mixed   [facility] Glean Hess, MD   10 months ago Annual physical exam   [facility] Glean Hess, MD   1 year ago Essential hypertension   [facility] Glean Hess, MD   1 year ago Annual physical exam   [facility] Glean Hess, MD       Future Appointments             In 2 months Glean Hess, MD [facility], [facility]               hydrocortisone 2.5 % cream [Pharmacy Med Name: HYDROCORTISONE  2.5%  CRE] 90 g 0    Sig: APPLY TOPICALLY TWO TIMES   DAILY      Off-Protocol Failed - 05/06/2020 12:04 PM      Failed - Medication not assigned to a protocol, review manually.      Passed - Valid encounter within last 12 months    Recent Outpatient Visits           2 months ago Cellulitis of right upper extremity   [facility] Glean Hess, MD   3 months ago Hyperlipidemia, mixed   [facility] Glean Hess, MD   10 months ago Annual physical exam   [facility] Glean Hess, MD   1 year  ago Essential hypertension   [facility] Glean Hess, MD   1 year ago Annual physical exam   [facility] Glean Hess, MD       Future Appointments             In 2 months Army Melia Jesse Sans, MD [facility], [facility]

## 2020-05-08 DIAGNOSIS — M7552 Bursitis of left shoulder: Secondary | ICD-10-CM | POA: Diagnosis not present

## 2020-05-08 DIAGNOSIS — M1712 Unilateral primary osteoarthritis, left knee: Secondary | ICD-10-CM | POA: Diagnosis not present

## 2020-05-24 ENCOUNTER — Other Ambulatory Visit: Payer: Self-pay | Admitting: Internal Medicine

## 2020-05-24 DIAGNOSIS — G43C0 Periodic headache syndromes in child or adult, not intractable: Secondary | ICD-10-CM

## 2020-05-24 DIAGNOSIS — G43009 Migraine without aura, not intractable, without status migrainosus: Secondary | ICD-10-CM

## 2020-05-24 NOTE — Telephone Encounter (Signed)
Requested medication (s) are due for refill today: yes  Requested medication (s) are on the active medication list: yes  Last refill:  05/08/20  Future visit scheduled: yes  Notes to clinic:  Please review for refill. Unable to refill per protocol. Pharmacy requesting a 90 day supply    Requested Prescriptions  Pending Prescriptions Disp Refills   hydrocortisone 2.5 % cream 90 g 0    Sig: APPLY TOPICALLY TWO TIMES   DAILY      Off-Protocol Failed - 05/24/2020  1:08 PM      Failed - Medication not assigned to a protocol, review manually.      Passed - Valid encounter within last 12 months    Recent Outpatient Visits           2 months ago Cellulitis of right upper extremity   [facility] Reubin Milan, MD   4 months ago Hyperlipidemia, mixed   [facility] Reubin Milan, MD   11 months ago Annual physical exam   [facility] Reubin Milan, MD   1 year ago Essential hypertension   [facility] Reubin Milan, MD   1 year ago Annual physical exam   [facility] Reubin Milan, MD       Future Appointments             In 1 month Judithann Graves Nyoka Cowden, MD [facility], [facility]

## 2020-05-24 NOTE — Telephone Encounter (Signed)
Medication Refill - Medication: hydrocortisone 2.5 % cream (Phamracy is requesting a 90 day supply be sent over)   Has the patient contacted their pharmacy? yes (Agent: If no, request that the patient contact the pharmacy for the refill.) (Agent: If yes, when and what did the pharmacy advise?)Contact PCP  Preferred Pharmacy (with phone number or street name):  [facility] - [place], [place] - [address] [place], [address] Phone:  [phone]  Fax:  [phone]       Agent: Please be advised that RX refills may take up to 3 business days. We ask that you follow-up with your pharmacy.

## 2020-06-11 ENCOUNTER — Other Ambulatory Visit: Payer: Self-pay

## 2020-06-11 DIAGNOSIS — Z79899 Other long term (current) drug therapy: Secondary | ICD-10-CM | POA: Insufficient documentation

## 2020-06-11 DIAGNOSIS — Z96651 Presence of right artificial knee joint: Secondary | ICD-10-CM | POA: Diagnosis not present

## 2020-06-11 DIAGNOSIS — R220 Localized swelling, mass and lump, head: Secondary | ICD-10-CM | POA: Diagnosis not present

## 2020-06-11 DIAGNOSIS — Z743 Need for continuous supervision: Secondary | ICD-10-CM | POA: Diagnosis not present

## 2020-06-11 DIAGNOSIS — R509 Fever, unspecified: Secondary | ICD-10-CM | POA: Diagnosis not present

## 2020-06-11 DIAGNOSIS — Z7982 Long term (current) use of aspirin: Secondary | ICD-10-CM | POA: Diagnosis not present

## 2020-06-11 DIAGNOSIS — R52 Pain, unspecified: Secondary | ICD-10-CM | POA: Diagnosis not present

## 2020-06-11 DIAGNOSIS — Z86010 Personal history of colonic polyps: Secondary | ICD-10-CM | POA: Diagnosis not present

## 2020-06-11 DIAGNOSIS — L03211 Cellulitis of face: Secondary | ICD-10-CM | POA: Diagnosis not present

## 2020-06-11 DIAGNOSIS — R6889 Other general symptoms and signs: Secondary | ICD-10-CM | POA: Diagnosis not present

## 2020-06-11 DIAGNOSIS — I10 Essential (primary) hypertension: Secondary | ICD-10-CM | POA: Insufficient documentation

## 2020-06-11 DIAGNOSIS — J341 Cyst and mucocele of nose and nasal sinus: Secondary | ICD-10-CM | POA: Diagnosis not present

## 2020-06-11 DIAGNOSIS — J0100 Acute maxillary sinusitis, unspecified: Secondary | ICD-10-CM | POA: Diagnosis not present

## 2020-06-11 DIAGNOSIS — E039 Hypothyroidism, unspecified: Secondary | ICD-10-CM | POA: Diagnosis not present

## 2020-06-11 DIAGNOSIS — J320 Chronic maxillary sinusitis: Secondary | ICD-10-CM | POA: Diagnosis not present

## 2020-06-11 LAB — CBC WITH DIFFERENTIAL/PLATELET
Abs Immature Granulocytes: 0.06 10*3/uL (ref 0.00–0.07)
Basophils Absolute: 0 10*3/uL (ref 0.0–0.1)
Basophils Relative: 0 %
Eosinophils Absolute: 0.2 10*3/uL (ref 0.0–0.5)
Eosinophils Relative: 1 %
HCT: 45.9 % (ref 36.0–46.0)
Hemoglobin: 14.5 g/dL (ref 12.0–15.0)
Immature Granulocytes: 0 %
Lymphocytes Relative: 10 %
Lymphs Abs: 1.6 10*3/uL (ref 0.7–4.0)
MCH: 29.1 pg (ref 26.0–34.0)
MCHC: 31.6 g/dL (ref 30.0–36.0)
MCV: 92.2 fL (ref 80.0–100.0)
Monocytes Absolute: 1.3 10*3/uL — ABNORMAL HIGH (ref 0.1–1.0)
Monocytes Relative: 8 %
Neutro Abs: 12.4 10*3/uL — ABNORMAL HIGH (ref 1.7–7.7)
Neutrophils Relative %: 81 %
Platelets: 260 10*3/uL (ref 150–400)
RBC: 4.98 MIL/uL (ref 3.87–5.11)
RDW: 13.9 % (ref 11.5–15.5)
WBC: 15.6 10*3/uL — ABNORMAL HIGH (ref 4.0–10.5)
nRBC: 0 % (ref 0.0–0.2)

## 2020-06-11 LAB — LACTIC ACID, PLASMA: Lactic Acid, Venous: 1.5 mmol/L (ref 0.5–1.9)

## 2020-06-11 LAB — COMPREHENSIVE METABOLIC PANEL
ALT: 26 U/L (ref 0–44)
AST: 23 U/L (ref 15–41)
Albumin: 4 g/dL (ref 3.5–5.0)
Alkaline Phosphatase: 74 U/L (ref 38–126)
Anion gap: 11 (ref 5–15)
BUN: 7 mg/dL — ABNORMAL LOW (ref 8–23)
CO2: 31 mmol/L (ref 22–32)
Calcium: 9.2 mg/dL (ref 8.9–10.3)
Chloride: 98 mmol/L (ref 98–111)
Creatinine, Ser: 0.79 mg/dL (ref 0.44–1.00)
GFR, Estimated: >60 mL/min (ref >60)
Glucose, Bld: 128 mg/dL — ABNORMAL HIGH (ref 70–99)
Potassium: 3.1 mmol/L — ABNORMAL LOW (ref 3.5–5.1)
Sodium: 140 mmol/L (ref 135–145)
Total Bilirubin: 0.5 mg/dL (ref 0.3–1.2)
Total Protein: 7.8 g/dL (ref 6.5–8.1)

## 2020-06-11 NOTE — ED Triage Notes (Signed)
Sever right sided facial swelling that has been progressing since last night. States she knew that she had a "pus pocket" to upper right tooth/gum area. Able to swallow and speak without difficulty. Hypertensive with EMS

## 2020-06-12 ENCOUNTER — Emergency Department: Payer: Medicare Other

## 2020-06-12 ENCOUNTER — Emergency Department
Admission: EM | Admit: 2020-06-12 | Discharge: 2020-06-12 | Disposition: A | Discharge status: Home / Self Care | Payer: Medicare Other | Attending: Emergency Medicine | Admitting: Emergency Medicine

## 2020-06-12 DIAGNOSIS — L03211 Cellulitis of face: Secondary | ICD-10-CM

## 2020-06-12 DIAGNOSIS — J320 Chronic maxillary sinusitis: Secondary | ICD-10-CM | POA: Diagnosis not present

## 2020-06-12 DIAGNOSIS — R220 Localized swelling, mass and lump, head: Secondary | ICD-10-CM | POA: Diagnosis not present

## 2020-06-12 DIAGNOSIS — J0100 Acute maxillary sinusitis, unspecified: Secondary | ICD-10-CM | POA: Diagnosis not present

## 2020-06-12 DIAGNOSIS — J341 Cyst and mucocele of nose and nasal sinus: Secondary | ICD-10-CM | POA: Diagnosis not present

## 2020-06-12 MED ORDER — PROBIOTIC 250 MG PO CAPS: 1.0000 | ORAL_CAPSULE | Freq: Every day | ORAL | 0 refills | Status: DC

## 2020-06-12 MED ORDER — IOHEXOL 300 MG/ML  SOLN
75.0000 mL | Freq: Once | INTRAMUSCULAR | Status: AC | PRN
  Administered 2020-06-12: 75 mL via INTRAVENOUS

## 2020-06-12 MED ORDER — FENTANYL CITRATE (PF) 100 MCG/2ML IJ SOLN
50.0000 ug | Freq: Once | INTRAMUSCULAR | Status: AC
  Administered 2020-06-12: 50 ug via INTRAVENOUS
  Filled 2020-06-12: qty 2

## 2020-06-12 MED ORDER — ONDANSETRON HCL 4 MG/2ML IJ SOLN
4.0000 mg | Freq: Once | INTRAMUSCULAR | Status: AC
  Administered 2020-06-12: 4 mg via INTRAVENOUS
  Filled 2020-06-12: qty 2

## 2020-06-12 MED ORDER — OXYCODONE-ACETAMINOPHEN 5-325 MG PO TABS: 1.0000 | ORAL_TABLET | ORAL | 0 refills | Status: DC | PRN

## 2020-06-12 MED ORDER — CLINDAMYCIN HCL 300 MG PO CAPS: 300.0000 mg | ORAL_CAPSULE | Freq: Three times a day (TID) | ORAL | 0 refills | Status: AC

## 2020-06-12 MED ORDER — ONDANSETRON 4 MG PO TBDP: 4.0000 mg | ORAL_TABLET | Freq: Four times a day (QID) | ORAL | 0 refills | Status: DC | PRN

## 2020-06-12 MED ORDER — KETOROLAC TROMETHAMINE 30 MG/ML IJ SOLN
30.0000 mg | Freq: Once | INTRAMUSCULAR | Status: AC
  Administered 2020-06-12: 30 mg via INTRAVENOUS
  Filled 2020-06-12: qty 1

## 2020-06-12 MED ORDER — CLINDAMYCIN PHOSPHATE 600 MG/50ML IV SOLN
600.0000 mg | Freq: Once | INTRAVENOUS | Status: AC
  Administered 2020-06-12: 600 mg via INTRAVENOUS
  Filled 2020-06-12 (×2): qty 50

## 2020-06-12 NOTE — Discharge Instructions (Addendum)
You may alternate Tylenol 1000 mg every 6 hours as needed for pain, fever and Ibuprofen 800 mg every 8 hours as needed for pain, fever.  Please take Ibuprofen with food.  Do not take more than 4000 mg of Tylenol (acetaminophen) in a 24 hour period.  Please take your antibiotics until complete.  I recommend taking a probiotic while on clindamycin.  I recommend close follow-up with a dentist.  If your swelling is worsening, you have difficulty speaking or swallowing or breathing, vomiting and cannot stop, please return to the emergency department.  You are being provided a prescription for opiates (also known as narcotics) for pain control.  Opiates can be addictive and should only be used when absolutely necessary for pain control when other alternatives do not work.  We recommend you only use them for the recommended amount of time and only as prescribed.  Please do not take with other sedative medications or alcohol.  Please do not drive, operate machinery, make important decisions while taking opiates.  Please note that these medications can be addictive and have high abuse potential.  Patients can become addicted to narcotics after only taking them for a few days.  Please keep these medications locked away from children, teenagers or any family members with history of substance abuse.  Narcotic pain medicine may also make you constipated.  You may use over-the-counter medications such as MiraLAX, Colace to prevent constipation.  If you become constipated you may use over-the-counter enemas as needed.  Itching and nausea are common side effects of narcotic pain medication.  If you develop uncontrolled vomiting or a rash, please stop these medications.

## 2020-06-12 NOTE — ED Provider Notes (Signed)
[facility] Emergency Department Provider Note  ____________________________________________   Event Date/Time   First MD Initiated Contact with Patient 06/12/20 0217     (approximate)  I have reviewed the triage vital signs and the nursing notes.   HISTORY  Chief Complaint Facial Swelling    HPI Ashley Glover is a 60 y.o. female with history of Sjogren's, hypertension, hyperlipidemia, hypothyroidism who presents to the emergency department with complaints of facial swelling that started last night.  Had similar episode several weeks ago and pulled a piece of chicken out from around one of her front teeth and this improve the facial swelling.  She states that she was concerned she could have a dental abscess.  No drainage of pus.  She states she started taking leftover doxycycline that she had without relief.  No fevers, chills, difficulty swallowing or speaking or breathing, vomiting.  She is not a diabetic.  Received pain medication in triage and states she is feeling much better.  Came in by EMS.        Past Medical History:  Diagnosis Date  . Allergic rhinitis   . Benign neoplasm of ascending colon   . Benign neoplasm of cecum   . Degenerative joint disease   . Fibromyalgia   . GERD (gastroesophageal reflux disease)   . Hx of abnormal cervical Pap smear 08/02/2015   1990's - had partial hysterectomy for dysplasia and HPV   . Hyperlipidemia   . Hypertension   . Hypothyroid   . Mitral valve prolapse    states it is mild and does not see a cardiologist  . Redness of both eyes 09/30/2017  . Sjogrens syndrome [facility])     Patient Active Problem List   Diagnosis Date Noted  . History of hysterectomy 03/16/2020  . Acute gastritis without hemorrhage   . Dermatitis of both ear canals 03/01/2020  . Pruritus 01/25/2020  . Gastroesophageal reflux disease with esophagitis without hemorrhage   . Personal history of colonic polyps   . Polyp of ascending  colon   . Insomnia 06/25/2019  . Osteopenia determined by x-ray 06/23/2018  . Chronic otitis media 09/30/2017  . Migraine without aura and without status migrainosus, not intractable 05/21/2017  . Not currently working due to disabled status 01/24/2017  . Degenerative arthritis 01/24/2017  . Frequent PVCs 08/20/2016  . Hx of colonic polyps 04/24/2016  . Periodic headache syndrome, not intractable 01/31/2016  . Rosacea, acne 01/31/2016  . Essential hypertension 08/02/2015  . Hypothyroidism due to acquired atrophy of thyroid 08/02/2015  . Fibromyalgia 08/02/2015  . Sjogren's syndrome ([place]) 08/02/2015  . Peptic ulcer disease 08/02/2015  . Primary osteoarthritis of both knees 08/02/2015  . Osteopenia 08/02/2015  . Other allergic rhinitis 08/02/2015  . Hyperlipidemia, mixed 08/02/2015    Past Surgical History:  Procedure Laterality Date  . AUGMENTATION MAMMAPLASTY Bilateral 1987  . COLONOSCOPY  05/2012   polyps  . COLONOSCOPY WITH PROPOFOL N/A 06/20/2016   Procedure: COLONOSCOPY WITH PROPOFOL;  Surgeon: [name], MD;  Location: [place];  Service: Endoscopy;  Laterality: N/A;  . COLONOSCOPY WITH PROPOFOL N/A 11/11/2019   Procedure: COLONOSCOPY WITH PROPOFOL;  Surgeon: [name], MD;  Location: [place];  Service: Endoscopy;  Laterality: N/A;  3  . COLONOSCOPY WITH PROPOFOL N/A 03/06/2020   Procedure: COLONOSCOPY WITH BIOPSY;  Surgeon: [name], MD;  Location: [place];  Service: Endoscopy;  Laterality: N/A;  . ESOPHAGOGASTRODUODENOSCOPY (EGD) WITH PROPOFOL N/A 06/20/2016  Procedure: ESOPHAGOGASTRODUODENOSCOPY (EGD) WITH PROPOFOL;  Surgeon: Midge Minium, MD;  Location: [facility];  Service: Endoscopy;  Laterality: N/A;  . ESOPHAGOGASTRODUODENOSCOPY (EGD) WITH PROPOFOL N/A 11/11/2019   Procedure: ESOPHAGOGASTRODUODENOSCOPY (EGD) WITH PROPOFOL;  Surgeon: Midge Minium, MD;  Location: [facility];  Service: Endoscopy;  Laterality: N/A;   . ESOPHAGOGASTRODUODENOSCOPY (EGD) WITH PROPOFOL N/A 03/06/2020   Procedure: ESOPHAGOGASTRODUODENOSCOPY (EGD) WITH BIOPSY;  Surgeon: Midge Minium, MD;  Location: [facility];  Service: Endoscopy;  Laterality: N/A;  . FOOT SURGERY Right   . GANGLION CYST EXCISION Left   . NASAL SINUS SURGERY    . NEUROMA SURGERY Left   . PARTIAL HYSTERECTOMY  1996   +HPV and cervical dysplasia  . PLACEMENT OF BREAST IMPLANTS    . POLYPECTOMY  06/20/2016   Procedure: POLYPECTOMY;  Surgeon: Midge Minium, MD;  Location: [facility];  Service: Endoscopy;;  . POLYPECTOMY N/A 03/06/2020   Procedure: POLYPECTOMY;  Surgeon: Midge Minium, MD;  Location: [facility];  Service: Endoscopy;  Laterality: N/A;  . REPLACEMENT TOTAL KNEE Right 06/2014  . TONSILLECTOMY      Prior to Admission medications   Medication Sig Start Date End Date Taking? Authorizing Provider  albuterol (VENTOLIN HFA) 108 (90 Base) MCG/ACT inhaler Inhale 2 puffs into the lungs every 6 (six) hours as needed for wheezing or shortness of breath. 12/22/18   Reubin Milan, MD  aspirin (ASPIRIN 81) 81 MG EC tablet Take 81 mg by mouth daily. Swallow whole.    [provider]  butalbital-acetaminophen-caffeine (FIORICET WITH CODEINE) 50-325-40-30 MG capsule TAKE 1 CAPSULE BY MOUTH EVERY 6 (SIX) HOURS AS NEEDED FOR HEADACHE. 05/24/20   Reubin Milan, MD  calcium-vitamin D (OSCAL WITH D) 250-125 MG-UNIT tablet Take 1 tablet by mouth daily.    [provider]  DULoxetine (CYMBALTA) 60 MG capsule TAKE 1 CAPSULE BY MOUTH  DAILY 02/06/20   Reubin Milan, MD  furosemide (LASIX) 40 MG tablet Take 1 tablet (40 mg total) by mouth daily. 05/12/17   Reubin Milan, MD  gabapentin (NEURONTIN) 600 MG tablet TAKE 1 TABLET BY MOUTH 3  TIMES DAILY 02/06/20   Reubin Milan, MD  hydrochlorothiazide (HYDRODIURIL) 25 MG tablet Take 12.5 mg by mouth daily.    [provider]  hydrocortisone 2.5 % cream APPLY  TOPICALLY TWO TIMES   DAILY 05/08/20   Reubin Milan, MD  levothyroxine (SYNTHROID) 88 MCG tablet TAKE 1 TABLET BY MOUTH  DAILY BEFORE BREAKFAST 02/06/20   Reubin Milan, MD  loratadine (CLARITIN) 10 MG tablet Take 10 mg by mouth daily.    [provider]  mometasone (ELOCON) 0.1 % lotion Apply topically daily. To both ears as needed 03/01/20   Reubin Milan, MD  montelukast (SINGULAIR) 10 MG tablet TAKE 1 TABLET BY MOUTH AT  BEDTIME 02/06/20   Reubin Milan, MD  Multiple Vitamin (MULTIVITAMIN ADULT PO) Take 1 tablet by mouth daily.    [provider]  Omega-3 Fatty Acids (FISH OIL) 1000 MG CAPS Take by mouth.    [provider]  pantoprazole (PROTONIX) 40 MG tablet TAKE 1 TABLET BY MOUTH  TWICE DAILY 02/07/20   Midge Minium, MD  promethazine (PHENERGAN) 25 MG tablet TAKE 1 TABLET (25 MG TOTAL) BY MOUTH EVERY 8 (EIGHT) HOURS AS NEEDED FOR NAUSEA OR VOMITING. 05/24/20   Reubin Milan, MD  propranolol (INDERAL) 40 MG tablet TAKE 1 TABLET BY MOUTH  TWICE DAILY 03/22/20  Glean Hess, MD  rosuvastatin (CRESTOR) 20 MG tablet Take 20 mg by mouth at bedtime. 12/06/19   [provider]  tiZANidine (ZANAFLEX) 4 MG tablet TAKE 1 TABLET BY MOUTH 3  TIMES DAILY 05/08/20   Glean Hess, MD  tobramycin-dexamethasone [facility]) ophthalmic solution Place 1 drop into both eyes every 6 (six) hours. 12/22/18   Glean Hess, MD  zolpidem (AMBIEN CR) 6.25 MG CR tablet Take 1 tablet (6.25 mg total) by mouth at bedtime as needed for sleep. 06/28/19   Glean Hess, MD    Allergies Ace inhibitors, Morphine and related, and Savella [milnacipran hcl]  Family History  Problem Relation Age of Onset  . Pulmonary embolism Mother   . Hypertension Mother   . AVM Father   . Hypertension Father   . Hypothyroidism Father   . Breast cancer Neg Hx     Social History Social History   Tobacco Use  . Smoking status: Never Smoker  . Smokeless tobacco:  Never Used  Vaping Use  . Vaping Use: Never used  Substance Use Topics  . Alcohol use: No    Alcohol/week: 0.0 standard drinks  . Drug use: No    Review of Systems Constitutional: No fever. Eyes: No visual changes. ENT: No sore throat. Cardiovascular: Denies chest pain. Respiratory: Denies shortness of breath. Gastrointestinal: No nausea, vomiting, diarrhea. Genitourinary: Negative for dysuria. Musculoskeletal: Negative for back pain. Skin: Negative for rash. Neurological: Negative for focal weakness or numbness.  ____________________________________________   PHYSICAL EXAM:  VITAL SIGNS: ED Triage Vitals  Enc Vitals Group     BP 06/11/20 2101 (!) 141/92     Pulse Rate 06/11/20 2101 (!) 106     Resp 06/11/20 2101 18     Temp 06/11/20 2101 99.4 F (37.4 C)     Temp Source 06/11/20 2101 Oral     SpO2 06/11/20 2101 97 %     Weight 06/11/20 2056 190 lb (86.2 kg)     Height 06/11/20 2056 5\' 5"  (1.651 m)     Head Circumference --      Peak Flow --      Pain Score 06/11/20 2055 8     Pain Loc --      Pain Edu? --      Excl. in [place]? --    CONSTITUTIONAL: Alert and oriented and responds appropriately to questions. Well-appearing; well-nourished HEAD: Normocephalic EYES: Conjunctivae clear, pupils appear equal, EOM appear intact ENT: normal nose; moist mucous membranes, patient has significant right-sided facial swelling without redness or warmth, no induration or abscess, no dental caries noted and no obvious dental abscess, no Ludwig's angina, normal phonation, no trismus or drooling NECK: Supple, normal ROM CARD: Regular and minimally tachycardic; S1 and S2 appreciated; no murmurs, no clicks, no rubs, no gallops RESP: Normal chest excursion without splinting or tachypnea; breath sounds clear and equal bilaterally; no wheezes, no rhonchi, no rales, no hypoxia or respiratory distress, speaking full sentences ABD/GI: Normal bowel sounds; non-distended; soft, non-tender, no  rebound, no guarding, no peritoneal signs, no hepatosplenomegaly BACK: The back appears normal EXT: Normal ROM in all joints; no deformity noted, no edema; no cyanosis SKIN: Normal color for age and race; warm; no rash on exposed skin NEURO: Moves all extremities equally PSYCH: The patient's mood and manner are appropriate.  ____________________________________________   LABS (all labs ordered are listed, but only abnormal results are displayed)  Labs Reviewed  COMPREHENSIVE METABOLIC PANEL - Abnormal; Notable for the  following components:      Result Value   Potassium 3.1 (*)    Glucose, Bld 128 (*)    BUN 7 (*)    All other components within normal limits  CBC WITH DIFFERENTIAL/PLATELET - Abnormal; Notable for the following components:   WBC 15.6 (*)    Neutro Abs 12.4 (*)    Monocytes Absolute 1.3 (*)    All other components within normal limits  LACTIC ACID, PLASMA   ____________________________________________  EKG  none ____________________________________________  RADIOLOGY I, Ogechi Kuehnel, personally viewed and evaluated these images (plain radiographs) as part of my medical decision making, as well as reviewing the written report by the radiologist.  ED MD interpretation: CT scan shows right-sided facial cellulitis without abscess.  Official radiology report(s): CT Maxillofacial W Contrast  Result Date: 06/12/2020 CLINICAL DATA:  Right-sided facial swelling EXAM: CT MAXILLOFACIAL WITH CONTRAST TECHNIQUE: Multidetector CT imaging of the maxillofacial structures was performed with intravenous contrast. Multiplanar CT image reconstructions were also generated. CONTRAST:  44mL OMNIPAQUE IOHEXOL 300 MG/ML  SOLN COMPARISON:  None. FINDINGS: Osseous: There are periapical lucencies of teeth 4 and 5. The root of tooth 4 extends in the right maxillary sinus. There is mild overlying soft tissue edema. No visible fluid collection, though the area is partially obscured by streak  artifact from dental amalgam. Orbits: Normal Sinuses: Left maxillary retention cyst. Right maxillary odontogenic sinusitis. Soft tissues: Soft tissue edema/inflammation superficial to the right maxilla. Limited intracranial: Unremarkable IMPRESSION: Right facial swelling, likely odontogenic cellulitis arising from the roots of teeth 4/5. No visible abscess, but the area is partially obscured by streak artifacts from dental amalgam. Electronically Signed   By: Ulyses Jarred M.D.   On: 06/12/2020 01:57    ____________________________________________   PROCEDURES  Procedure(s) performed (including Critical Care):  Procedures   ____________________________________________   INITIAL IMPRESSION / ASSESSMENT AND PLAN / ED COURSE  As part of my medical decision making, I reviewed the following data within the [place] notes reviewed and incorporated, Labs reviewed and shows leukocytosis, Notes from prior ED visits and [place] Controlled Substance Database         Patient here with facial cellulitis.  CT of the face obtained with contrast that shows no abscess but cellulitis likely odontogenic in nature.  She does not have a local dentist.  She reports feeling much better after fentanyl in the waiting room.  She has no systemic symptoms and is extremely well-appearing in nature.  Posterior oropharynx is patent and she has no difficulty breathing, speaking or talking.  No signs of Ludwig's angina.  Plan is to give dose of Toradol, clindamycin here and discharged with clindamycin and outpatient dental follow-up.  Will provide with pain medication for home.  Discussed return precautions.  She is comfortable with this plan.    At this time, I do not feel there is any life-threatening condition present. I have reviewed, interpreted and discussed all results (EKG, imaging, lab, urine as appropriate) and exam findings with patient/family. I have reviewed nursing notes and appropriate  previous records.  I feel the patient is safe to be discharged home without further emergent workup and can continue workup as an outpatient as needed. Discussed usual and customary return precautions. Patient/family verbalize understanding and are comfortable with this plan.  Outpatient follow-up has been provided as needed. All questions have been answered.    ____________________________________________   FINAL CLINICAL IMPRESSION(S) / ED DIAGNOSES  Final diagnoses:  Facial cellulitis  ED Discharge Orders         Ordered    clindamycin (CLEOCIN) 300 MG capsule  3 times daily        06/12/20 0238    Saccharomyces boulardii (PROBIOTIC) 250 MG CAPS  Daily        06/12/20 0238    oxyCODONE-acetaminophen (PERCOCET/ROXICET) 5-325 MG tablet  Every 4 hours PRN        06/12/20 0238    ondansetron (ZOFRAN ODT) 4 MG disintegrating tablet  Every 6 hours PRN        06/12/20 0238          *Please note:  HUNTLEIGH DOOLEN was evaluated in Emergency Department on 06/12/2020 for the symptoms described in the history of present illness. She was evaluated in the context of the global COVID-19 pandemic, which necessitated consideration that the patient might be at risk for infection with the SARS-CoV-2 virus that causes COVID-19. Institutional protocols and algorithms that pertain to the evaluation of patients at risk for COVID-19 are in a state of rapid change based on information released by regulatory bodies including the CDC and federal and state organizations. These policies and algorithms were followed during the patient's care in the ED.  Some ED evaluations and interventions may be delayed as a result of limited staffing during and the pandemic.*   Note:  This document was prepared using Dragon voice recognition software and may include unintentional dictation errors.   Jaedah Lords, Delice Bison, DO 06/12/20 [phone]

## 2020-06-21 ENCOUNTER — Ambulatory Visit: Payer: Medicare Other

## 2020-06-26 ENCOUNTER — Other Ambulatory Visit: Payer: Self-pay | Admitting: Internal Medicine

## 2020-06-26 ENCOUNTER — Ambulatory Visit: Payer: Medicare Other

## 2020-06-26 DIAGNOSIS — I10 Essential (primary) hypertension: Secondary | ICD-10-CM

## 2020-06-28 ENCOUNTER — Encounter: Payer: Medicare Other | Admitting: Internal Medicine

## 2020-07-06 ENCOUNTER — Telehealth: Payer: Self-pay

## 2020-07-07 NOTE — Telephone Encounter (Signed)
error 

## 2020-07-10 ENCOUNTER — Ambulatory Visit (INDEPENDENT_AMBULATORY_CARE_PROVIDER_SITE_OTHER): Payer: Medicare Other | Admitting: Internal Medicine

## 2020-07-10 ENCOUNTER — Other Ambulatory Visit: Payer: Self-pay

## 2020-07-10 ENCOUNTER — Encounter: Payer: Self-pay | Admitting: Internal Medicine

## 2020-07-10 VITALS — BP 128/82 | HR 67 | Temp 97.6°F | Ht 65.0 in | Wt 198.0 lb

## 2020-07-10 DIAGNOSIS — Z0000 Encounter for general adult medical examination without abnormal findings: Secondary | ICD-10-CM | POA: Diagnosis not present

## 2020-07-10 DIAGNOSIS — R7303 Prediabetes: Secondary | ICD-10-CM | POA: Diagnosis not present

## 2020-07-10 DIAGNOSIS — K279 Peptic ulcer, site unspecified, unspecified as acute or chronic, without hemorrhage or perforation: Secondary | ICD-10-CM

## 2020-07-10 DIAGNOSIS — E034 Atrophy of thyroid (acquired): Secondary | ICD-10-CM | POA: Diagnosis not present

## 2020-07-10 DIAGNOSIS — E2839 Other primary ovarian failure: Secondary | ICD-10-CM | POA: Diagnosis not present

## 2020-07-10 DIAGNOSIS — M3500 Sicca syndrome, unspecified: Secondary | ICD-10-CM

## 2020-07-10 DIAGNOSIS — E782 Mixed hyperlipidemia: Secondary | ICD-10-CM | POA: Diagnosis not present

## 2020-07-10 DIAGNOSIS — I10 Essential (primary) hypertension: Secondary | ICD-10-CM

## 2020-07-10 MED ORDER — HYDROCORTISONE 2.5 % EX CREA: TOPICAL_CREAM | CUTANEOUS | 0 refills | Status: DC

## 2020-07-10 NOTE — Progress Notes (Signed)
Date:  07/10/2020   Name:  SURI TAFOLLA   DOB:  12/18/1960   MRN:  [id number]   Chief Complaint: Annual Exam (No Breast exam no pap)  ZANAYA BAIZE is a 60 y.o. female who presents today for her Complete Annual Exam. She feels fairly well. She reports exercising rides stationary bike x4 days a week. She reports she is sleeping poorly. Breast complaints none.  Mammogram: 02/2020 DEXA: 05/2018 osteopenia Pap smear discontinued Colonoscopy: 02/2020 multiple polyps repeat 2024  Immunization History  Administered Date(s) Administered  . Influenza,inj,Quad PF,6+ Mos 04/24/2016, 03/04/2017, 03/11/2018  . Influenza-Unspecified 03/28/2019  . Zoster Recombinat (Shingrix) 08/03/2019, 11/05/2019    Hypertension This is a chronic problem. The problem is controlled. Associated symptoms include headaches, palpitations ( ) and shortness of breath. Pertinent negatives include no chest pain. Past treatments include diuretics and beta blockers. The current treatment provides significant improvement. There are no compliance problems.  Identifiable causes of hypertension include a thyroid problem.  Thyroid Problem Presents for follow-up visit. Symptoms include fatigue and palpitations ( ). Patient reports no anxiety or constipation. The symptoms have been stable. Her past medical history is significant for hyperlipidemia.  Hyperlipidemia This is a chronic problem. The problem is controlled. Associated symptoms include myalgias and shortness of breath. Pertinent negatives include no chest pain. Current antihyperlipidemic treatment includes statins. The current treatment provides significant improvement of lipids.  Gastroesophageal Reflux She complains of heartburn. She reports no abdominal pain, no chest pain, no coughing, no dysphagia, no sore throat or no wheezing. This is a recurrent (followed by Dr. Allen Norris ) problem. Associated symptoms include fatigue. She has tried a PPI for the symptoms. Past  procedures include an EGD. EGD in 02/2020 - inflamation only.  Headache  This is a recurrent problem. Pertinent negatives include no abdominal pain, coughing, dizziness, fever, numbness, sinus pressure or sore throat. Her past medical history is significant for hypertension.    Lab Results  Component Value Date   CREATININE 0.79 06/11/2020   BUN 7 (L) 06/11/2020   NA 140 06/11/2020   K 3.1 (L) 06/11/2020   CL 98 06/11/2020   CO2 31 06/11/2020   Lab Results  Component Value Date   CHOL 158 01/25/2020   HDL 72 01/25/2020   LDLCALC 59 01/25/2020   TRIG 165 (H) 01/25/2020   CHOLHDL 2.2 01/25/2020   Lab Results  Component Value Date   TSH 3.940 06/25/2019   Lab Results  Component Value Date   HGBA1C 5.7 (H) 05/26/2018   Lab Results  Component Value Date   WBC 15.6 (H) 06/11/2020   HGB 14.5 06/11/2020   HCT 45.9 06/11/2020   MCV 92.2 06/11/2020   PLT 260 06/11/2020   Lab Results  Component Value Date   ALT 26 06/11/2020   AST 23 06/11/2020   ALKPHOS 74 06/11/2020   BILITOT 0.5 06/11/2020     Review of Systems  Constitutional: Positive for fatigue. Negative for chills, fever and unexpected weight change.  HENT: Negative for sinus pressure, sore throat, trouble swallowing and voice change.   Eyes: Negative for visual disturbance.  Respiratory: Positive for shortness of breath. Negative for cough, chest tightness and wheezing.   Cardiovascular: Positive for palpitations ( ). Negative for chest pain.  Gastrointestinal: Positive for heartburn. Negative for abdominal pain, blood in stool, constipation and dysphagia.  Endocrine: Negative for polydipsia and polyuria.  Genitourinary: Negative for dysuria, hematuria and urgency.  Musculoskeletal: Positive for arthralgias and myalgias.  Skin: Negative for color change and rash.  Allergic/Immunologic: Positive for environmental allergies.  Neurological: Positive for headaches. Negative for dizziness, syncope,  light-headedness and numbness.  Psychiatric/Behavioral: Positive for sleep disturbance. Negative for dysphoric mood. The patient is not nervous/anxious.     Patient Active Problem List   Diagnosis Date Noted  . S/P total hysterectomy 03/16/2020  . Acute gastritis without hemorrhage   . Dermatitis of both ear canals 03/01/2020  . Pruritus 01/25/2020  . Gastroesophageal reflux disease with esophagitis without hemorrhage   . Personal history of colonic polyps   . Polyp of ascending colon   . Insomnia 06/25/2019  . Osteopenia determined by x-ray 06/23/2018  . Chronic otitis media 09/30/2017  . Migraine without aura and without status migrainosus, not intractable 05/21/2017  . Not currently working due to disabled status 01/24/2017  . Degenerative arthritis 01/24/2017  . Frequent PVCs 08/20/2016  . Hx of colonic polyps 04/24/2016  . Periodic headache syndrome, not intractable 01/31/2016  . Rosacea, acne 01/31/2016  . Essential hypertension 08/02/2015  . Hypothyroidism due to acquired atrophy of thyroid 08/02/2015  . Fibromyalgia 08/02/2015  . Sjogren's syndrome ([place]) 08/02/2015  . Peptic ulcer disease 08/02/2015  . Primary osteoarthritis of both knees 08/02/2015  . Osteopenia 08/02/2015  . Other allergic rhinitis 08/02/2015  . Hyperlipidemia, mixed 08/02/2015    Allergies  Allergen Reactions  . Ace Inhibitors Cough  . Morphine And Related Nausea Only  . Savella [Milnacipran Hcl] Palpitations    Past Surgical History:  Procedure Laterality Date  . AUGMENTATION MAMMAPLASTY Bilateral 1987  . COLONOSCOPY  05/2012   polyps  . COLONOSCOPY WITH PROPOFOL N/A 06/20/2016   Procedure: COLONOSCOPY WITH PROPOFOL;  Surgeon: [name], MD;  Location: [place];  Service: Endoscopy;  Laterality: N/A;  . COLONOSCOPY WITH PROPOFOL N/A 11/11/2019   Procedure: COLONOSCOPY WITH PROPOFOL;  Surgeon: [name], MD;  Location: [place];  Service: Endoscopy;  Laterality:  N/A;  3  . COLONOSCOPY WITH PROPOFOL N/A 03/06/2020   Procedure: COLONOSCOPY WITH BIOPSY;  Surgeon: [name], MD;  Location: [place];  Service: Endoscopy;  Laterality: N/A;  . ESOPHAGOGASTRODUODENOSCOPY (EGD) WITH PROPOFOL N/A 06/20/2016   Procedure: ESOPHAGOGASTRODUODENOSCOPY (EGD) WITH PROPOFOL;  Surgeon: [name], MD;  Location: [place] [facility];  Service: Endoscopy;  Laterality: N/A;  . ESOPHAGOGASTRODUODENOSCOPY (EGD) WITH PROPOFOL N/A 11/11/2019   Procedure: ESOPHAGOGASTRODUODENOSCOPY (EGD) WITH PROPOFOL;  Surgeon: [name], MD;  Location: [facility];  Service: Endoscopy;  Laterality: N/A;  . ESOPHAGOGASTRODUODENOSCOPY (EGD) WITH PROPOFOL N/A 03/06/2020   Procedure: ESOPHAGOGASTRODUODENOSCOPY (EGD) WITH BIOPSY;  Surgeon: [name], MD;  Location: [facility];  Service: Endoscopy;  Laterality: N/A;  . FOOT SURGERY Right   . GANGLION CYST EXCISION Left   . NASAL SINUS SURGERY    . NEUROMA SURGERY Left   . PARTIAL HYSTERECTOMY  1996   +HPV and cervical dysplasia  . PLACEMENT OF BREAST IMPLANTS    . POLYPECTOMY  06/20/2016   Procedure: POLYPECTOMY;  Surgeon: [name], MD;  Location: [place];  Service: Endoscopy;;  . POLYPECTOMY N/A 03/06/2020   Procedure: POLYPECTOMY;  Surgeon: [name], MD;  Location: [facility];  Service: Endoscopy;  Laterality: N/A;  . REPLACEMENT TOTAL KNEE Right 06/2014  . TONSILLECTOMY      Social History   Tobacco Use  . Smoking status: Never Smoker  . Smokeless tobacco: Never Used  Vaping Use  . Vaping Use: Never used  Substance Use Topics  .  Alcohol use: No    Alcohol/week: 0.0 standard drinks  . Drug use: No     Medication list has been reviewed and updated.  Current Meds  Medication Sig  . albuterol (VENTOLIN HFA) 108 (90 Base) MCG/ACT inhaler Inhale 2 puffs into the lungs every 6 (six) hours as needed for wheezing or shortness of breath.  Marland Kitchen aspirin 81 MG EC tablet Take 81 mg  by mouth daily. Swallow whole.  . butalbital-acetaminophen-caffeine (FIORICET WITH CODEINE) 50-325-40-30 MG capsule TAKE 1 CAPSULE BY MOUTH EVERY 6 (SIX) HOURS AS NEEDED FOR HEADACHE.  . calcium-vitamin D (OSCAL WITH D) 250-125 MG-UNIT tablet Take 1 tablet by mouth daily.  . DULoxetine (CYMBALTA) 60 MG capsule TAKE 1 CAPSULE BY MOUTH  DAILY  . furosemide (LASIX) 40 MG tablet Take 1 tablet (40 mg total) by mouth daily.  Marland Kitchen gabapentin (NEURONTIN) 600 MG tablet TAKE 1 TABLET BY MOUTH 3  TIMES DAILY  . hydrochlorothiazide (HYDRODIURIL) 25 MG tablet Take 12.5 mg by mouth daily.  . hydrocortisone 2.5 % cream APPLY TOPICALLY TWO TIMES   DAILY  . levothyroxine (SYNTHROID) 88 MCG tablet TAKE 1 TABLET BY MOUTH  DAILY BEFORE BREAKFAST  . loratadine (CLARITIN) 10 MG tablet Take 10 mg by mouth daily.  . mometasone (ELOCON) 0.1 % lotion Apply topically daily. To both ears as needed  . montelukast (SINGULAIR) 10 MG tablet TAKE 1 TABLET BY MOUTH AT  BEDTIME  . Multiple Vitamin (MULTIVITAMIN ADULT PO) Take 1 tablet by mouth daily.  . Omega-3 Fatty Acids (FISH OIL) 1000 MG CAPS Take by mouth.  . pantoprazole (PROTONIX) 40 MG tablet TAKE 1 TABLET BY MOUTH  TWICE DAILY  . promethazine (PHENERGAN) 25 MG tablet TAKE 1 TABLET (25 MG TOTAL) BY MOUTH EVERY 8 (EIGHT) HOURS AS NEEDED FOR NAUSEA OR VOMITING.  . propranolol (INDERAL) 40 MG tablet TAKE 1 TABLET BY MOUTH  TWICE DAILY  . rosuvastatin (CRESTOR) 20 MG tablet Take 20 mg by mouth at bedtime.  Marland Kitchen tiZANidine (ZANAFLEX) 4 MG tablet TAKE 1 TABLET BY MOUTH 3  TIMES DAILY  . tobramycin-dexamethasone (TOBRADEX) ophthalmic solution Place 1 drop into both eyes every 6 (six) hours.    PHQ 2/9 Scores 07/10/2020 06/21/2019 12/22/2018 05/26/2018  PHQ - 2 Score 0 0 0 0  PHQ- 9 Score 3 - 0 -    GAD 7 : Generalized Anxiety Score 07/10/2020  Nervous, Anxious, on Edge 0  Control/stop worrying 0  Worry too much - different things 0  Trouble relaxing 0  Restless 0  Easily  annoyed or irritable 0  Afraid - awful might happen 0  Total GAD 7 Score 0    BP Readings from Last 3 Encounters:  07/10/20 128/82  06/12/20 139/90  03/06/20 113/78    Physical Exam Vitals and nursing note reviewed.  Constitutional:      General: She is not in acute distress.    Appearance: Normal appearance. She is well-developed.  HENT:     Head: Normocephalic and atraumatic.     Right Ear: Tympanic membrane and ear canal normal.     Left Ear: Tympanic membrane and ear canal normal.     Nose:     Right Sinus: No maxillary sinus tenderness.     Left Sinus: No maxillary sinus tenderness.  Eyes:     General: No scleral icterus.       Right eye: No discharge.        Left eye: No discharge.     Conjunctiva/sclera:  Conjunctivae normal.  Neck:     Thyroid: No thyromegaly.     Vascular: No carotid bruit.  Cardiovascular:     Rate and Rhythm: Normal rate and regular rhythm.     Pulses: Normal pulses.     Heart sounds: Normal heart sounds.  Pulmonary:     Effort: Pulmonary effort is normal. No respiratory distress.     Breath sounds: No wheezing.  Chest:     Comments: Patient declines breast exam Abdominal:     General: Bowel sounds are normal.     Palpations: Abdomen is soft.     Tenderness: There is no abdominal tenderness.  Musculoskeletal:     Cervical back: Normal range of motion. No erythema.     Right lower leg: No edema.     Left lower leg: No edema.  Lymphadenopathy:     Cervical: No cervical adenopathy.  Skin:    General: Skin is warm and dry.     Findings: No rash.  Neurological:     General: No focal deficit present.     Mental Status: She is alert and oriented to person, place, and time.     Cranial Nerves: No cranial nerve deficit.     Sensory: No sensory deficit.     Deep Tendon Reflexes: Reflexes are normal and symmetric.  Psychiatric:        Attention and Perception: Attention normal.        Mood and Affect: Mood normal.        Behavior: Behavior  normal.     Wt Readings from Last 3 Encounters:  07/10/20 198 lb (89.8 kg)  06/11/20 190 lb (86.2 kg)  03/06/20 190 lb (86.2 kg)    BP 128/82   Pulse 67   Temp 97.6 F (36.4 C) (Oral)   Ht 5\' 5"  (1.651 m)   Wt 198 lb (89.8 kg)   SpO2 96%   BMI 32.95 kg/m   Assessment and Plan: 1. Annual physical exam Exam is normal except for weight. Encourage regular exercise and appropriate dietary changes. - Hemoglobin A1c  2. Essential hypertension Clinically stable exam with well controlled BP. Tolerating medications without side effects at this time. Pt to continue current regimen and low sodium diet; benefits of regular exercise as able discussed. - Comprehensive metabolic panel  3. Peptic ulcer disease Followed by GI On bid Pantoprazole s/p EGD x 2 with gastritis and esophagitis - CBC with Differential/Platelet  4. Hypothyroidism due to acquired atrophy of thyroid supplemented - TSH + free T4  5. Hyperlipidemia, mixed Tolerating statin medication without side effects at this time LDL is at goal of < 70 on current dose Continue same therapy without change at this time. - Lipid panel  6. Sjogren's syndrome without extraglandular involvement ([place]) Not currently being treated or followed by Rheumatology  7. Ovarian failure - DG Bone Density; Future   Partially dictated using Editor, commissioning. Any errors are unintentional.  Halina Maidens, MD [facility]  07/10/2020

## 2020-07-11 LAB — COMPREHENSIVE METABOLIC PANEL
ALT: 25 IU/L (ref 0–32)
AST: 27 IU/L (ref 0–40)
Albumin/Globulin Ratio: 1.4 (ref 1.2–2.2)
Albumin: 4.3 g/dL (ref 3.8–4.8)
Alkaline Phosphatase: 93 IU/L (ref 44–121)
BUN/Creatinine Ratio: 14 (ref 12–28)
BUN: 13 mg/dL (ref 8–27)
Bilirubin Total: 0.4 mg/dL (ref 0.0–1.2)
CO2: 25 mmol/L (ref 20–29)
Calcium: 10.3 mg/dL (ref 8.7–10.3)
Chloride: 95 mmol/L — ABNORMAL LOW (ref 96–106)
Creatinine, Ser: 0.94 mg/dL (ref 0.57–1.00)
GFR calc Af Amer: 76 mL/min/{1.73_m2} (ref >59)
GFR calc non Af Amer: 66 mL/min/{1.73_m2} (ref >59)
Globulin, Total: 3 g/dL (ref 1.5–4.5)
Glucose: 106 mg/dL — ABNORMAL HIGH (ref 65–99)
Potassium: 4.5 mmol/L (ref 3.5–5.2)
Sodium: 139 mmol/L (ref 134–144)
Total Protein: 7.3 g/dL (ref 6.0–8.5)

## 2020-07-11 LAB — CBC WITH DIFFERENTIAL/PLATELET
Basophils Absolute: 0.1 10*3/uL (ref 0.0–0.2)
Basos: 1 %
EOS (ABSOLUTE): 0.4 10*3/uL (ref 0.0–0.4)
Eos: 4 %
Hematocrit: 46.6 % (ref 34.0–46.6)
Hemoglobin: 14.8 g/dL (ref 11.1–15.9)
Immature Grans (Abs): 0 10*3/uL (ref 0.0–0.1)
Immature Granulocytes: 0 %
Lymphocytes Absolute: 2.2 10*3/uL (ref 0.7–3.1)
Lymphs: 22 %
MCH: 27.9 pg (ref 26.6–33.0)
MCHC: 31.8 g/dL (ref 31.5–35.7)
MCV: 88 fL (ref 79–97)
Monocytes Absolute: 0.9 10*3/uL (ref 0.1–0.9)
Monocytes: 9 %
Neutrophils Absolute: 6.6 10*3/uL (ref 1.4–7.0)
Neutrophils: 64 %
Platelets: 262 10*3/uL (ref 150–450)
RBC: 5.3 x10E6/uL — ABNORMAL HIGH (ref 3.77–5.28)
RDW: 13.5 % (ref 11.7–15.4)
WBC: 10.2 10*3/uL (ref 3.4–10.8)

## 2020-07-11 LAB — LIPID PANEL
Chol/HDL Ratio: 2.8 ratio (ref 0.0–4.4)
Cholesterol, Total: 202 mg/dL — ABNORMAL HIGH (ref 100–199)
HDL: 71 mg/dL (ref >39)
LDL Chol Calc (NIH): 102 mg/dL — ABNORMAL HIGH (ref 0–99)
Triglycerides: 173 mg/dL — ABNORMAL HIGH (ref 0–149)
VLDL Cholesterol Cal: 29 mg/dL (ref 5–40)

## 2020-07-11 LAB — TSH+FREE T4
Free T4: 1.68 ng/dL (ref 0.82–1.77)
TSH: 2.8 u[IU]/mL (ref 0.450–4.500)

## 2020-07-11 LAB — HEMOGLOBIN A1C
Est. average glucose Bld gHb Est-mCnc: 123 mg/dL
Hgb A1c MFr Bld: 5.9 % — ABNORMAL HIGH (ref 4.8–5.6)

## 2020-07-11 NOTE — Progress Notes (Signed)
Labs mailed to pt.  KP

## 2020-07-16 ENCOUNTER — Other Ambulatory Visit: Payer: Self-pay | Admitting: Internal Medicine

## 2020-07-16 DIAGNOSIS — E034 Atrophy of thyroid (acquired): Secondary | ICD-10-CM

## 2020-07-25 ENCOUNTER — Ambulatory Visit
Admission: RE | Admit: 2020-07-25 | Discharge: 2020-07-25 | Disposition: A | Discharge status: Home / Self Care | Payer: Medicare Other | Source: Ambulatory Visit | Attending: Internal Medicine | Admitting: Internal Medicine

## 2020-07-25 ENCOUNTER — Other Ambulatory Visit: Payer: Self-pay

## 2020-07-25 DIAGNOSIS — E2839 Other primary ovarian failure: Secondary | ICD-10-CM | POA: Insufficient documentation

## 2020-07-25 DIAGNOSIS — M85851 Other specified disorders of bone density and structure, right thigh: Secondary | ICD-10-CM | POA: Diagnosis not present

## 2020-08-24 ENCOUNTER — Emergency Department: Payer: Medicare Other

## 2020-08-24 ENCOUNTER — Emergency Department: Payer: Medicare Other | Admitting: Anesthesiology

## 2020-08-24 ENCOUNTER — Observation Stay: Payer: Medicare Other

## 2020-08-24 ENCOUNTER — Other Ambulatory Visit: Payer: Self-pay

## 2020-08-24 ENCOUNTER — Encounter
Admission: EM | Disposition: A | Discharge status: Home / Self Care | Payer: Self-pay | Source: Home / Self Care | Attending: Emergency Medicine

## 2020-08-24 ENCOUNTER — Observation Stay
Admission: EM | Admit: 2020-08-24 | Discharge: 2020-08-25 | Disposition: A | Discharge status: Home / Self Care | Payer: Medicare Other | Attending: Surgery | Admitting: Surgery

## 2020-08-24 DIAGNOSIS — S52502A Unspecified fracture of the lower end of left radius, initial encounter for closed fracture: Secondary | ICD-10-CM | POA: Diagnosis not present

## 2020-08-24 DIAGNOSIS — E039 Hypothyroidism, unspecified: Secondary | ICD-10-CM | POA: Diagnosis not present

## 2020-08-24 DIAGNOSIS — K2100 Gastro-esophageal reflux disease with esophagitis, without bleeding: Secondary | ICD-10-CM | POA: Diagnosis not present

## 2020-08-24 DIAGNOSIS — I10 Essential (primary) hypertension: Secondary | ICD-10-CM | POA: Insufficient documentation

## 2020-08-24 DIAGNOSIS — S59292A Other physeal fracture of lower end of radius, left arm, initial encounter for closed fracture: Secondary | ICD-10-CM | POA: Diagnosis not present

## 2020-08-24 DIAGNOSIS — W19XXXA Unspecified fall, initial encounter: Secondary | ICD-10-CM | POA: Diagnosis not present

## 2020-08-24 DIAGNOSIS — S52602A Unspecified fracture of lower end of left ulna, initial encounter for closed fracture: Secondary | ICD-10-CM | POA: Diagnosis not present

## 2020-08-24 DIAGNOSIS — S52592A Other fractures of lower end of left radius, initial encounter for closed fracture: Secondary | ICD-10-CM | POA: Diagnosis not present

## 2020-08-24 DIAGNOSIS — Z20822 Contact with and (suspected) exposure to covid-19: Secondary | ICD-10-CM | POA: Insufficient documentation

## 2020-08-24 DIAGNOSIS — R2689 Other abnormalities of gait and mobility: Secondary | ICD-10-CM | POA: Diagnosis not present

## 2020-08-24 DIAGNOSIS — S62102A Fracture of unspecified carpal bone, left wrist, initial encounter for closed fracture: Secondary | ICD-10-CM | POA: Diagnosis not present

## 2020-08-24 DIAGNOSIS — S52252A Displaced comminuted fracture of shaft of ulna, left arm, initial encounter for closed fracture: Secondary | ICD-10-CM | POA: Insufficient documentation

## 2020-08-24 DIAGNOSIS — E782 Mixed hyperlipidemia: Secondary | ICD-10-CM | POA: Diagnosis not present

## 2020-08-24 DIAGNOSIS — M170 Bilateral primary osteoarthritis of knee: Secondary | ICD-10-CM | POA: Diagnosis not present

## 2020-08-24 DIAGNOSIS — S52692A Other fracture of lower end of left ulna, initial encounter for closed fracture: Secondary | ICD-10-CM | POA: Diagnosis not present

## 2020-08-24 HISTORY — PX: OPEN REDUCTION INTERNAL FIXATION (ORIF) DISTAL RADIAL FRACTURE: SHX5989

## 2020-08-24 HISTORY — DX: Unspecified fracture of the lower end of left radius, initial encounter for closed fracture: S52.502A

## 2020-08-24 LAB — CBC WITH DIFFERENTIAL/PLATELET
Abs Immature Granulocytes: 0.03 10*3/uL (ref 0.00–0.07)
Basophils Absolute: 0.1 10*3/uL (ref 0.0–0.1)
Basophils Relative: 0 %
Eosinophils Absolute: 0.5 10*3/uL (ref 0.0–0.5)
Eosinophils Relative: 4 %
HCT: 46.1 % — ABNORMAL HIGH (ref 36.0–46.0)
Hemoglobin: 14.7 g/dL (ref 12.0–15.0)
Immature Granulocytes: 0 %
Lymphocytes Relative: 13 %
Lymphs Abs: 1.8 10*3/uL (ref 0.7–4.0)
MCH: 28.5 pg (ref 26.0–34.0)
MCHC: 31.9 g/dL (ref 30.0–36.0)
MCV: 89.3 fL (ref 80.0–100.0)
Monocytes Absolute: 0.8 10*3/uL (ref 0.1–1.0)
Monocytes Relative: 6 %
Neutro Abs: 10.2 10*3/uL — ABNORMAL HIGH (ref 1.7–7.7)
Neutrophils Relative %: 77 %
Platelets: 270 10*3/uL (ref 150–400)
RBC: 5.16 MIL/uL — ABNORMAL HIGH (ref 3.87–5.11)
RDW: 12.8 % (ref 11.5–15.5)
WBC: 13.4 10*3/uL — ABNORMAL HIGH (ref 4.0–10.5)
nRBC: 0 % (ref 0.0–0.2)

## 2020-08-24 LAB — RESP PANEL BY RT-PCR (FLU A&B, COVID) ARPGX2
Influenza A by PCR: NEGATIVE
Influenza B by PCR: NEGATIVE
SARS Coronavirus 2 by RT PCR: NEGATIVE

## 2020-08-24 LAB — BASIC METABOLIC PANEL
Anion gap: 13 (ref 5–15)
BUN: 15 mg/dL (ref 8–23)
CO2: 26 mmol/L (ref 22–32)
Calcium: 9.8 mg/dL (ref 8.9–10.3)
Chloride: 100 mmol/L (ref 98–111)
Creatinine, Ser: 0.97 mg/dL (ref 0.44–1.00)
GFR, Estimated: >60 mL/min (ref >60)
Glucose, Bld: 125 mg/dL — ABNORMAL HIGH (ref 70–99)
Potassium: 3.8 mmol/L (ref 3.5–5.1)
Sodium: 139 mmol/L (ref 135–145)

## 2020-08-24 SURGERY — OPEN REDUCTION INTERNAL FIXATION (ORIF) DISTAL RADIUS FRACTURE
Anesthesia: General | Laterality: Left

## 2020-08-24 MED ORDER — SUGAMMADEX SODIUM 500 MG/5ML IV SOLN
INTRAVENOUS | Status: AC
  Filled 2020-08-24: qty 5

## 2020-08-24 MED ORDER — LACTATED RINGERS IV SOLN: INTRAVENOUS | Status: DC

## 2020-08-24 MED ORDER — BISACODYL 10 MG RE SUPP: 10.0000 mg | Freq: Every day | RECTAL | Status: DC | PRN

## 2020-08-24 MED ORDER — ONDANSETRON HCL 4 MG/2ML IJ SOLN
4.0000 mg | Freq: Once | INTRAMUSCULAR | Status: AC
  Administered 2020-08-24: 4 mg via INTRAVENOUS
  Filled 2020-08-24: qty 2

## 2020-08-24 MED ORDER — ONDANSETRON HCL 4 MG/2ML IJ SOLN
4.0000 mg | Freq: Four times a day (QID) | INTRAMUSCULAR | Status: DC | PRN
  Administered 2020-08-24: 4 mg via INTRAVENOUS

## 2020-08-24 MED ORDER — FLEET ENEMA 7-19 GM/118ML RE ENEM: 1.0000 | ENEMA | Freq: Once | RECTAL | Status: DC | PRN

## 2020-08-24 MED ORDER — MIDAZOLAM HCL 2 MG/2ML IJ SOLN
4.0000 mg | Freq: Once | INTRAMUSCULAR | Status: AC
  Administered 2020-08-24: 2 mg via INTRAVENOUS
  Filled 2020-08-24: qty 4

## 2020-08-24 MED ORDER — DEXMEDETOMIDINE HCL 200 MCG/2ML IV SOLN
INTRAVENOUS | Status: DC | PRN
  Administered 2020-08-24: 20 ug via INTRAVENOUS

## 2020-08-24 MED ORDER — CEFAZOLIN SODIUM-DEXTROSE 2-3 GM-%(50ML) IV SOLR
INTRAVENOUS | Status: DC | PRN
  Administered 2020-08-24: 2 g via INTRAVENOUS

## 2020-08-24 MED ORDER — FENTANYL CITRATE (PF) 100 MCG/2ML IJ SOLN
INTRAMUSCULAR | Status: DC | PRN
  Administered 2020-08-24 (×2): 50 ug via INTRAVENOUS

## 2020-08-24 MED ORDER — DOCUSATE SODIUM 100 MG PO CAPS
100.0000 mg | ORAL_CAPSULE | Freq: Two times a day (BID) | ORAL | Status: DC
  Administered 2020-08-24 – 2020-08-25 (×2): 100 mg via ORAL
  Filled 2020-08-24 (×2): qty 1

## 2020-08-24 MED ORDER — SUGAMMADEX SODIUM 500 MG/5ML IV SOLN
INTRAVENOUS | Status: DC | PRN
  Administered 2020-08-24 (×2): 200 mg via INTRAVENOUS

## 2020-08-24 MED ORDER — DIPHENHYDRAMINE HCL 12.5 MG/5ML PO ELIX: 12.5000 mg | ORAL_SOLUTION | ORAL | Status: DC | PRN

## 2020-08-24 MED ORDER — PROPOFOL 10 MG/ML IV BOLUS
INTRAVENOUS | Status: DC | PRN
  Administered 2020-08-24: 180 mg via INTRAVENOUS

## 2020-08-24 MED ORDER — OXYCODONE HCL 5 MG PO TABS: 5.0000 mg | ORAL_TABLET | ORAL | Status: DC | PRN

## 2020-08-24 MED ORDER — ONDANSETRON HCL 4 MG/2ML IJ SOLN: 4.0000 mg | Freq: Once | INTRAMUSCULAR | Status: DC | PRN

## 2020-08-24 MED ORDER — FENTANYL CITRATE (PF) 100 MCG/2ML IJ SOLN
INTRAMUSCULAR | Status: AC
  Filled 2020-08-24: qty 2

## 2020-08-24 MED ORDER — ROCURONIUM BROMIDE 100 MG/10ML IV SOLN
INTRAVENOUS | Status: DC | PRN
  Administered 2020-08-24: 60 mg via INTRAVENOUS

## 2020-08-24 MED ORDER — KETOROLAC TROMETHAMINE 15 MG/ML IJ SOLN
15.0000 mg | Freq: Once | INTRAMUSCULAR | Status: DC
  Filled 2020-08-24: qty 1

## 2020-08-24 MED ORDER — ENOXAPARIN SODIUM 40 MG/0.4ML ~~LOC~~ SOLN
40.0000 mg | SUBCUTANEOUS | Status: DC
  Administered 2020-08-25: 40 mg via SUBCUTANEOUS
  Filled 2020-08-24: qty 0.4

## 2020-08-24 MED ORDER — LIDOCAINE HCL URETHRAL/MUCOSAL 2 % EX GEL
CUTANEOUS | Status: AC
  Filled 2020-08-24: qty 5

## 2020-08-24 MED ORDER — SODIUM CHLORIDE 0.9 % IV SOLN: INTRAVENOUS | Status: DC

## 2020-08-24 MED ORDER — FENTANYL CITRATE (PF) 100 MCG/2ML IJ SOLN
100.0000 ug | Freq: Once | INTRAMUSCULAR | Status: AC
  Administered 2020-08-24: 50 ug via INTRAVENOUS
  Filled 2020-08-24: qty 2

## 2020-08-24 MED ORDER — TRAMADOL HCL 50 MG PO TABS
50.0000 mg | ORAL_TABLET | Freq: Four times a day (QID) | ORAL | Status: DC | PRN
Start: 2020-08-24 — End: 2020-08-25
  Administered 2020-08-25: 50 mg via ORAL
  Filled 2020-08-24: qty 1

## 2020-08-24 MED ORDER — MIDAZOLAM HCL 2 MG/2ML IJ SOLN
INTRAMUSCULAR | Status: AC | PRN
  Administered 2020-08-24: 2 mg via INTRAVENOUS

## 2020-08-24 MED ORDER — FENTANYL CITRATE (PF) 100 MCG/2ML IJ SOLN
INTRAMUSCULAR | Status: AC | PRN
  Administered 2020-08-24: 50 ug via INTRAVENOUS

## 2020-08-24 MED ORDER — ONDANSETRON HCL 4 MG PO TABS: 4.0000 mg | ORAL_TABLET | Freq: Four times a day (QID) | ORAL | Status: DC | PRN

## 2020-08-24 MED ORDER — LACTATED RINGERS IV SOLN: INTRAVENOUS | Status: DC | PRN

## 2020-08-24 MED ORDER — DEXAMETHASONE SODIUM PHOSPHATE 10 MG/ML IJ SOLN
INTRAMUSCULAR | Status: DC | PRN
  Administered 2020-08-24: 10 mg via INTRAVENOUS

## 2020-08-24 MED ORDER — ONDANSETRON HCL 4 MG/2ML IJ SOLN
INTRAMUSCULAR | Status: DC | PRN
  Administered 2020-08-24: 4 mg via INTRAVENOUS

## 2020-08-24 MED ORDER — MAGNESIUM HYDROXIDE 400 MG/5ML PO SUSP: 30.0000 mL | Freq: Every day | ORAL | Status: DC | PRN

## 2020-08-24 MED ORDER — METOCLOPRAMIDE HCL 10 MG PO TABS: 5.0000 mg | ORAL_TABLET | Freq: Three times a day (TID) | ORAL | Status: DC | PRN

## 2020-08-24 MED ORDER — PROPOFOL 10 MG/ML IV BOLUS
INTRAVENOUS | Status: AC
  Filled 2020-08-24: qty 20

## 2020-08-24 MED ORDER — ONDANSETRON HCL 4 MG/2ML IJ SOLN
INTRAMUSCULAR | Status: AC
  Filled 2020-08-24: qty 2

## 2020-08-24 MED ORDER — ACETAMINOPHEN 325 MG PO TABS
325.0000 mg | ORAL_TABLET | Freq: Four times a day (QID) | ORAL | Status: DC | PRN
  Administered 2020-08-25: 650 mg via ORAL
  Filled 2020-08-24: qty 2

## 2020-08-24 MED ORDER — FENTANYL CITRATE (PF) 100 MCG/2ML IJ SOLN
50.0000 ug | Freq: Once | INTRAMUSCULAR | Status: AC
  Administered 2020-08-24: 50 ug via INTRAVENOUS
  Filled 2020-08-24: qty 2

## 2020-08-24 MED ORDER — BUPIVACAINE HCL (PF) 0.5 % IJ SOLN
INTRAMUSCULAR | Status: DC | PRN
  Administered 2020-08-24: 20 mL

## 2020-08-24 MED ORDER — HYDROMORPHONE HCL 1 MG/ML IJ SOLN
0.5000 mg | INTRAMUSCULAR | Status: DC | PRN
  Administered 2020-08-24: 1 mg via INTRAVENOUS
  Filled 2020-08-24: qty 1

## 2020-08-24 MED ORDER — CEFAZOLIN SODIUM 1 G IJ SOLR
INTRAMUSCULAR | Status: AC
  Filled 2020-08-24: qty 10

## 2020-08-24 MED ORDER — FENTANYL CITRATE (PF) 100 MCG/2ML IJ SOLN
INTRAMUSCULAR | Status: AC
  Administered 2020-08-24: 25 ug via INTRAVENOUS
  Filled 2020-08-24: qty 2

## 2020-08-24 MED ORDER — METOCLOPRAMIDE HCL 5 MG/ML IJ SOLN: 5.0000 mg | Freq: Three times a day (TID) | INTRAMUSCULAR | Status: DC | PRN

## 2020-08-24 MED ORDER — FENTANYL CITRATE (PF) 100 MCG/2ML IJ SOLN
25.0000 ug | INTRAMUSCULAR | Status: DC | PRN
Start: 2020-08-24 — End: 2020-08-24
  Administered 2020-08-24: 25 ug via INTRAVENOUS

## 2020-08-24 MED ORDER — LIDOCAINE HCL (CARDIAC) PF 100 MG/5ML IV SOSY
PREFILLED_SYRINGE | INTRAVENOUS | Status: DC | PRN
  Administered 2020-08-24: 100 mg via INTRAVENOUS

## 2020-08-24 MED ORDER — ACETAMINOPHEN 500 MG PO TABS
1000.0000 mg | ORAL_TABLET | Freq: Four times a day (QID) | ORAL | Status: DC
  Administered 2020-08-24 – 2020-08-25 (×2): 1000 mg via ORAL
  Filled 2020-08-24 (×2): qty 2

## 2020-08-24 MED ORDER — KETOROLAC TROMETHAMINE 15 MG/ML IJ SOLN
7.5000 mg | Freq: Four times a day (QID) | INTRAMUSCULAR | Status: DC
  Administered 2020-08-24 – 2020-08-25 (×3): 7.5 mg via INTRAVENOUS
  Filled 2020-08-24 (×3): qty 1

## 2020-08-24 MED ORDER — DEXAMETHASONE SODIUM PHOSPHATE 10 MG/ML IJ SOLN
INTRAMUSCULAR | Status: AC
  Filled 2020-08-24: qty 1

## 2020-08-24 SURGICAL SUPPLY — 52 items
BIT DRILL 2.5X2.75 QC CALB (BIT) ×2 IMPLANT
BNDG COHESIVE 4X5 TAN STRL (GAUZE/BANDAGES/DRESSINGS) IMPLANT
BNDG ELASTIC 3X5.8 VLCR STR LF (GAUZE/BANDAGES/DRESSINGS) ×4 IMPLANT
BNDG ELASTIC 4X5.8 VLCR STR LF (GAUZE/BANDAGES/DRESSINGS) IMPLANT
BNDG ESMARK 4X12 TAN STRL LF (GAUZE/BANDAGES/DRESSINGS) ×2 IMPLANT
CHLORAPREP W/TINT 26 (MISCELLANEOUS) ×2 IMPLANT
CORD BIP STRL DISP 12FT (MISCELLANEOUS) ×2 IMPLANT
COVER PIN YLW 0.028-062 (MISCELLANEOUS) ×4 IMPLANT
COVER WAND RF STERILE (DRAPES) ×2 IMPLANT
CUFF TOURN SGL QUICK 18X4 (TOURNIQUET CUFF) ×2 IMPLANT
DRAPE FLUOR MINI C-ARM 54X84 (DRAPES) ×2 IMPLANT
DRAPE ORTHO SPLIT 77X108 STRL (DRAPES) ×1
DRAPE SURG 17X11 SM STRL (DRAPES) ×2 IMPLANT
DRAPE SURG ORHT 6 SPLT 77X108 (DRAPES) ×1 IMPLANT
DRAPE U-SHAPE 47X51 STRL (DRAPES) ×2 IMPLANT
ELECT CAUTERY BLADE 6.4 (BLADE) ×2 IMPLANT
ELECT REM PT RETURN 9FT ADLT (ELECTROSURGICAL) ×2
ELECTRODE REM PT RTRN 9FT ADLT (ELECTROSURGICAL) ×1 IMPLANT
FORCEPS JEWEL BIP 4-3/4 STR (INSTRUMENTS) ×2 IMPLANT
GAUZE SPONGE 4X4 12PLY STRL (GAUZE/BANDAGES/DRESSINGS) ×2 IMPLANT
GAUZE XEROFORM 1X8 LF (GAUZE/BANDAGES/DRESSINGS) ×2 IMPLANT
GLOVE SURG ENC MOIS LTX SZ8 (GLOVE) ×6 IMPLANT
GLOVE SURG UNDER LTX SZ8 (GLOVE) ×6 IMPLANT
GOWN STRL REUS W/ TWL LRG LVL3 (GOWN DISPOSABLE) ×2 IMPLANT
GOWN STRL REUS W/ TWL XL LVL3 (GOWN DISPOSABLE) ×1 IMPLANT
GOWN STRL REUS W/TWL LRG LVL3 (GOWN DISPOSABLE) ×2
GOWN STRL REUS W/TWL XL LVL3 (GOWN DISPOSABLE) ×1
IMMOBILIZER SHDR XL LX WHT (SOFTGOODS) ×2 IMPLANT
KIT TURNOVER KIT A (KITS) ×2 IMPLANT
MANIFOLD NEPTUNE II (INSTRUMENTS) ×2 IMPLANT
NEEDLE FILTER BLUNT 18X 1/2SAF (NEEDLE) ×1
NEEDLE FILTER BLUNT 18X1 1/2 (NEEDLE) ×1 IMPLANT
NS IRRIG 500ML POUR BTL (IV SOLUTION) ×2 IMPLANT
PACK EXTREMITY ARMC (MISCELLANEOUS) ×2 IMPLANT
PAD ABD DERMACEA PRESS 5X9 (GAUZE/BANDAGES/DRESSINGS) ×2 IMPLANT
PADDING CAST 4IN STRL (MISCELLANEOUS) ×3
PADDING CAST BLEND 4X4 STRL (MISCELLANEOUS) ×3 IMPLANT
PLATE LOCK COMP 6H FOOT (Plate) ×2 IMPLANT
SCREW CORTICAL 3.5MM 14MM (Screw) ×10 IMPLANT
SCREW CORTICAL 3.5MM 18MM (Screw) ×2 IMPLANT
SPLINT CAST 1 STEP 4X15 (MISCELLANEOUS) IMPLANT
STAPLER SKIN PROX 35W (STAPLE) ×2 IMPLANT
STOCKINETTE IMPERVIOUS 9X36 MD (GAUZE/BANDAGES/DRESSINGS) ×4 IMPLANT
STRIP CLOSURE SKIN 1/4X4 (GAUZE/BANDAGES/DRESSINGS) IMPLANT
SUT PROLENE 4 0 PS 2 18 (SUTURE) ×2 IMPLANT
SUT VIC AB 2-0 SH 27 (SUTURE) ×1
SUT VIC AB 2-0 SH 27XBRD (SUTURE) ×1 IMPLANT
SUT VIC AB 3-0 SH 27 (SUTURE) ×1
SUT VIC AB 3-0 SH 27X BRD (SUTURE) ×1 IMPLANT
SWABSTK COMLB BENZOIN TINCTURE (MISCELLANEOUS) IMPLANT
SYR 10ML LL (SYRINGE) ×2 IMPLANT
WIRE Z .045 C-WIRE SPADE TIP (WIRE) ×2 IMPLANT

## 2020-08-24 NOTE — Op Note (Signed)
08/24/2020  9:03 PM  Patient:   Ashley Glover  Pre-Op Diagnosis:   Closed displaced distal radial shaft fracture with comminuted displaced distal ulna fracture, left wrist.  Post-Op Diagnosis:   Same  Procedure:   Open reduction and internal fixation of left distal radial shaft fracture, closed reduction and percutaneous pinning of unstable distal ulnar fracture.  Surgeon:   Pascal Lux, MD  Assistant:   None  Anesthesia:   GET  Findings:   As above.  Complications:   None  Fluids:   1000 cc crystalloid  EBL:   10 cc  UOP:   None  TT:   89 minutes at 250 mmHg  Drains:   None  Closure:   Staples for volar incision and 4-0 Prolene interrupted sutures were percutaneous pin incisions  Implants:   Zimmer-[employer]omet 6-hole 3.5 compression plate and two 5.027 K wires  Brief Clinical Note:   The patient is a 60 year old female who sustained above-noted injury earlier today when she slipped while trying to get to her car as her ankle apparently rolled, causing her to lose balance and fall onto her outstretched left hand.  She was brought to the emergency room where x-rays demonstrated the above-noted injury.  The fracture was reduced to an improved position by the ER provider and splinted.  The patient presents at this time for definitive management of her injury.  Procedure:   The patient was brought into the operating room and laid in the supine position.  After adequate general endotracheal intubation and anesthesia were obtained, the patient's left hand and upper extremity were prepped with ChloraPrep solution before being draped sterilely.  Preoperative antibiotics were administered.  A timeout was performed to verify the appropriate surgical site before the limb was exsanguinated with an Esmarch and the tourniquet inflated to 250 mmHg.  At approximately 4 to 5 inch incision was made over the volar aspect of the distal forearm along the flexor carpi radialis tendon.  The incision  was carried down through the subcutaneous tissues to expose the fascia overlying the flexor carpi radialis.  This was split the length of the incision and the flexor carpi radialis retracted toward the midline.  The deeper fascia was incised.  Distally, the proximal portion of the pronator quadratus was released from its attachment along the radial aspect of the distal radius.  More proximally, the distal portion flexor digitorum profundus was released from along its radial attachment.  The fracture itself was identified and debrided of soft tissue and fracture hematoma using a small curette.  The fracture was reduced and temporarily secured using bone-holding tenaculums.    After assessing the fracture alignment, it was felt that this fracture would best be secured using a 6-hole 3.5 compression plate.  The plate was positioned appropriately and temporarily secured using lobster claw clamps.  A bicortical screw of appropriate length was placed through the plate into the distal fragment.  A second bicortical screw was placed proximally in one of the slotted holes in the offset position to provide compression across the fracture.  Once this screw was secured, the remaining 4 holes were filled with bicortical screws of appropriate length.  The adequacy of fracture reduction was verified using FluoroScan imaging in AP and lateral projections and found to be excellent.  The distal ulna fracture was noted to be quite unstable during pronation and supination.  This fracture was too distal to permit open fixation.  Therefore, it was felt best to stabilize the  the distal ulna to the distal radius with a 0.045 K wire.  A second 0.045 K wire was placed through the ulnar styloid into the ulnar shaft.  The adequacy of distal ulna fixation and K wire position was assessed using FluoroScan imaging and found to be satisfactory.  The wounds were copiously irrigated with sterile saline solution before the volar incision was  closed using 2-0 Vicryl interrupted sutures for the subcutaneous tissues and staples for the skin.  The skin around the pin sites was cut sufficiently to minimize any undue pressure against the pins.  Several 4-0 Prolene interrupted sutures were used to close these incisions.  The pins were then bent over and cut short, leaving them outside the skin.  A total of 20 cc of 0.5% plain Sensorcaine was injected in and around all incisions and pin sites before a sterile bulky dressing was applied to all wounds.  The patient was then placed into a sugar tong splint maintaining the wrist in approximately 45 to 50 degrees of pronation to minimize stress across the distal radioulnar joint.  The patient was placed into a shoulder immobilizer before she was awakened, extubated, and returned to the recovery room in satisfactory condition after tolerating the procedure well.

## 2020-08-24 NOTE — H&P (Signed)
Subjective:  Chief complaint: Left wrist pain.  The patient is a 60 y.o. female who sustained an injury to the left wrist earlier this afternoon. Apparently she was approaching her car when she somehow rolled her ankle causing her to fall awkwardly. She reached out for her car but was unable to stabilize herself and fell onto her outstretched left hand, injuring her wrist. The patient denies any associated injury. The patient did not strike her head or lose consciousness. The patient also denies any light-headedness, dizziness, chest pain, or shortness of breath which might have contributed to the injury. The patient does recall having broken this same wrist approximately 35 years ago. This injury was treated nonsurgically. The patient notes that she has had an intermittent clicking in her wrist ever since as well as some mild residual deformity in the wrist.  Patient Active Problem List   Diagnosis Date Noted  . S/P total hysterectomy 03/16/2020  . Acute gastritis without hemorrhage   . Dermatitis of both ear canals 03/01/2020  . Pruritus 01/25/2020  . Gastroesophageal reflux disease with esophagitis without hemorrhage   . Personal history of colonic polyps   . Polyp of ascending colon   . Insomnia 06/25/2019  . Osteopenia determined by x-ray 06/23/2018  . Chronic otitis media 09/30/2017  . Migraine without aura and without status migrainosus, not intractable 05/21/2017  . Not currently working due to disabled status 01/24/2017  . Degenerative arthritis 01/24/2017  . Frequent PVCs 08/20/2016  . Hx of colonic polyps 04/24/2016  . Periodic headache syndrome, not intractable 01/31/2016  . Rosacea, acne 01/31/2016  . Essential hypertension 08/02/2015  . Hypothyroidism due to acquired atrophy of thyroid 08/02/2015  . Fibromyalgia 08/02/2015  . Sjogren's syndrome ([place]) 08/02/2015  . Peptic ulcer disease 08/02/2015  . Primary osteoarthritis of both knees 08/02/2015  . Osteopenia 08/02/2015   . Other allergic rhinitis 08/02/2015  . Hyperlipidemia, mixed 08/02/2015   Past Medical History:  Diagnosis Date  . Allergic rhinitis   . Benign neoplasm of ascending colon   . Benign neoplasm of cecum   . Degenerative joint disease   . Fibromyalgia   . GERD (gastroesophageal reflux disease)   . Hx of abnormal cervical Pap smear 08/02/2015   1990's - had partial hysterectomy for dysplasia and HPV   . Hyperlipidemia   . Hypertension   . Hypothyroid   . Mitral valve prolapse    states it is mild and does not see a cardiologist  . Redness of both eyes 09/30/2017  . Sjogrens syndrome [facility])     Past Surgical History:  Procedure Laterality Date  . AUGMENTATION MAMMAPLASTY Bilateral 1987  . COLONOSCOPY  05/2012   polyps  . COLONOSCOPY WITH PROPOFOL N/A 06/20/2016   Procedure: COLONOSCOPY WITH PROPOFOL;  Surgeon: [name], MD;  Location: [place];  Service: Endoscopy;  Laterality: N/A;  . COLONOSCOPY WITH PROPOFOL N/A 11/11/2019   Procedure: COLONOSCOPY WITH PROPOFOL;  Surgeon: [name], MD;  Location: [place];  Service: Endoscopy;  Laterality: N/A;  3  . COLONOSCOPY WITH PROPOFOL N/A 03/06/2020   Procedure: COLONOSCOPY WITH BIOPSY;  Surgeon: [name], MD;  Location: [place];  Service: Endoscopy;  Laterality: N/A;  . ESOPHAGOGASTRODUODENOSCOPY (EGD) WITH PROPOFOL N/A 06/20/2016   Procedure: ESOPHAGOGASTRODUODENOSCOPY (EGD) WITH PROPOFOL;  Surgeon: [name], MD;  Location: [place];  Service: Endoscopy;  Laterality: N/A;  . ESOPHAGOGASTRODUODENOSCOPY (EGD) WITH PROPOFOL N/A 11/11/2019   Procedure: ESOPHAGOGASTRODUODENOSCOPY (EGD) WITH PROPOFOL;  Surgeon: [name],  MD;  Location: [place];  Service: Endoscopy;  Laterality: N/A;  . ESOPHAGOGASTRODUODENOSCOPY (EGD) WITH PROPOFOL N/A 03/06/2020   Procedure: ESOPHAGOGASTRODUODENOSCOPY (EGD) WITH BIOPSY;  Surgeon: [name], MD;  Location: [place];  Service:  Endoscopy;  Laterality: N/A;  . FOOT SURGERY Right   . GANGLION CYST EXCISION Left   . NASAL SINUS SURGERY    . NEUROMA SURGERY Left   . PARTIAL HYSTERECTOMY  1996   +HPV and cervical dysplasia  . PLACEMENT OF BREAST IMPLANTS    . POLYPECTOMY  06/20/2016   Procedure: POLYPECTOMY;  Surgeon: [name], MD;  Location: [place];  Service: Endoscopy;;  . POLYPECTOMY N/A 03/06/2020   Procedure: POLYPECTOMY;  Surgeon: [name], MD;  Location: [place];  Service: Endoscopy;  Laterality: N/A;  . REPLACEMENT TOTAL KNEE Right 06/2014  . TONSILLECTOMY      (Not in a hospital admission)  Allergies  Allergen Reactions  . Ace Inhibitors Cough  . Morphine And Related Nausea Only  . Savella [Milnacipran Hcl] Palpitations    Social History   Tobacco Use  . Smoking status: Never Smoker  . Smokeless tobacco: Never Used  Substance Use Topics  . Alcohol use: No    Alcohol/week: 0.0 standard drinks    Family History  Problem Relation Age of Onset  . Pulmonary embolism Mother   . Hypertension Mother   . AVM Father   . Hypertension Father   . Hypothyroidism Father   . Breast cancer Neg Hx      Review of Systems: As noted above. The patient denies any chest pain, shortness of breath, nausea, vomiting, diarrhea, constipation, belly pain, blood in his/her stool, or burning with urination.  Objective: Temp:  [98.4 F (36.9 C)] 98.4 F (36.9 C) (03/31 1516) Pulse Rate:  [81-92] 81 (03/31 1539) Resp:  [15-20] 15 (03/31 1539) BP: (122-147)/(93-119) 136/108 (03/31 1539) SpO2:  [95 %-98 %] 95 % (03/31 1539) Weight:  [86.2 kg] 86.2 kg (03/31 1338)  Physical Exam: General:  Alert, no acute distress Psychiatric:  Patient is competent for consent with normal mood and affect Cardiovascular:  RRR  Respiratory:  Clear to auscultation. No wheezing. Non-labored breathing GI:  Abdomen is soft and non-tender Skin:  No lesions in the area of chief complaint Neurologic:   Sensation intact distally Lymphatic:  No axillary or cervical lymphadenopathy  Orthopedic Exam:  Orthopedic examination is limited to the left hand and upper extremity.  The hand is in a sugar tong splint maintaining the wrist in neutral position.  The skin is intact at the proximal distal margins of the splint.  She is able to flex and extend her fingers actively, although motion is limited due to pain and apprehension.  She sensation is intact to light touch to the tips of all digits.  She has good capillary refill to all digits.  Imaging Review: Recent pre and post reduction x-rays of the left forearm are available for review and have been reviewed by myself.  These films demonstrate an essentially transverse fracture through the distal radial shaft, as well as a comminuted fracture of the distal ulna.  Postreduction films demonstrate bayonet apposition of approximately 40-50% of the radial shaft fracture as well as improved alignment of the distal ulna fracture.  No lytic lesions or significant degenerative changes are identified..  Assessment: Closed displaced left distal radius and ulnar fractures.  Plan: The treatment options, including both surgical and nonsurgical choices, have been discussed in detail with  the patient. The patient would like to proceed with surgical intervention to include an open reduction and internal fixation of the left distal radial shaft fracture as well as possible open versus closed reduction and possible percutaneous pinning of the distal ulnar fracture. The risks (including bleeding, infection, nerve and/or blood vessel injury, persistent or recurrent pain, loosening or failure of the hardware, malunion and/or nonunion, stiffness of the wrist, development of degenerative joint disease, need for further surgery, blood clots, strokes, heart attacks or arrhythmias, pneumonia, etc.) and benefits of the surgical procedure were discussed. The patient states her  understanding and agrees to proceed. She agrees to a blood transfusion if necessary. A formal written consent has been obtained.

## 2020-08-24 NOTE — Plan of Care (Signed)
Pt arrived unit from the PACU, alert and drowsy, on 1L [place]. Pt reported pain and PRN medication was given per order. Pt oriented to the unit. L wrist elevated with ice pack.  Problem: Pain Managment: Goal: General experience of comfort will improve Outcome: Progressing   Problem: Safety: Goal: Ability to remain free from injury will improve Outcome: Progressing   Problem: Skin Integrity: Goal: Risk for impaired skin integrity will decrease Outcome: Progressing

## 2020-08-24 NOTE — Anesthesia Preprocedure Evaluation (Signed)
Anesthesia Evaluation  Patient identified by MRN, date of birth, ID band Patient awake    Reviewed: Allergy & Precautions, NPO status , Patient's Chart, lab work & pertinent test results  History of Anesthesia Complications Negative for: history of anesthetic complications  Airway Mallampati: III       Dental   Pulmonary neg sleep apnea, neg COPD, Not current smoker,           Cardiovascular hypertension, Pt. on medications (-) Past MI and (-) CHF (-) dysrhythmias (-) Valvular Problems/Murmurs     Neuro/Psych neg Seizures    GI/Hepatic Neg liver ROS, PUD, GERD  Medicated and Controlled,  Endo/Other  neg diabetesHypothyroidism   Renal/GU negative Renal ROS     Musculoskeletal   Abdominal   Peds  Hematology   Anesthesia Other Findings   Reproductive/Obstetrics                             Anesthesia Physical Anesthesia Plan  ASA: III and emergent  Anesthesia Plan: General   Post-op Pain Management:    Induction: Intravenous  PONV Risk Score and Plan: 3 and Ondansetron and Dexamethasone  Airway Management Planned: Oral ETT  Additional Equipment:   Intra-op Plan:   Post-operative Plan:   Informed Consent: I have reviewed the patients History and Physical, chart, labs and discussed the procedure including the risks, benefits and alternatives for the proposed anesthesia with the patient or authorized representative who has indicated his/her understanding and acceptance.       Plan Discussed with:   Anesthesia Plan Comments:         Anesthesia Quick Evaluation

## 2020-08-24 NOTE — ED Notes (Signed)
Surgeon at bedside , consent signed.  Advised if surgeon wanted to call pt niece for verbal consent, due to pt being sedated with fentanyl and versed at 15:29. Surgeon stated he was ok with patient signing consent do to pt being alert and oriented.   OR Nurse made aware of consent signed by patient.

## 2020-08-24 NOTE — Anesthesia Procedure Notes (Addendum)
Procedure Name: Intubation Date/Time: 08/24/2020 6:39 PM Performed by: Justus Memory, CRNA Pre-anesthesia Checklist: Patient identified, Patient being monitored, Timeout performed, Emergency Drugs available and Suction available Patient Re-evaluated:Patient Re-evaluated prior to induction Oxygen Delivery Method: Circle system utilized Preoxygenation: Pre-oxygenation with 100% oxygen Induction Type: IV induction Ventilation: Mask ventilation without difficulty Laryngoscope Size: Mac, 3 and McGraph Grade View: Grade I Tube type: Oral Tube size: 7.0 mm Number of attempts: 1 Airway Equipment and Method: Stylet and Video-laryngoscopy Placement Confirmation: ETT inserted through vocal cords under direct vision,  positive ETCO2 and breath sounds checked- equal and bilateral Secured at: 21 cm Tube secured with: Tape Dental Injury: Teeth and Oropharynx as per pre-operative assessment  Difficulty Due To: Difficulty was anticipated, Difficult Airway- due to large tongue and Difficult Airway- due to anterior larynx Future Recommendations: Recommend- induction with short-acting agent, and alternative techniques readily available

## 2020-08-24 NOTE — ED Notes (Signed)
ED Provider at bedside. 

## 2020-08-24 NOTE — ED Triage Notes (Signed)
Pt to ER after mechanical fall today. Denies LOC or hitting head. Pt with obvious deformity to multiple places on L forearm, 2+ radial pulse and good cap refill.

## 2020-08-24 NOTE — ED Provider Notes (Signed)
[facility] Emergency Department Provider Note ____________________________________________   Event Date/Time   First MD Initiated Contact with Patient 08/24/20 1408     (approximate)  I have reviewed the triage vital signs and the nursing notes.   HISTORY  Chief Complaint Fall    HPI LASHAYA KIENITZ is a 60 y.o. female with PMH as noted below who presents with a left wrist/forearm injury, acute onset a short time ago when she had a mechanical fall from standing height.  The patient states that she twisted her ankle and fell.  She denies any significant ankle pain and was able to bear weight on the ankle.  She did not hit her head and has no other injuries.  Past Medical History:  Diagnosis Date  . Allergic rhinitis   . Benign neoplasm of ascending colon   . Benign neoplasm of cecum   . Degenerative joint disease   . Fibromyalgia   . GERD (gastroesophageal reflux disease)   . Hx of abnormal cervical Pap smear 08/02/2015   1990's - had partial hysterectomy for dysplasia and HPV   . Hyperlipidemia   . Hypertension   . Hypothyroid   . Mitral valve prolapse    states it is mild and does not see a cardiologist  . Redness of both eyes 09/30/2017  . Sjogrens syndrome [facility])     Patient Active Problem List   Diagnosis Date Noted  . Distal radius fracture, left 08/24/2020  . S/P total hysterectomy 03/16/2020  . Acute gastritis without hemorrhage   . Dermatitis of both ear canals 03/01/2020  . Pruritus 01/25/2020  . Gastroesophageal reflux disease with esophagitis without hemorrhage   . Personal history of colonic polyps   . Polyp of ascending colon   . Insomnia 06/25/2019  . Osteopenia determined by x-ray 06/23/2018  . Chronic otitis media 09/30/2017  . Migraine without aura and without status migrainosus, not intractable 05/21/2017  . Not currently working due to disabled status 01/24/2017  . Degenerative arthritis 01/24/2017  . Frequent PVCs  08/20/2016  . Hx of colonic polyps 04/24/2016  . Periodic headache syndrome, not intractable 01/31/2016  . Rosacea, acne 01/31/2016  . Essential hypertension 08/02/2015  . Hypothyroidism due to acquired atrophy of thyroid 08/02/2015  . Fibromyalgia 08/02/2015  . Sjogren's syndrome ([place]) 08/02/2015  . Peptic ulcer disease 08/02/2015  . Primary osteoarthritis of both knees 08/02/2015  . Osteopenia 08/02/2015  . Other allergic rhinitis 08/02/2015  . Hyperlipidemia, mixed 08/02/2015    Past Surgical History:  Procedure Laterality Date  . AUGMENTATION MAMMAPLASTY Bilateral 1987  . COLONOSCOPY  05/2012   polyps  . COLONOSCOPY WITH PROPOFOL N/A 06/20/2016   Procedure: COLONOSCOPY WITH PROPOFOL;  Surgeon: [name], MD;  Location: [place];  Service: Endoscopy;  Laterality: N/A;  . COLONOSCOPY WITH PROPOFOL N/A 11/11/2019   Procedure: COLONOSCOPY WITH PROPOFOL;  Surgeon: [name], MD;  Location: [place];  Service: Endoscopy;  Laterality: N/A;  3  . COLONOSCOPY WITH PROPOFOL N/A 03/06/2020   Procedure: COLONOSCOPY WITH BIOPSY;  Surgeon: [name], MD;  Location: [place];  Service: Endoscopy;  Laterality: N/A;  . ESOPHAGOGASTRODUODENOSCOPY (EGD) WITH PROPOFOL N/A 06/20/2016   Procedure: ESOPHAGOGASTRODUODENOSCOPY (EGD) WITH PROPOFOL;  Surgeon: [name], MD;  Location: [place];  Service: Endoscopy;  Laterality: N/A;  . ESOPHAGOGASTRODUODENOSCOPY (EGD) WITH PROPOFOL N/A 11/11/2019   Procedure: ESOPHAGOGASTRODUODENOSCOPY (EGD) WITH PROPOFOL;  Surgeon: [name], MD;  Location: [place];  Service: Endoscopy;  Laterality: N/A;  . ESOPHAGOGASTRODUODENOSCOPY (EGD) WITH PROPOFOL N/A 03/06/2020   Procedure: ESOPHAGOGASTRODUODENOSCOPY (EGD) WITH BIOPSY;  Surgeon: [name], MD;  Location: [place];  Service: Endoscopy;  Laterality: N/A;  . FOOT SURGERY Right   . GANGLION CYST EXCISION Left   . NASAL SINUS SURGERY    .  NEUROMA SURGERY Left   . PARTIAL HYSTERECTOMY  1996   +HPV and cervical dysplasia  . PLACEMENT OF BREAST IMPLANTS    . POLYPECTOMY  06/20/2016   Procedure: POLYPECTOMY;  Surgeon: [name], MD;  Location: [place];  Service: Endoscopy;;  . POLYPECTOMY N/A 03/06/2020   Procedure: POLYPECTOMY;  Surgeon: [name], MD;  Location: [place];  Service: Endoscopy;  Laterality: N/A;  . REPLACEMENT TOTAL KNEE Right 06/2014  . TONSILLECTOMY      Prior to Admission medications   Medication Sig Start Date End Date Taking? Authorizing Provider  albuterol (VENTOLIN HFA) 108 (90 Base) MCG/ACT inhaler Inhale 2 puffs into the lungs every 6 (six) hours as needed for wheezing or shortness of breath. 12/22/18   Glean Hess, MD  aspirin 81 MG EC tablet Take 81 mg by mouth daily. Swallow whole.    [provider]  butalbital-acetaminophen-caffeine (FIORICET WITH CODEINE) 50-325-40-30 MG capsule TAKE 1 CAPSULE BY MOUTH EVERY 6 (SIX) HOURS AS NEEDED FOR HEADACHE. 05/24/20   Glean Hess, MD  calcium-vitamin D (OSCAL WITH D) 250-125 MG-UNIT tablet Take 1 tablet by mouth daily.    [provider]  DULoxetine (CYMBALTA) 60 MG capsule TAKE 1 CAPSULE BY MOUTH  DAILY 07/16/20   Glean Hess, MD  furosemide (LASIX) 40 MG tablet Take 1 tablet (40 mg total) by mouth daily. 05/12/17   Glean Hess, MD  gabapentin (NEURONTIN) 600 MG tablet TAKE 1 TABLET BY MOUTH 3  TIMES DAILY 02/06/20   Glean Hess, MD  hydrochlorothiazide (HYDRODIURIL) 25 MG tablet Take 12.5 mg by mouth daily.    [provider]  hydrocortisone 2.5 % cream APPLY TOPICALLY TWO TIMES   DAILY 07/10/20   Glean Hess, MD  levothyroxine (SYNTHROID) 88 MCG tablet TAKE 1 TABLET BY MOUTH  DAILY BEFORE BREAKFAST 07/16/20   Glean Hess, MD  loratadine (CLARITIN) 10 MG tablet Take 10 mg by mouth daily.    [provider]  mometasone (ELOCON) 0.1 % lotion Apply topically  daily. To both ears as needed 03/01/20   Glean Hess, MD  montelukast (SINGULAIR) 10 MG tablet TAKE 1 TABLET BY MOUTH AT  BEDTIME 02/06/20   Glean Hess, MD  Multiple Vitamin (MULTIVITAMIN ADULT PO) Take 1 tablet by mouth daily.    [provider]  Omega-3 Fatty Acids (FISH OIL) 1000 MG CAPS Take by mouth.    [provider]  pantoprazole (PROTONIX) 40 MG tablet TAKE 1 TABLET BY MOUTH  TWICE DAILY 02/07/20   [name], MD  promethazine (PHENERGAN) 25 MG tablet TAKE 1 TABLET (25 MG TOTAL) BY MOUTH EVERY 8 (EIGHT) HOURS AS NEEDED FOR NAUSEA OR VOMITING. 05/24/20   Glean Hess, MD  propranolol (INDERAL) 40 MG tablet TAKE 1 TABLET BY MOUTH  TWICE DAILY 06/27/20   Glean Hess, MD  rosuvastatin (CRESTOR) 20 MG tablet Take 20 mg by mouth at bedtime. 12/06/19   [provider]  tiZANidine (ZANAFLEX) 4 MG tablet TAKE 1 TABLET BY MOUTH 3  TIMES DAILY 05/08/20   Glean Hess, MD  tobramycin-dexamethasone [facility]) ophthalmic solution Place 1  drop into both eyes every 6 (six) hours. 12/22/18   Glean Hess, MD  zolpidem (AMBIEN CR) 6.25 MG CR tablet Take 1 tablet (6.25 mg total) by mouth at bedtime as needed for sleep. Patient not taking: Reported on 07/10/2020 06/28/19   Glean Hess, MD    Allergies Ace inhibitors, Morphine and related, and Savella [milnacipran hcl]  Family History  Problem Relation Age of Onset  . Pulmonary embolism Mother   . Hypertension Mother   . AVM Father   . Hypertension Father   . Hypothyroidism Father   . Breast cancer Neg Hx     Social History Social History   Tobacco Use  . Smoking status: Never Smoker  . Smokeless tobacco: Never Used  Vaping Use  . Vaping Use: Never used  Substance Use Topics  . Alcohol use: No    Alcohol/week: 0.0 standard drinks  . Drug use: No    Review of Systems  Constitutional: No fever. Eyes: No redness. ENT: No sore throat. Cardiovascular: Denies chest  pain. Respiratory: Denies shortness of breath. Gastrointestinal: No vomiting. Genitourinary: Negative for dysuria.  Musculoskeletal: Positive for left wrist pain. Skin: Negative for rash. Neurological: Negative for focal weakness or numbness.   ____________________________________________   PHYSICAL EXAM:  VITAL SIGNS: ED Triage Vitals  Enc Vitals Group     BP 08/24/20 1334 (!) 147/119     Pulse Rate 08/24/20 1334 92     Resp 08/24/20 1334 20     Temp 08/24/20 1342 98.4 F (36.9 C)     Temp Source 08/24/20 1342 Oral     SpO2 08/24/20 1334 95 %     Weight 08/24/20 1338 190 lb (86.2 kg)     Height 08/24/20 1338 5\' 4"  (1.626 m)     Head Circumference --      Peak Flow --      Pain Score 08/24/20 1338 10     Pain Loc --      Pain Edu? --      Excl. in [place]? --     Constitutional: Alert and oriented.  Uncomfortable appearing but in no acute distress. Eyes: Conjunctivae are normal.  Head: Atraumatic. Nose: No congestion/rhinnorhea. Mouth/Throat: Mucous membranes are moist.   Neck: Normal range of motion.  Cardiovascular: Normal rate, regular rhythm.  Good peripheral circulation. Respiratory: Normal respiratory effort.  No retractions.  Gastrointestinal: No distention.  Musculoskeletal: No lower extremity edema.  Extremities warm and well perfused.  Left wrist/distal forearm deformity with 2+ radial pulse.  Normal cap refill. Neurologic:  Normal speech and language. No gross focal neurologic deficits are appreciated.  Motor and sensory intact in all distributions of left hand. Skin:  Skin is warm and dry. No rash noted. Psychiatric: Mood and affect are normal. Speech and behavior are normal.  ____________________________________________   LABS (all labs ordered are listed, but only abnormal results are displayed)  Labs Reviewed  BASIC METABOLIC PANEL - Abnormal; Notable for the following components:      Result Value   Glucose, Bld 125 (*)    All other components  within normal limits  CBC WITH DIFFERENTIAL/PLATELET - Abnormal; Notable for the following components:   WBC 13.4 (*)    RBC 5.16 (*)    HCT 46.1 (*)    Neutro Abs 10.2 (*)    All other components within normal limits  RESP PANEL BY RT-PCR (FLU A&B, COVID) [id number]   ____________________________________________  EKG   ____________________________________________  RADIOLOGY  XR  left forearm interpreted by me shows displaced distal radius and ulna fractures  ____________________________________________   PROCEDURES  Procedure(s) performed: Yes  .Ortho Injury Treatment  Date/Time: 08/24/2020 9:14 PM Performed by: Arta Silence, MD Authorized by: Arta Silence, MD   Consent:    Consent obtained:  Written   Consent given by:  Patient   Risks discussed:  Fracture, nerve damage, vascular damage and recurrent dislocation   Alternatives discussed:  ImmobilizationInjury location: forearm Location details: left forearm Injury type: fracture Fracture type: radial shaft Pre-procedure neurovascular assessment: neurovascularly intact  Anesthesia: Local anesthesia used: no Manipulation performed: yes Reduction successful: yes X-ray confirmed reduction: yes Immobilization: splint Splint type: sugar tong Splint Applied by: ED Provider Post-procedure neurovascular assessment: post-procedure neurovascularly intact  .Sedation  Date/Time: 08/24/2020 9:17 PM Performed by: Arta Silence, MD Authorized by: Arta Silence, MD   Consent:    Consent obtained:  Written   Consent given by:  Patient   Risks discussed:  Allergic reaction, prolonged hypoxia resulting in organ damage, prolonged sedation necessitating reversal, dysrhythmia, inadequate sedation, respiratory compromise necessitating ventilatory assistance and intubation and vomiting Universal protocol:    Procedure explained and questions answered to patient or proxy's satisfaction: yes     Imaging  studies available: yes     Immediately prior to procedure, a time out was called: yes     Patient identity confirmed:  Arm band and verbally with patient Indications:    Procedure performed:  Fracture reduction   Procedure necessitating sedation performed by:  Physician performing sedation Pre-sedation assessment:    Time since last food or drink:  6 hours   ASA classification: class 1 - normal, healthy patient     Mallampati score:  I - soft palate, uvula, fauces, pillars visible   Pre-sedation assessments completed and reviewed: airway patency, cardiovascular function, mental status, nausea/vomiting, pain level and respiratory function   Procedure details (see MAR for exact dosages):    Preoxygenation:  Nasal cannula   Sedation:  Midazolam   Intended level of sedation: moderate (conscious sedation)   Analgesia:  Fentanyl   Intra-procedure monitoring:  Blood pressure monitoring, continuous capnometry, frequent LOC assessments, cardiac monitor, continuous pulse oximetry and frequent vital sign checks   Intra-procedure events: none     Total Provider sedation time (minutes):  10 Post-procedure details:    Attendance: Constant attendance by certified staff until patient recovered     Recovery: Patient returned to pre-procedure baseline     Patient is stable for discharge or admission: yes     Procedure completion:  Tolerated well, no immediate complications    Critical Care performed: No ____________________________________________   INITIAL IMPRESSION / ASSESSMENT AND PLAN / ED COURSE  Pertinent labs & imaging results that were available during my care of the patient were reviewed by me and considered in my medical decision making (see chart for details).  60 year old female presents with left distal forearm deformity after mechanical fall from standing height.  She denies other injuries other than mildly twisting her ankle (although she was able to bear weight on it  afterwards).  On exam, there is a deformity to the left distal forearm although the patient has intact motor and fine touch sensation to all distributions in the hand and an intact radial pulse and cap refill.  Presentation is concerning for fracture dislocation.  We will obtain an x-ray for further evaluation.  ----------------------------------------- 4:19 PM on 08/24/2020 -----------------------------------------  X-ray revealed distal radius and ulna fractures with displacement.  I  consulted Dr. Roland Rack from orthopedics who recommended reducing the fracture and placing the patient in a splint.  He advised that he would admit the patient for surgery later today.  The reduction was performed under moderate sedation without any acute complications.  Postreduction x-ray confirmed good improvement in alignment.  ____________________________________________   FINAL CLINICAL IMPRESSION(S) / ED DIAGNOSES  Final diagnoses:  Closed fracture of distal ends of left radius and ulna, initial encounter      NEW MEDICATIONS STARTED DURING THIS VISIT:  Current Discharge Medication List       Note:  This document was prepared using Dragon voice recognition software and may include unintentional dictation errors.   Arta Silence, MD 08/24/20 2120

## 2020-08-24 NOTE — Sedation Documentation (Addendum)
Paper consents and moderate sedation discharge paper signed d/t electronic consents being down at this time.

## 2020-08-24 NOTE — Anesthesia Postprocedure Evaluation (Signed)
Anesthesia Post Note  Patient: Ashley Glover  Procedure(s) Performed: OPEN REDUCTION INTERNAL FIXATION (ORIF) DISTAL RADIAL FRACTURE (Left )  Patient location during evaluation: PACU Anesthesia Type: General Level of consciousness: awake and alert Pain management: pain level controlled Vital Signs Assessment: post-procedure vital signs reviewed and stable Respiratory status: spontaneous breathing and respiratory function stable Cardiovascular status: stable Anesthetic complications: no   No complications documented.   Last Vitals:  Vitals:   08/24/20 2125 08/24/20 2130  BP:  (!) 139/97  Pulse: 82 82  Resp: 11 11  Temp:    SpO2: 95% 98%    Last Pain:  Vitals:   08/24/20 2130  TempSrc:   PainSc: Asleep                 [name] K

## 2020-08-24 NOTE — ED Notes (Signed)
Pt taken to OR via OR tech. Pt calm , collective ,

## 2020-08-24 NOTE — Transfer of Care (Signed)
Immediate Anesthesia Transfer of Care Note  Patient: Ashley Glover  Procedure(s) Performed: OPEN REDUCTION INTERNAL FIXATION (ORIF) DISTAL RADIAL FRACTURE (Left )  Patient Location: PACU  Anesthesia Type:General  Level of Consciousness: sedated  Airway & Oxygen Therapy: Patient Spontanous Breathing and Patient connected to face mask oxygen  Post-op Assessment: Report given to RN and Post -op Vital signs reviewed and stable  Post vital signs: Reviewed and stable  Last Vitals:  Vitals Value Taken Time  BP 146/108 08/24/20 2100  Temp    Pulse 93 08/24/20 2101  Resp 20 08/24/20 2101  SpO2 95 % 08/24/20 2101  Vitals shown include unvalidated device data.  Last Pain:  Vitals:   08/24/20 1516  TempSrc: Oral  PainSc:          Complications: No complications documented.

## 2020-08-25 ENCOUNTER — Encounter: Payer: Self-pay | Admitting: Surgery

## 2020-08-25 ENCOUNTER — Other Ambulatory Visit: Payer: Self-pay

## 2020-08-25 MED ORDER — HYDROCODONE-ACETAMINOPHEN 5-325 MG PO TABS: 1.0000 | ORAL_TABLET | Freq: Four times a day (QID) | ORAL | 0 refills | Status: DC | PRN

## 2020-08-25 MED ORDER — ONDANSETRON HCL 4 MG PO TABS: 4.0000 mg | ORAL_TABLET | Freq: Four times a day (QID) | ORAL | 0 refills | Status: DC | PRN

## 2020-08-25 NOTE — Progress Notes (Signed)
Pt IV removed, some bleeding at site, but changed gauze and bleeding resolved. Pt dressing to L arm c,d,i. Pt aware of scripts at pharmacy already. Pt has all belongings, awaiting ride. D/c education given and all questions answered.

## 2020-08-25 NOTE — Evaluation (Signed)
Occupational Therapy Evaluation Patient Details Name: Ashley Glover MRN: [id number] DOB: 04-Mar-1961 Today's Date: 08/25/2020    History of Present Illness Pt is a 60 y/o F who was walking to her car & rolled her ankle causing her to fall. She attempted to catch herself but fell on outstretched L hand. Pt found to have L radial & ulnar fx. Pt underwent ORIF of L distal radial shaft fx & percutaneous pinning of unstable ulnar fx by Dr. Roland Rack on 08/24/20. PMH: GERD, degenerative arthritis, frequent PVCs, essential HTN, hypothyroidism, fibromyalgia, Sjogren's syndrome, primary OA of B knees, HLD, osteopenia   Clinical Impression   Pt was seen for OT evaluation this date. Prior to hospital admission, pt was independent.  Pt lives alone and has a couple neighbors she thinks can assist PRN. Currently pt demonstrates impairments as described below (See OT problem list) which functionally limit her ability to perform ADL/self-care tasks. Pt currently requires assist for her sling, mod indep with dressing, bathing, toileting. Pt educated in hemi techniques for ADL, falls, AE/DME, and sling mgt and NWB.  Upon hospital discharge, recommend follow up per MD to maximize pt safety and return to PLOF.      Follow Up Recommendations  Supervision - Intermittent;Follow surgeon's recommendation for DC plan and follow-up therapies    Equipment Recommendations  None recommended by OT    Recommendations for Other Services       Precautions / Restrictions Precautions Precautions: Fall Required Braces or Orthoses:  (LUE immobilizer) Restrictions Weight Bearing Restrictions: Yes LUE Weight Bearing: Non weight bearing      Mobility Bed Mobility Overal bed mobility: Modified Independent Bed Mobility: Supine to Sit     Supine to sit: Modified independent (Device/Increase time);HOB elevated     General bed mobility comments: use of bed rails    Transfers Overall transfer level: Needs  assistance Equipment used: None Transfers: Sit to/from Stand Sit to Stand: Supervision         General transfer comment: increased time/effort    Balance Overall balance assessment: Needs assistance Sitting-balance support: No upper extremity supported Sitting balance-Leahy Scale: Normal     Standing balance support: No upper extremity supported;During functional activity Standing balance-Leahy Scale: Good                             ADL either performed or assessed with clinical judgement   ADL Overall ADL's : Modified independent                                             Vision Patient Visual Report: No change from baseline       Perception     Praxis      Pertinent Vitals/Pain Pain Assessment: 0-10 Pain Score: 7  Pain Location: L wrist, R knee & ankle Pain Descriptors / Indicators: Aching Pain Intervention(s): Limited activity within patient's tolerance;Monitored during session;Repositioned;Patient requesting pain meds-RN notified     Hand Dominance Right   Extremity/Trunk Assessment Upper Extremity Assessment Upper Extremity Assessment: LUE deficits/detail LUE Deficits / Details: in immobilizer LUE: Unable to fully assess due to pain;Unable to fully assess due to immobilization   Lower Extremity Assessment Lower Extremity Assessment: Overall WFL for tasks assessed;Generalized weakness   Cervical / Trunk Assessment Cervical / Trunk Assessment: Normal   Communication Communication Communication: No difficulties  Cognition Arousal/Alertness: Awake/alert Behavior During Therapy: WFL for tasks assessed/performed Overall Cognitive Status: Within Functional Limits for tasks assessed                                     General Comments  Educated pt on need to reside on main floor and have someone retrieve necessary items from upstairs PRN since pt reports she heavily relies on rail to pull herself up stairs,  discussed no current PT needs    Exercises Other Exercises Other Exercises: pt educated in hemi techniques for ADL, falls, AE/DME, and sling mgt and NWB   Shoulder Instructions      Home Living Family/patient expects to be discharged to:: Private residence Living Arrangements: Alone Available Help at Discharge: Neighbor;Friend(s) (can assist very sparingly) Type of Home: House Home Access: Stairs to enter Entrance Stairs-Number of Steps: 2 +1 at front door Entrance Stairs-Rails:  (no formal rails but has post for RUE support on B sides) Home Layout: Two level;Able to live on main level with bedroom/bathroom Alternate Level Stairs-Number of Steps: 1/2 flight + landing + 1/2 flight Alternate Level Stairs-Rails: Right Bathroom Shower/Tub: Teacher, early years/pre: Standard     Home Equipment: Environmental consultant - 2 wheels;Cane - quad;Cane - single point;Adaptive equipment Adaptive Equipment: Reacher        Prior Functioning/Environment Level of Independence: Independent with assistive device(s)        Comments: reports only this fall in past 6 months, notes she relies heavily on UE support 2/2 hx of hypermobility & affected joints (notes she's received injections since she was 60 years old)        OT Problem List: Decreased strength;Decreased range of motion;Pain;Decreased knowledge of use of DME or AE;Impaired UE functional use      OT Treatment/Interventions:      OT Goals(Current goals can be found in the care plan section) Acute Rehab OT Goals Patient Stated Goal: go home OT Goal Formulation: All assessment and education complete, DC therapy  OT Frequency:     Barriers to D/C:            Co-evaluation              AM-PAC OT "6 Clicks" Daily Activity     Outcome Measure Help from another person eating meals?: None Help from another person taking care of personal grooming?: None Help from another person toileting, which includes using toliet, bedpan, or  urinal?: None Help from another person bathing (including washing, rinsing, drying)?: A Little Help from another person to put on and taking off regular upper body clothing?: A Little Help from another person to put on and taking off regular lower body clothing?: A Little 6 Click Score: 21   End of Session    Activity Tolerance: Patient tolerated treatment well Patient left: in chair;with nursing/sitter in room  OT Visit Diagnosis: Other abnormalities of gait and mobility (R26.89);Pain;Muscle weakness (generalized) (M62.81) Pain - Right/Left: Left Pain - part of body: Arm                Time: 4403-4742 OT Time Calculation (min): 24 min Charges:  OT General Charges $OT Visit: 1 Visit OT Evaluation $OT Eval Low Complexity: 1 Low OT Treatments $Self Care/Home Management : 8-22 mins  Hanley Hays, MPH, MS, OTR/L ascom [phone] 08/25/20, 1:22 PM

## 2020-08-25 NOTE — Discharge Summary (Signed)
Physician Discharge Summary  Patient ID: Ashley Glover MRN: [id number] DOB/AGE: [birth year] 60 y.o.  Admit date: 08/24/2020 Discharge date: 08/25/2020  Admission Diagnoses:  Distal radius fracture, left [S52.502A] Closed fracture of distal ends of left radius and ulna, initial encounter [S52.502A, S52.602A]  Surgeries:Procedure(s): Open reduction and internal fixation of left distal radial shaft fracture, closed reduction and percutaneous pinning of unstable distal ulnar fracture.  Surgeon:   Pascal Lux, MD  Assistant:   None  Anesthesia:   GET  Findings:   As above.  Complications:   None  Fluids:   1000 cc crystalloid  EBL:   10 cc  UOP:   None  TT:   89 minutes at 250 mmHg  Drains:   None  Closure:   Staples for volar incision and 4-0 Prolene interrupted sutures were percutaneous pin incisions  Implants:   [employer] 6-hole 3.5 compression plate and two 0.175 K wires  Discharge Diagnoses: Patient Active Problem List   Diagnosis Date Noted  . Distal radius fracture, left 08/24/2020  . S/P total hysterectomy 03/16/2020  . Acute gastritis without hemorrhage   . Dermatitis of both ear canals 03/01/2020  . Pruritus 01/25/2020  . Gastroesophageal reflux disease with esophagitis without hemorrhage   . Personal history of colonic polyps   . Polyp of ascending colon   . Insomnia 06/25/2019  . Osteopenia determined by x-ray 06/23/2018  . Chronic otitis media 09/30/2017  . Migraine without aura and without status migrainosus, not intractable 05/21/2017  . Not currently working due to disabled status 01/24/2017  . Degenerative arthritis 01/24/2017  . Frequent PVCs 08/20/2016  . Hx of colonic polyps 04/24/2016  . Periodic headache syndrome, not intractable 01/31/2016  . Rosacea, acne 01/31/2016  . Essential hypertension 08/02/2015  . Hypothyroidism due to acquired atrophy of thyroid 08/02/2015  . Fibromyalgia 08/02/2015  . Sjogren's syndrome  ([place]) 08/02/2015  . Peptic ulcer disease 08/02/2015  . Primary osteoarthritis of both knees 08/02/2015  . Osteopenia 08/02/2015  . Other allergic rhinitis 08/02/2015  . Hyperlipidemia, mixed 08/02/2015    Past Medical History:  Diagnosis Date  . Allergic rhinitis   . Benign neoplasm of ascending colon   . Benign neoplasm of cecum   . Degenerative joint disease   . Fibromyalgia   . GERD (gastroesophageal reflux disease)   . Hx of abnormal cervical Pap smear 08/02/2015   1990's - had partial hysterectomy for dysplasia and HPV   . Hyperlipidemia   . Hypertension   . Hypothyroid   . Mitral valve prolapse    states it is mild and does not see a cardiologist  . Redness of both eyes 09/30/2017  . Sjogrens syndrome ([place])      Transfusion: n/a   Consultants (if any):   Discharged Condition: Improved  Hospital Course: N[facility]I Ashley Glover is an 60 y.o. female who was admitted 08/24/2020 with a diagnosis of distal radius fracture and went to the operating room on 08/24/2020 and underwent open reduction and internal fixation of left distal radial shaft fracture, closed reduction and percutaneous pinning of unstable distal ulnar fracture. The patient received perioperative antibiotics for prophylaxis (see below). The patient tolerated the procedure well and was transported to PACU in stable condition. After meeting PACU criteria, the patient was subsequently transferred to the Orthopaedics/Rehabilitation unit.   The patient received DVT prophylaxis in the form of early mobilization, Lovenox. A sacral pad had been placed and heels were elevated off of the bed with rolled towels in  order to protect skin integrity. Foley catheter was discontinued on postoperative day #0.   Physical therapy was initiated postoperatively.  Occupational therapy was initiated for activities of daily living and evaluation for assisted devices.  The patient achieved the preliminary goals of this hospitalization and was felt to  be medically and orthopaedically appropriate for discharge.  She was given perioperative antibiotics:  Anti-infectives (From admission, onward)   None    .  Recent vital signs:  Vitals:   08/25/20 0540 08/25/20 0815  BP: 112/77 107/77  Pulse: 78 70  Resp: 17 16  Temp: 98 F (36.7 C) 97.7 F (36.5 C)  SpO2: 96% 97%    Recent laboratory studies:  Recent Labs    08/24/20 1344  WBC 13.4*  HGB 14.7  HCT 46.1*  PLT 270  K 3.8  CL 100  CO2 26  BUN 15  CREATININE 0.97  GLUCOSE 125*  CALCIUM 9.8    Diagnostic Studies: DG Forearm Left  Result Date: 08/24/2020 CLINICAL DATA:  Status post reduction and splinting distal radius and ulnar fractures EXAM: LEFT FOREARM - 2 VIEW COMPARISON:  Forearm radiographs 08/24/2020 FINDINGS: Significant interval improvement of osseous alignment. Mild lateral and dorsal displacement of the radial fracture fragment still remains. Interval placement of cast. IMPRESSION: Interval improvement in alignment of the distal radius and ulna fracture status post reduction. Electronically Signed   By: Miachel Roux M.D.   On: 08/24/2020 16:00   DG Forearm Left  Result Date: 08/24/2020 CLINICAL DATA:  Fall, left forearm deformity EXAM: LEFT FOREARM - 2 VIEW COMPARISON:  None. FINDINGS: Acute transversely oriented fracture through the distal left radial diaphysis. Moderate displacement with approximately 1 shaft width of dorsal and radial displacement. Moderate anterior apex angulation. There is also a moderately displaced fracture through the distal ulnar metaphysis with dorsal-radial displacement. Distal radioulnar joint appears disrupted. Radiocarpal osteoarthritis. No radiocarpal dislocation. Diffuse soft tissue swelling at the fracture site. IMPRESSION: 1. Acute moderately displaced and angulated fracture through the distal left radial diaphysis. 2. Moderately displaced fracture through the distal ulnar metaphysis. Electronically Signed   By: Davina Poke  D.O.   On: 08/24/2020 14:37   DG MINI C-ARM IMAGE ONLY  Result Date: 08/24/2020 There is no interpretation for this exam.  This order is for images obtained during a surgical procedure.  Please See "Surgeries" Tab for more information regarding the procedure.    Discharge Medications:   Allergies as of 08/25/2020      Reactions   Ace Inhibitors Cough   Morphine And Related Nausea Only   Savella [milnacipran Hcl] Palpitations      Medication List    TAKE these medications   albuterol 108 (90 Base) MCG/ACT inhaler Commonly known as: VENTOLIN HFA Inhale 2 puffs into the lungs every 6 (six) hours as needed for wheezing or shortness of breath.   aspirin 81 MG EC tablet Take 81 mg by mouth daily. Swallow whole.   butalbital-acetaminophen-caffeine 50-325-40-30 MG capsule Commonly known as: FIORICET WITH CODEINE TAKE 1 CAPSULE BY MOUTH EVERY 6 (SIX) HOURS AS NEEDED FOR HEADACHE.   calcium-vitamin D 250-125 MG-UNIT tablet Commonly known as: OSCAL WITH D Take 1 tablet by mouth daily.   DULoxetine 60 MG capsule Commonly known as: CYMBALTA TAKE 1 CAPSULE BY MOUTH  DAILY   Fish Oil 1000 MG Caps Take by mouth.   furosemide 40 MG tablet Commonly known as: LASIX Take 1 tablet (40 mg total) by mouth daily.   gabapentin 600 MG  tablet Commonly known as: NEURONTIN TAKE 1 TABLET BY MOUTH 3  TIMES DAILY   hydrochlorothiazide 25 MG tablet Commonly known as: HYDRODIURIL Take 12.5 mg by mouth daily.   HYDROcodone-acetaminophen 5-325 MG tablet Commonly known as: Norco Take 1 tablet by mouth every 6 (six) hours as needed for moderate pain.   hydrocortisone 2.5 % cream APPLY TOPICALLY TWO TIMES   DAILY   levothyroxine 88 MCG tablet Commonly known as: SYNTHROID TAKE 1 TABLET BY MOUTH  DAILY BEFORE BREAKFAST   loratadine 10 MG tablet Commonly known as: CLARITIN Take 10 mg by mouth daily.   mometasone 0.1 % lotion Commonly known as: ELOCON Apply topically daily. To both ears as  needed   montelukast 10 MG tablet Commonly known as: SINGULAIR TAKE 1 TABLET BY MOUTH AT  BEDTIME   MULTIVITAMIN ADULT PO Take 1 tablet by mouth daily.   ondansetron 4 MG tablet Commonly known as: ZOFRAN Take 1 tablet (4 mg total) by mouth every 6 (six) hours as needed for nausea.   pantoprazole 40 MG tablet Commonly known as: PROTONIX TAKE 1 TABLET BY MOUTH  TWICE DAILY   promethazine 25 MG tablet Commonly known as: PHENERGAN TAKE 1 TABLET (25 MG TOTAL) BY MOUTH EVERY 8 (EIGHT) HOURS AS NEEDED FOR NAUSEA OR VOMITING.   propranolol 40 MG tablet Commonly known as: INDERAL TAKE 1 TABLET BY MOUTH  TWICE DAILY   rosuvastatin 20 MG tablet Commonly known as: CRESTOR Take 20 mg by mouth at bedtime.   tiZANidine 4 MG tablet Commonly known as: ZANAFLEX TAKE 1 TABLET BY MOUTH 3  TIMES DAILY   tobramycin-dexamethasone ophthalmic solution Commonly known as: TOBRADEX Place 1 drop into both eyes every 6 (six) hours.   zolpidem 6.25 MG CR tablet Commonly known as: Ambien CR Take 1 tablet (6.25 mg total) by mouth at bedtime as needed for sleep.       Disposition: Discharge disposition: 01-Home or Self Care          Follow-up Information    Lattie Corns, PA-C. Go on 09/04/2020.   Specialty: Physician Assistant Why: Call and verify appointment with Cameron Proud, PA-C Contact information: [place] [id number] [place], PA-C 08/25/2020, 9:41 AM

## 2020-08-25 NOTE — Progress Notes (Signed)
  Subjective: 1 Day Post-Op Procedure(s) (LRB): OPEN REDUCTION INTERNAL FIXATION (ORIF) DISTAL RADIAL FRACTURE (Left) Patient reports pain as well-controlled.   Patient is well, and has had no acute complaints or problems Plan is to go Home after hospital stay. Negative for chest pain and shortness of breath Fever: no Gastrointestinal: negative for nausea and vomiting.    Objective: Vital signs in last 24 hours: Temp:  [97.7 F (36.5 C)-98.4 F (36.9 C)] 97.7 F (36.5 C) (04/01 0815) Pulse Rate:  [70-94] 70 (04/01 0815) Resp:  [11-21] 16 (04/01 0815) BP: (107-151)/(77-119) 107/77 (04/01 0815) SpO2:  [91 %-99 %] 97 % (04/01 0815) Weight:  [86.2 kg] 86.2 kg (03/31 1338)  Intake/Output from previous day:  Intake/Output Summary (Last 24 hours) at 08/25/2020 0933 Last data filed at 08/25/2020 0300 Gross per 24 hour  Intake 1597.48 ml  Output 10 ml  Net 1587.48 ml    Intake/Output this shift: No intake/output data recorded.  Labs: Recent Labs    08/24/20 1344  HGB 14.7   Recent Labs    08/24/20 1344  WBC 13.4*  RBC 5.16*  HCT 46.1*  PLT 270   Recent Labs    08/24/20 1344  NA 139  K 3.8  CL 100  CO2 26  BUN 15  CREATININE 0.97  GLUCOSE 125*  CALCIUM 9.8   No results for input(s): LABPT, INR in the last 72 hours.   EXAM General - Patient is Alert, Appropriate and Oriented Extremity - immobilizer in place,  Neurovascular intact, able to make thumbs up, criss cross second and third digits, and make an OK sign, sensation intact over exposed arm and hand with some slight paresthesias noted over ulnar nerve distribution, capillary refill less than 2 seconds Dressing/Incision -post-op dressing in place with no drainage noted  Motor Function - intact, able to flex and extend all fingers Cardiovascular- Regular rate and rhythm, no murmurs/rubs/gallops Respiratory- Lungs clear to auscultation bilaterally Gastrointestinal- soft, nontender and active bowel  sounds   Assessment/Plan: 1 Day Post-Op Procedure(s) (LRB): OPEN REDUCTION INTERNAL FIXATION (ORIF) DISTAL RADIAL FRACTURE (Left) Active Problems:   Distal radius fracture, left  Estimated body mass index is 32.61 kg/m as calculated from the following:   Height as of this encounter: 5\' 4"  (1.626 m).   Weight as of this encounter: 86.2 kg. Advance diet Up with therapy  Plan for d/c after PT this AM.   DVT Prophylaxis - [place], PA-C [facility] Orthopaedic Surgery 08/25/2020, 9:33 AM

## 2020-08-25 NOTE — Discharge Instructions (Signed)
Closed Reduction for Wrist or Forearm, Care After This sheet gives you information about how to care for yourself after your procedure. Your health care provider may also give you more specific instructions. If you have problems or questions, contact your health care provider. What can I expect after the procedure? After the procedure, it is common to have:  Pain.  Swelling. Follow these instructions at home: If you have a splint:  Wear the splint as told by your health care provider. Remove it only as told by your health care provider.  Loosen the splint if your fingers tingle, become numb, or turn cold and blue.  Keep the splint clean.  If the splint is not waterproof: ? Do not let it get wet. ? Cover it with a watertight covering when you take a bath or shower.   If you have a cast:  Do not stick anything inside the cast to scratch your skin. Doing that increases your risk of infection.  Check the skin around the cast every day. Tell your health care provider about any concerns.  You may put lotion on dry skin around the edges of the cast. Do not put lotion on the skin underneath the cast.  Keep the cast clean.  If the cast is not waterproof: ? Do not let it get wet. ? Cover it with a watertight covering when you take a bath or shower. Managing pain, stiffness, and swelling  If directed, put ice on the injured area. To do this: ? If you have a removable splint, remove it as told by your health care provider. ? Put ice in a plastic bag. ? Place a towel between your skin and the bag or between your cast and the bag. ? Leave the ice on for 20 minutes, 2-3 times per day.  Move your fingers often to reduce stiffness and swelling.  Raise (elevate) the injured area above the level of your heart while you are sitting or lying down.   Driving  Ask your health care provider if the medicine prescribed to you requires you to avoid driving or using heavy machinery.  Do not drive  for 24 hours if you were given a sedative during your procedure.  Ask your health care provider when it is safe to drive if you have a cast or splint on your arm. Activity  Return to your normal activities as told by your health care provider. Ask your health care provider what activities are safe for you.  Do exercises as told by your health care provider. General instructions  Do not put pressure on any part of the cast or splint until it is fully hardened. This may take several hours.  Take over-the-counter and prescription medicines only as told by your health care provider.  Do not use any products that contain nicotine or tobacco, such as cigarettes, e-cigarettes, and chewing tobacco. These can delay bone healing. If you need help quitting, ask your health care provider.  Keep all follow-up visits as told by your health care provider. This is important. Contact a health care provider if:  You have a fever.  Your pain is not controlled by your pain medicine. Get help right away if:  You have severe pain.  You have a severe increase in swelling.  Your fingers become very cold or blue.  You have numbness, tingling, or loss of feeling in your hands or fingers. Summary  After the procedure, it is common to have pain and swelling.  Return to your normal activities as told by your health care provider. Ask your health care provider what activities are safe for you.  Get help right away if you have a severe increase in swelling, your fingers become very cold or blue, or you have numbness, tingling, or loss of feeling in your hands or fingers. This information is not intended to replace advice given to you by your health care provider. Make sure you discuss any questions you have with your health care provider. Document Revised: 12/03/2018 Document Reviewed: 12/03/2018 Elsevier Patient Education  [place].   Forearm Fracture Rehab Ask your health care provider which  exercises are safe for you. Do exercises exactly as told by your health care provider and adjust them as directed. It is normal to feel mild stretching, pulling, tightness, or discomfort as you do these exercises. Stop right away if you feel sudden pain or your pain gets worse. Do not begin these exercises until told by your health care provider. Stretching and range-of-motion exercises These exercises warm up your muscles and joints and improve the movement and flexibility of your wrist and forearm. The exercises also help to relieve pain and stiffness. Wrist flexion 1. Stand or sit with your left / right elbow bent to a 90-degree angle (right angle) at your side and your palm facing the floor. 2. Slowly bend your wrist so that your fingers move toward the floor (flexion), stopping when you feel a gentle stretch over the back of your forearm. 3. Hold this position for _________ seconds. 4. Return to the starting position. Repeat __________ times. Complete this exercise __________ times a day. Wrist extension 1. Stand or sit with your left / right elbow bent to a 90-degree angle (right angle) at your side and your palm facing the floor. 2. Slowly lift your wrist up so that your fingers move toward the ceiling (extension), stopping when you feel a gentle stretch over the bottom of your forearm. 3. Hold this position for _________ seconds. 4. Return to the starting position. Repeat __________ times. Complete this exercise __________ times a day. Forearm rotation, supination 1. Stand or sit with your left / right elbow bent to a 90-degree angle (right angle) at your side. Position your forearm so that the thumb is facing the ceiling (neutral position). 2. Turn (rotate) your palm up to the ceiling (supination), stopping when you feel a gentle stretch. 3. Hold this position for _________ seconds. 4. Return to the starting position. Repeat __________ times. Complete this exercise __________ times a  day. Forearm rotation, pronation 1. Stand or sit with your left / right elbow bent to a 90-degree angle (right angle) at your side. Position your forearm so that the thumb is facing the ceiling (neutral position). 2. Rotate your palm down toward the floor (pronation), stopping when you feel a gentle stretch. 3. Hold this position for _________ seconds. 4. Return to the starting position. Repeat __________ times. Complete this exercise __________ times a day. Wrist flexion, assisted 1. Extend your left / right arm in front of you, and turn your palm down toward the floor. ? If told by your health care provider, bend your left / right elbow to a 90-degree angle (right angle) at your side. 2. Using your uninjured hand, gently press over the back of your left / right hand to bend your wrist and fingers toward the floor (assisted flexion). Go as far as you can to feel a stretch without causing pain. 3. Hold  this position for __________ seconds. 4. Return to the starting position. Repeat __________ times. Complete this exercise __________ times a day.   Wrist extension, assisted 1. Extend your left / right arm in front of you and turn your palm up toward the ceiling. ? If told by your health care provider, bend your left / right elbow to a 90-degree angle (right angle) at your side. 2. Using your uninjured hand, gently pull your palm and fingers of your left / right hand to bend your wrist and fingers toward the floor (assisted extension). Go as far as you can to feel a stretch without causing pain. 3. Hold this position for __________ seconds. 4. Return to the starting position. Repeat __________ times. Complete this exercise __________ times a day.   Assisted forearm rotation, supination 1. Stand or sit with your elbows at your sides. 2. Bend your left / right elbow to a 90-degree angle (right angle). 3. Using your uninjured hand, turn your left / right palm up toward the ceiling (assisted  supination) until you feel a gentle stretch in the inside of your forearm. 4. Hold this position for __________ seconds. 5. Return to the starting position. Repeat __________ times. Complete this exercise __________ times a day. Assisted forearm rotation, pronation 1. Stand or sit with your arms at your sides. 2. Bend your left / right elbow to a 90-degree angle (right angle). 3. Using your uninjured hand, turn your palm down toward the floor (assisted pronation) until you feel a gentle stretch in the top of your forearm. 4. Hold this position for __________ seconds. 5. Return to the starting position. Repeat __________ times. Complete this exercise __________ times a day. Strengthening exercises These exercises build strength and endurance in your forearm. Endurance is the ability to use your muscles for a long time, even after they get tired. Grip strengthening 1. Hold one of these items in your left / right hand: a dense sponge, a stress ball, or a large, rolled sock. 2. Slowly squeeze the object as hard as you can without increasing any pain. 3. Hold your squeeze for __________ seconds. Slowly release your grip. Repeat __________ times. Complete this exercise __________ times a day.   Wrist flexion 1. Sit with your left / right forearm supported on a table. Your elbow should be at waist height. 2. Rest your hand over the edge of the table, palm up. 3. Gently grasp a __________ lb (kg) weight or a similar object. As this exercise gets easier for you, you may be asked to use more and more weight. 4. Without moving your forearm or elbow, slowly bend your wrist up toward the ceiling (wrist flexion). 5. Hold this position for __________ seconds. 6. Slowly return to the starting position. Repeat __________ times. Complete this exercise __________ times a day. Wrist extension 1. Sit with your left / right forearm supported on a table. Your elbow should be at waist height. 2. Rest your hand  over the edge of the table, palm down. 3. Gently grasp a __________ lb (kg) weight or a similar object. As this exercise gets easier for you, you may be asked to use more and more weight. 4. Without moving your forearm or elbow, slowly bend your wrist up toward the ceiling (extension). 5. Hold this position for __________ seconds. 6. Slowly return to the starting position. Repeat __________ times. Complete this exercise __________ times a day. Forearm rotation, supination 1. Sit with your left / right forearm supported on a  table. Your elbow should be at waist height. 2. Rest your hand over the edge of the table, palm down. 3. Gently grasp a lightweight hammer near the head. As this exercise gets easier for you, try holding the hammer farther down the handle. 4. Without moving your elbow, slowly rotate your palm up toward the ceiling (supination). 5. Hold this position for __________ seconds. 6. Slowly return to the starting position. Repeat __________ times. Complete this exercise __________ times a day.   Forearm rotation, pronation 1. Sit with your left / right forearm supported on a table. Your elbow should be at waist height. 2. Rest your hand over the edge of the table, palm up. 3. Gently grasp a lightweight hammer near the head. As this exercise gets easier for you, try holding the hammer farther down the handle. 4. Without moving your elbow, slowly rotate your palm down toward the floor (pronation). 5. Hold this position for __________ seconds. 6. Slowly return to the starting position. Repeat __________ times. Complete this exercise __________ times a day.   This information is not intended to replace advice given to you by your health care provider. Make sure you discuss any questions you have with your health care provider. Document Revised: 09/03/2018 Document Reviewed: 06/18/2018 Elsevier Patient Education  [place].

## 2020-08-25 NOTE — Evaluation (Signed)
Physical Therapy Evaluation Patient Details Name: Ashley Glover MRN: [id number] DOB: April 11, 1961 Today's Date: 08/25/2020   History of Present Illness  Pt is a 60 y/o F who was walking to her car & rolled her ankle causing her to fall. She attempted to catch herself but fell on outstretched L hand. Pt found to have L radial & ulnar fx. Pt underwent ORIF of L distal radial shaft fx & percutaneous pinning of unstable ulnar fx by Dr. Roland Rack on 08/24/20. PMH: GERD, degenerative arthritis, frequent PVCs, essential HTN, hypothyroidism, fibromyalgia, Sjogren's syndrome, primary OA of B knees, HLD, osteopenia  Clinical Impression  Pt seen for PT evaluation with PT educating her on NWB LUE with pt wearing UE immobilizer throughout. Pt reports chronic hx of hypermobility, wears BLE ankle braces, and receives injections often. Pt is able to ambulate short distance in room without AD & CGA with increased lateral instability. Provided pt with QC & pt able to ambulate room<>gym with supervision with improved balance. Pt negotiates 4 steps with RUE support and supervision. At this time pt does not demonstrate acute PT needs, anticipate pt will progress to mod I with QC as she increases activity (feel as though pt may have been slightly unsteady on feet prior to admission 2/2 chronic issues). Pt voices no concerns re: d/c home but PT did encourage her to have someone present & assist with retrieving items from upstairs as needed. Will sign off at this time, please re-consult if new needs arise.     Follow Up Recommendations No PT follow up    Equipment Recommendations  None recommended by PT (pt already has DME)    Recommendations for Other Services       Precautions / Restrictions Precautions Precautions: Fall Required Braces or Orthoses:  (LUE immobilizer) Restrictions Weight Bearing Restrictions: Yes LUE Weight Bearing: Non weight bearing      Mobility  Bed Mobility Overal bed mobility: Modified  Independent Bed Mobility: Supine to Sit     Supine to sit: Modified independent (Device/Increase time);HOB elevated     General bed mobility comments: use of bed rails    Transfers Overall transfer level: Needs assistance   Transfers: Sit to/from Stand Sit to Stand: Min guard;Supervision         General transfer comment: CGA fade to supervision, slightly extra time to power up, uses RUE to push to standing  Ambulation/Gait Ambulation/Gait assistance: Supervision Gait Distance (Feet): 30 Feet (150 ft + 100 ft) Assistive device: None;Quad cane Gait Pattern/deviations: Decreased step length - left;Decreased step length - right;Decreased stride length Gait velocity: decreased   General Gait Details: 30 ft in room without AD & CGA, additional gait with QC & supervision, increased weight shift to R  Stairs Stairs: Yes Stairs assistance: Supervision (min guard x 1 step when descending but pt able to change giat pattern with improved balance & otherwise required supervision) Stair Management: One rail Right Number of Stairs: 4 (6") General stair comments: RUE support while ascending & descending; pt elects to ascend & descend with LLE leading  Wheelchair Mobility    Modified Rankin (Stroke Patients Only)       Balance Overall balance assessment: Needs assistance Sitting-balance support: No upper extremity supported Sitting balance-Leahy Scale: Normal     Standing balance support: No upper extremity supported;During functional activity Standing balance-Leahy Scale: Good  Pertinent Vitals/Pain Pain Assessment: 0-10 Pain Score: 7  Pain Location: L wrist, R knee & ankle Pain Descriptors / Indicators: Aching Pain Intervention(s): Limited activity within patient's tolerance;Monitored during session (pt notes she's premedicated)    Home Living Family/patient expects to be discharged to:: Private residence Living Arrangements:  Alone Available Help at Discharge: Neighbor;Friend(s) (can assist very sparingly) Type of Home: House Home Access: Stairs to enter Entrance Stairs-Rails:  (no formal rails but has post for RUE support on B sides) Entrance Stairs-Number of Steps: 2 +1 at front door Home Layout: Two level;Able to live on main level with bedroom/bathroom Home Equipment: Gilford Rile - 2 wheels;Cane - quad;Cane - single point      Prior Function Level of Independence: Independent with assistive device(s)         Comments: reports only this fall in past 6 months, notes she relies heavily on UE support 2/2 hx of hypermobility & affected joints (notes she's received injections since she was 60 years old)     Hand Dominance   Dominant Hand: Right    Extremity/Trunk Assessment   Upper Extremity Assessment Upper Extremity Assessment: LUE deficits/detail LUE Deficits / Details: in immobilizer    Lower Extremity Assessment Lower Extremity Assessment: Overall WFL for tasks assessed;Generalized weakness       Communication   Communication: No difficulties  Cognition Arousal/Alertness: Awake/alert Behavior During Therapy: WFL for tasks assessed/performed Overall Cognitive Status: Within Functional Limits for tasks assessed                                        General Comments General comments (skin integrity, edema, etc.): Educated pt on need to reside on main floor and have someone retrieve necessary items from upstairs PRN since pt reports she heavily relies on rail to pull herself up stairs, discussed no current PT needs    Exercises     Assessment/Plan    PT Assessment Patent does not need any further PT services  PT Problem List         PT Treatment Interventions      PT Goals (Current goals can be found in the Care Plan section)  Acute Rehab PT Goals Patient Stated Goal: go home PT Goal Formulation: With patient Time For Goal Achievement: 09/08/20 Potential to Achieve  Goals: Good    Frequency     Barriers to discharge Decreased caregiver support      Co-evaluation               AM-PAC PT "6 Clicks" Mobility  Outcome Measure Help needed turning from your back to your side while in a flat bed without using bedrails?: None Help needed moving from lying on your back to sitting on the side of a flat bed without using bedrails?: None Help needed moving to and from a bed to a chair (including a wheelchair)?: A Little Help needed standing up from a chair using your arms (e.g., wheelchair or bedside chair)?: A Little Help needed to walk in hospital room?: A Little Help needed climbing 3-5 steps with a railing? : A Little 6 Click Score: 20    End of Session Equipment Utilized During Treatment: Gait belt (LUE in sling) Activity Tolerance: Patient tolerated treatment well Patient left: in chair;with call bell/phone within reach;with chair alarm set Nurse Communication: Mobility status      Time: 0240-9735 PT Time Calculation (min) (ACUTE ONLY):  30 min   Charges:   PT Evaluation $PT Eval Low Complexity: 1 Low PT Treatments $Gait Training: 8-22 mins        Lavone Nian, PT, DPT 08/25/20, 10:56 AM   Waunita Schooner 08/25/2020, 10:52 AM

## 2020-09-04 DIAGNOSIS — S52692D Other fracture of lower end of left ulna, subsequent encounter for closed fracture with routine healing: Secondary | ICD-10-CM | POA: Diagnosis not present

## 2020-09-04 DIAGNOSIS — S52592D Other fractures of lower end of left radius, subsequent encounter for closed fracture with routine healing: Secondary | ICD-10-CM | POA: Diagnosis not present

## 2020-09-18 DIAGNOSIS — S52592D Other fractures of lower end of left radius, subsequent encounter for closed fracture with routine healing: Secondary | ICD-10-CM | POA: Diagnosis not present

## 2020-09-18 DIAGNOSIS — S52692D Other fracture of lower end of left ulna, subsequent encounter for closed fracture with routine healing: Secondary | ICD-10-CM | POA: Diagnosis not present

## 2020-09-19 ENCOUNTER — Other Ambulatory Visit: Payer: Self-pay | Admitting: Internal Medicine

## 2020-09-19 DIAGNOSIS — I10 Essential (primary) hypertension: Secondary | ICD-10-CM

## 2020-09-20 NOTE — Telephone Encounter (Signed)
Requested Prescriptions  Pending Prescriptions Disp Refills  . propranolol (INDERAL) 40 MG tablet [Pharmacy Med Name: PROPRANOLOL  40MG   TAB] 180 tablet 1    Sig: TAKE 1 TABLET BY MOUTH  TWICE DAILY     Cardiovascular:  Beta Blockers Failed - 09/19/2020  9:35 PM      Failed - Last BP in normal range    BP Readings from Last 1 Encounters:  08/25/20 (!) 123/93         Passed - Last Heart Rate in normal range    Pulse Readings from Last 1 Encounters:  08/25/20 75         Passed - Valid encounter within last 6 months    Recent Outpatient Visits          2 months ago Annual physical exam   [facility] Glean Hess, MD   6 months ago Cellulitis of right upper extremity   [facility] Glean Hess, MD   7 months ago Hyperlipidemia, mixed   [facility] Glean Hess, MD   1 year ago Annual physical exam   [facility] Glean Hess, MD   1 year ago Essential hypertension   [facility] Glean Hess, MD      Future Appointments            In 2 months Army Melia Jesse Sans, MD [facility], [place]   In 9 months Army Melia Jesse Sans, MD [facility], [facility]

## 2020-09-22 ENCOUNTER — Other Ambulatory Visit: Payer: Self-pay | Admitting: Internal Medicine

## 2020-09-22 DIAGNOSIS — M797 Fibromyalgia: Secondary | ICD-10-CM

## 2020-09-22 NOTE — Telephone Encounter (Signed)
Requested medication (s) are due for refill today: yes  Requested medication (s) are on the active medication list: yes  Last refill: yes  Future visit scheduled: yes  Notes to clinic: not delegated    Requested Prescriptions  Pending Prescriptions Disp Refills   tiZANidine (ZANAFLEX) 4 MG tablet [Pharmacy Med Name: tiZANidine HCl 4 MG Oral Tablet] 270 tablet 0    Sig: TAKE 1 TABLET BY MOUTH 3  TIMES DAILY      Not Delegated - Cardiovascular:  Alpha-2 Agonists - tizanidine Failed - 09/22/2020 11:49 AM      Failed - This refill cannot be delegated      Passed - Valid encounter within last 6 months    Recent Outpatient Visits           2 months ago Annual physical exam   [facility] Glean Hess, MD   6 months ago Cellulitis of right upper extremity   [facility] Glean Hess, MD   8 months ago Hyperlipidemia, mixed   [facility] Glean Hess, MD   1 year ago Annual physical exam   [facility] Glean Hess, MD   1 year ago Essential hypertension   [facility] Glean Hess, MD       Future Appointments             In 2 months Army Melia Jesse Sans, MD [facility], [place]   In 9 months Army Melia Jesse Sans, MD [facility], [facility]

## 2020-09-25 DIAGNOSIS — Z96651 Presence of right artificial knee joint: Secondary | ICD-10-CM | POA: Diagnosis not present

## 2020-09-25 DIAGNOSIS — M1712 Unilateral primary osteoarthritis, left knee: Secondary | ICD-10-CM | POA: Diagnosis not present

## 2020-09-25 DIAGNOSIS — S8001XA Contusion of right knee, initial encounter: Secondary | ICD-10-CM | POA: Diagnosis not present

## 2020-09-25 DIAGNOSIS — S8002XA Contusion of left knee, initial encounter: Secondary | ICD-10-CM | POA: Diagnosis not present

## 2020-09-25 DIAGNOSIS — M25572 Pain in left ankle and joints of left foot: Secondary | ICD-10-CM | POA: Diagnosis not present

## 2020-10-02 DIAGNOSIS — S52592D Other fractures of lower end of left radius, subsequent encounter for closed fracture with routine healing: Secondary | ICD-10-CM | POA: Diagnosis not present

## 2020-10-02 DIAGNOSIS — S52602D Unspecified fracture of lower end of left ulna, subsequent encounter for closed fracture with routine healing: Secondary | ICD-10-CM | POA: Diagnosis not present

## 2020-10-17 ENCOUNTER — Encounter: Payer: Self-pay | Admitting: Occupational Therapy

## 2020-10-17 ENCOUNTER — Other Ambulatory Visit: Payer: Self-pay

## 2020-10-17 ENCOUNTER — Ambulatory Visit: Payer: Medicare Other | Attending: Surgery | Admitting: Occupational Therapy

## 2020-10-17 DIAGNOSIS — M6281 Muscle weakness (generalized): Secondary | ICD-10-CM | POA: Diagnosis not present

## 2020-10-17 DIAGNOSIS — M25632 Stiffness of left wrist, not elsewhere classified: Secondary | ICD-10-CM | POA: Diagnosis not present

## 2020-10-17 DIAGNOSIS — M25532 Pain in left wrist: Secondary | ICD-10-CM | POA: Insufficient documentation

## 2020-10-17 DIAGNOSIS — L905 Scar conditions and fibrosis of skin: Secondary | ICD-10-CM | POA: Diagnosis not present

## 2020-10-17 NOTE — Therapy (Signed)
[facility] [address] [place], [address] Phone: [phone]   Fax:  [phone]  Occupational Therapy Evaluation  Patient Details  Name: Ashley Glover MRN: [id number] Date of Birth: 1960/09/26 Referring Provider (OT): Dr Roland Rack   Encounter Date: 10/17/2020   OT End of Session - 10/17/20 1249    Visit Number 1    Number of Visits 8    Date for OT Re-Evaluation 11/21/20    OT Start Time 1121    OT Stop Time 1158    OT Time Calculation (min) 37 min    Activity Tolerance Patient tolerated treatment well    Behavior During Therapy [facility] for tasks assessed/performed           Past Medical History:  Diagnosis Date  . Allergic rhinitis   . Benign neoplasm of ascending colon   . Benign neoplasm of cecum   . Degenerative joint disease   . Fibromyalgia   . GERD (gastroesophageal reflux disease)   . Hx of abnormal cervical Pap smear 08/02/2015   1990's - had partial hysterectomy for dysplasia and HPV   . Hyperlipidemia   . Hypertension   . Hypothyroid   . Mitral valve prolapse    states it is mild and does not see a cardiologist  . Redness of both eyes 09/30/2017  . Sjogrens syndrome [facility])     Past Surgical History:  Procedure Laterality Date  . AUGMENTATION MAMMAPLASTY Bilateral 1987  . COLONOSCOPY  05/2012   polyps  . COLONOSCOPY WITH PROPOFOL N/A 06/20/2016   Procedure: COLONOSCOPY WITH PROPOFOL;  Surgeon: [name], MD;  Location: [place];  Service: Endoscopy;  Laterality: N/A;  . COLONOSCOPY WITH PROPOFOL N/A 11/11/2019   Procedure: COLONOSCOPY WITH PROPOFOL;  Surgeon: [name], MD;  Location: [place];  Service: Endoscopy;  Laterality: N/A;  3  . COLONOSCOPY WITH PROPOFOL N/A 03/06/2020   Procedure: COLONOSCOPY WITH BIOPSY;  Surgeon: [name], MD;  Location: [place];  Service: Endoscopy;  Laterality: N/A;  . ESOPHAGOGASTRODUODENOSCOPY (EGD) WITH  PROPOFOL N/A 06/20/2016   Procedure: ESOPHAGOGASTRODUODENOSCOPY (EGD) WITH PROPOFOL;  Surgeon: [name], MD;  Location: [place];  Service: Endoscopy;  Laterality: N/A;  . ESOPHAGOGASTRODUODENOSCOPY (EGD) WITH PROPOFOL N/A 11/11/2019   Procedure: ESOPHAGOGASTRODUODENOSCOPY (EGD) WITH PROPOFOL;  Surgeon: [name], MD;  Location: [place];  Service: Endoscopy;  Laterality: N/A;  . ESOPHAGOGASTRODUODENOSCOPY (EGD) WITH PROPOFOL N/A 03/06/2020   Procedure: ESOPHAGOGASTRODUODENOSCOPY (EGD) WITH BIOPSY;  Surgeon: [name], MD;  Location: [place];  Service: Endoscopy;  Laterality: N/A;  . FOOT SURGERY Right   . GANGLION CYST EXCISION Left   . NASAL SINUS SURGERY    . NEUROMA SURGERY Left   . OPEN REDUCTION INTERNAL FIXATION (ORIF) DISTAL RADIAL FRACTURE Left 08/24/2020   Procedure: OPEN REDUCTION INTERNAL FIXATION (ORIF) DISTAL RADIAL FRACTURE;  Surgeon: Corky Mull, MD;  Location: [facility];  Service: Orthopedics;  Laterality: Left;  . PARTIAL HYSTERECTOMY  1996   +HPV and cervical dysplasia  . PLACEMENT OF BREAST IMPLANTS    . POLYPECTOMY  06/20/2016   Procedure: POLYPECTOMY;  Surgeon: [name], MD;  Location: [place];  Service: Endoscopy;;  . POLYPECTOMY N/A 03/06/2020   Procedure: POLYPECTOMY;  Surgeon: [name], MD;  Location: [place];  Service: Endoscopy;  Laterality: N/A;  . REPLACEMENT TOTAL KNEE Right 06/2014  . TONSILLECTOMY      There were no vitals filed  for this visit.   Subjective Assessment - 10/17/20 1239    Subjective  I am doing okay -done some exercises but still stiff to rotate mostly my wrist and bending back and forth - still wearing my splint most all the time - only off with bathing and dressing    Pertinent History Ashley Glover is a 60 y.o. female that fell and had status post an open reduction and internal fixation of a left distal radial metaphyseal fracture with percutaneous pinning of the distal  radial ulnar joint by Dr Roland Rack on 08/24/20 after fall in her driveway  - and refer to OT for ROM and strengthening    Patient Stated Goals Want to be able to use my hand and wrist like before - to help my sister that had stroke, do house work , gardening    Currently in Pain? No/denies             [facility] OT Assessment - 10/17/20 0001      Assessment   Medical Diagnosis L distal radius and ulna fx with ORIF    Referring Provider (OT) Dr Roland Rack    Onset Date/Surgical Date 08/24/20    Hand Dominance Right    Next MD Visit in about 2 wks      Precautions   Required Braces or Orthoses --   wrist splint on - only off for ADL's     Home  Environment   Lives With Alone      Prior Function   Vocation Retired    Leisure Retired nsg, gardening, reading , helping her sister that had stroke in nsg home      AROM   Right Forearm Pronation 90 Degrees    Right Forearm Supination 90 Degrees    Left Forearm Pronation 90 Degrees    Left Forearm Supination 45 Degrees    Right Wrist Extension 75 Degrees    Right Wrist Flexion 90 Degrees    Right Wrist Radial Deviation 28 Degrees    Right Wrist Ulnar Deviation 38 Degrees    Left Wrist Extension 70 Degrees    Left Wrist Flexion 60 Degrees    Left Wrist Radial Deviation 20 Degrees    Left Wrist Ulnar Deviation 20 Degrees      Strength   Right Hand Grip (lbs) 48    Right Hand Lateral Pinch 9 lbs    Right Hand 3 Point Pinch 5 lbs    Left Hand Grip (lbs) 25    Left Hand Lateral Pinch 5 lbs    Left Hand 3 Point Pinch 6 lbs                    OT Treatments/Exercises (OP) - 10/17/20 0001      LUE Fluidotherapy   Number Minutes Fluidotherapy 10 Minutes    LUE Fluidotherapy Location Hand;Wrist    Comments AROM  for wrist in all planes prior to HEP review           review HEP - scar massage  And desensitization - massage , soft and rougher textures on wrist and forearm - hyper sensitivity per pt   AAROM for wrist flexion , ext  ,RD, UD 10 reps PROM for supination 10 reps hold 5 sec  And then 1 lbs for wrist in all planes - 12 reps   3 days if pain free - increase 2nd set -and if pain free 3 days   3rd set  2 x day -  pain free   and wean out of splint 2 hrs on and off at home - light activities       OT Education - 10/17/20 1249    Education Details findings of eval and HEP    Person(s) Educated Patient    Methods Explanation;Demonstration;Tactile cues;Verbal cues;Handout    Comprehension Verbal cues required;Returned demonstration;Verbalized understanding            OT Short Term Goals - 10/17/20 1254      OT SHORT TERM GOAL #1   Title Pt to be ind in HEP to increase forearm supination and flexion wtih more than 20 degrees to turn doorknob and push door open without increase symptoms    Baseline sup 45, flexion 60    Time 3    Period Weeks    Status New    Target Date 11/07/20             OT Long Term Goals - 10/17/20 1255      OT LONG TERM GOAL #1   Title L wrist AROM in all planes improve to [facility] to use hand in more than 85% of ADL's and IADL's without increase symptoms    Baseline wearing wrist splint most all the time except bathing and dressing - pick up and grip less than 1 lbs    Time 5    Period Weeks    Status New    Target Date 11/21/20      OT LONG TERM GOAL #2   Title L wrist strength in all planes increase to more than 4+/5 to carry more than 5 lbs and push door , wash clothes    Baseline 8 wks s/p - but most all the time in splint - no strengthening yet - ROM still impaired    Time 5    Period Weeks    Status New    Target Date 11/21/20      OT LONG TERM GOAL #3   Title L grip strenght increase to more than 75% compare to R hand to carry groceries, do laundry and squeeze washcloth without symptoms    Baseline Grip R 48,L 25 lbs, prehension R 9 lat , L 5 lbs, 3 point 5 lbs R , L 6 lbs but [facility] pain - splint most all the time still - no strenghtening yet    Time 5     Period Weeks    Status New    Target Date 11/21/20                 Plan - 10/17/20 1251    Clinical Impression Statement Pt is about 8 wks s/p an open reduction and internal fixation of a left distal radial metaphyseal fracture with percutaneous pinning of the distal radial ulnar joint. Pt present at OT eval with decrease supination , wrist flexion more than other planes- some scar tissue on ulnar side of wrist , some sensory changes at times in 4th and 5th digit- decrease grip and prehensiion strength and strength in wrist - limiting her functional use of L hand and wrist in ADL's and IADl's - pt still wearing most all the time her wrist splint - pt can benefit from OT services    OT Occupational Profile and History Problem Focused Assessment - Including review of records relating to presenting problem    Occupational performance deficits (Please refer to evaluation for details): ADL's;IADL's;Play;Leisure;Social Participation    Body Structure / Function / Physical Skills ADL;Flexibility;Decreased  knowledge of precautions;Scar mobility;ROM;UE functional use;Pain;Strength    Rehab Potential Good    Clinical Decision Making Limited treatment options, no task modification necessary    Comorbidities Affecting Occupational Performance: None    Modification or Assistance to Complete Evaluation  No modification of tasks or assist necessary to complete eval    OT Frequency 2x / week   1-2 x wk depending on progress   OT Duration --   5 wks   OT Treatment/Interventions Self-care/ADL training;Paraffin;Fluidtherapy;Therapeutic exercise;Manual Therapy;Patient/family education;Passive range of motion;Scar mobilization;Splinting           Patient will benefit from skilled therapeutic intervention in order to improve the following deficits and impairments:   Body Structure / Function / Physical Skills: ADL,Flexibility,Decreased knowledge of precautions,Scar mobility,ROM,UE functional  use,Pain,Strength       Visit Diagnosis: Stiffness of left wrist, not elsewhere classified - Plan: Ot plan of care cert/re-cert  Muscle weakness (generalized) - Plan: Ot plan of care cert/re-cert  Pain in left wrist - Plan: Ot plan of care cert/re-cert  Scar tissue - Plan: Ot plan of care cert/re-cert    Problem List Patient Active Problem List   Diagnosis Date Noted  . Distal radius fracture, left 08/24/2020  . S/P total hysterectomy 03/16/2020  . Acute gastritis without hemorrhage   . Dermatitis of both ear canals 03/01/2020  . Pruritus 01/25/2020  . Gastroesophageal reflux disease with esophagitis without hemorrhage   . Personal history of colonic polyps   . Polyp of ascending colon   . Insomnia 06/25/2019  . Osteopenia determined by x-ray 06/23/2018  . Chronic otitis media 09/30/2017  . Migraine without aura and without status migrainosus, not intractable 05/21/2017  . Not currently working due to disabled status 01/24/2017  . Degenerative arthritis 01/24/2017  . Frequent PVCs 08/20/2016  . Hx of colonic polyps 04/24/2016  . Periodic headache syndrome, not intractable 01/31/2016  . Rosacea, acne 01/31/2016  . Essential hypertension 08/02/2015  . Hypothyroidism due to acquired atrophy of thyroid 08/02/2015  . Fibromyalgia 08/02/2015  . Sjogren's syndrome ([place]) 08/02/2015  . Peptic ulcer disease 08/02/2015  . Primary osteoarthritis of both knees 08/02/2015  . Osteopenia 08/02/2015  . Other allergic rhinitis 08/02/2015  . Hyperlipidemia, mixed 08/02/2015    Rosalyn Gess OTR/L,CLT 10/17/2020, 1:02 PM  [facility] [address] [place], [address] Phone: [phone]   Fax:  [phone]  Name: Ashley Glover MRN: [id number] Date of Birth: 1960/06/24

## 2020-10-25 ENCOUNTER — Other Ambulatory Visit: Payer: Self-pay

## 2020-10-25 ENCOUNTER — Ambulatory Visit: Payer: Medicare Other | Attending: Surgery | Admitting: Occupational Therapy

## 2020-10-25 DIAGNOSIS — M25532 Pain in left wrist: Secondary | ICD-10-CM | POA: Diagnosis not present

## 2020-10-25 DIAGNOSIS — M25632 Stiffness of left wrist, not elsewhere classified: Secondary | ICD-10-CM | POA: Diagnosis not present

## 2020-10-25 DIAGNOSIS — M6281 Muscle weakness (generalized): Secondary | ICD-10-CM | POA: Diagnosis not present

## 2020-10-25 DIAGNOSIS — L905 Scar conditions and fibrosis of skin: Secondary | ICD-10-CM | POA: Insufficient documentation

## 2020-10-25 NOTE — Therapy (Signed)
[place] [facility] [address] [place], [address] Phone: [phone]   Fax:  [phone]  Occupational Therapy Treatment  Patient Details  Name: Ashley Glover MRN: [id number] Date of Birth: 27-Nov-1960 Referring Provider (OT): Dr Roland Rack   Encounter Date: 10/25/2020   OT End of Session - 10/25/20 1402    Visit Number 2    Number of Visits 8    Date for OT Re-Evaluation 11/21/20    OT Start Time 1400    OT Stop Time 1438    OT Time Calculation (min) 38 min    Activity Tolerance Patient tolerated treatment well    Behavior During Therapy [facility] for tasks assessed/performed           Past Medical History:  Diagnosis Date  . Allergic rhinitis   . Benign neoplasm of ascending colon   . Benign neoplasm of cecum   . Degenerative joint disease   . Fibromyalgia   . GERD (gastroesophageal reflux disease)   . Hx of abnormal cervical Pap smear 08/02/2015   1990's - had partial hysterectomy for dysplasia and HPV   . Hyperlipidemia   . Hypertension   . Hypothyroid   . Mitral valve prolapse    states it is mild and does not see a cardiologist  . Redness of both eyes 09/30/2017  . Sjogrens syndrome [facility])     Past Surgical History:  Procedure Laterality Date  . AUGMENTATION MAMMAPLASTY Bilateral 1987  . COLONOSCOPY  05/2012   polyps  . COLONOSCOPY WITH PROPOFOL N/A 06/20/2016   Procedure: COLONOSCOPY WITH PROPOFOL;  Surgeon: [name], MD;  Location: [place];  Service: Endoscopy;  Laterality: N/A;  . COLONOSCOPY WITH PROPOFOL N/A 11/11/2019   Procedure: COLONOSCOPY WITH PROPOFOL;  Surgeon: [name], MD;  Location: [place];  Service: Endoscopy;  Laterality: N/A;  3  . COLONOSCOPY WITH PROPOFOL N/A 03/06/2020   Procedure: COLONOSCOPY WITH BIOPSY;  Surgeon: [name], MD;  Location: [place];  Service: Endoscopy;  Laterality: N/A;  . ESOPHAGOGASTRODUODENOSCOPY (EGD) WITH PROPOFOL  N/A 06/20/2016   Procedure: ESOPHAGOGASTRODUODENOSCOPY (EGD) WITH PROPOFOL;  Surgeon: [name], MD;  Location: [place];  Service: Endoscopy;  Laterality: N/A;  . ESOPHAGOGASTRODUODENOSCOPY (EGD) WITH PROPOFOL N/A 11/11/2019   Procedure: ESOPHAGOGASTRODUODENOSCOPY (EGD) WITH PROPOFOL;  Surgeon: [name], MD;  Location: [place];  Service: Endoscopy;  Laterality: N/A;  . ESOPHAGOGASTRODUODENOSCOPY (EGD) WITH PROPOFOL N/A 03/06/2020   Procedure: ESOPHAGOGASTRODUODENOSCOPY (EGD) WITH BIOPSY;  Surgeon: [name], MD;  Location: [place];  Service: Endoscopy;  Laterality: N/A;  . FOOT SURGERY Right   . GANGLION CYST EXCISION Left   . NASAL SINUS SURGERY    . NEUROMA SURGERY Left   . OPEN REDUCTION INTERNAL FIXATION (ORIF) DISTAL RADIAL FRACTURE Left 08/24/2020   Procedure: OPEN REDUCTION INTERNAL FIXATION (ORIF) DISTAL RADIAL FRACTURE;  Surgeon: Corky Mull, MD;  Location: [facility];  Service: Orthopedics;  Laterality: Left;  . PARTIAL HYSTERECTOMY  1996   +HPV and cervical dysplasia  . PLACEMENT OF BREAST IMPLANTS    . POLYPECTOMY  06/20/2016   Procedure: POLYPECTOMY;  Surgeon: [name], MD;  Location: [place];  Service: Endoscopy;;  . POLYPECTOMY N/A 03/06/2020   Procedure: POLYPECTOMY;  Surgeon: [name], MD;  Location: [place];  Service: Endoscopy;  Laterality: N/A;  . REPLACEMENT TOTAL KNEE Right 06/2014  . TONSILLECTOMY      There were no vitals filed  for this visit.   Subjective Assessment - 10/25/20 1401    Subjective  I done what you told me- can tell it is better  my wrist and motion - it helped you telling me what to do    Pertinent History Ashley Glover is a 60 y.o. female that fell and had status post an open reduction and internal fixation of a left distal radial metaphyseal fracture with percutaneous pinning of the distal radial ulnar joint by Dr Roland Rack on 08/24/20 after fall in her driveway  - and refer to OT for  ROM and strengthening    Patient Stated Goals Want to be able to use my hand and wrist like before - to help my sister that had stroke, do house work , gardening    Currently in Pain? No/denies              [facility] OT Assessment - 10/25/20 0001      AROM   Left Forearm Supination 60 Degrees    Left Wrist Extension 75 Degrees    Left Wrist Flexion 70 Degrees      Strength   Left Hand Grip (lbs) 28    Left Hand Lateral Pinch 7 lbs           Pt made good progress in AROM for L wrist and forearm and grip strength increase - see flow sheet Pt able to carry 4 lbs without pull or discomfort  Can wean out of splint about 4 hrs in morning and afternoon- wear when doing something heavy           OT Treatments/Exercises (OP) - 10/25/20 0001      LUE Fluidotherapy   Number Minutes Fluidotherapy 8 Minutes    LUE Fluidotherapy Location Hand;Wrist    Comments AROM in all range prior to ROM , soft tissue           review scar massage and done MC spreads- and graston tool nr 2 on volar and dorsal forearm  And desensitization - massage , soft and rougher textures on wrist and forearm - hyper sensitivity per pt   AAROM for wrist flexion , ext ,RD, UD 10 reps to cont with PROM for supination 10 reps hold 5 sec done by OT - pt to cont with And then upgrade to 2 lbs for wrist in all planes - 12 reps   3 days if pain free - increase 2nd set -and if pain free another 3 days   3rd set  2 x day - pain free  Putty med teal - 12 reps pain free and same advancement as weight  and wean out of splint 4 hrs off in am and pm         OT Education - 10/25/20 1401    Education Details progress and HEP    Person(s) Educated Patient    Methods Explanation;Demonstration;Tactile cues;Verbal cues;Handout    Comprehension Verbal cues required;Returned demonstration;Verbalized understanding            OT Short Term Goals - 10/17/20 1254      OT SHORT TERM GOAL #1   Title Pt to be ind  in HEP to increase forearm supination and flexion wtih more than 20 degrees to turn doorknob and push door open without increase symptoms    Baseline sup 45, flexion 60    Time 3    Period Weeks    Status New    Target Date 11/07/20  OT Long Term Goals - 10/17/20 1255      OT LONG TERM GOAL #1   Title L wrist AROM in all planes improve to [facility] to use hand in more than 85% of ADL's and IADL's without increase symptoms    Baseline wearing wrist splint most all the time except bathing and dressing - pick up and grip less than 1 lbs    Time 5    Period Weeks    Status New    Target Date 11/21/20      OT LONG TERM GOAL #2   Title L wrist strength in all planes increase to more than 4+/5 to carry more than 5 lbs and push door , wash clothes    Baseline 8 wks s/p - but most all the time in splint - no strengthening yet - ROM still impaired    Time 5    Period Weeks    Status New    Target Date 11/21/20      OT LONG TERM GOAL #3   Title L grip strenght increase to more than 75% compare to R hand to carry groceries, do laundry and squeeze washcloth without symptoms    Baseline Grip R 48,L 25 lbs, prehension R 9 lat , L 5 lbs, 3 point 5 lbs R , L 6 lbs but CMC pain - splint most all the time still - no strenghtening yet    Time 5    Period Weeks    Status New    Target Date 11/21/20                 Plan - 10/25/20 1402    Clinical Impression Statement Pt is about 9 wks s/p a open reduction and internal fixation of a left distal radial metaphyseal fracture with percutaneous pinning of the distal radial ulnar joint. Pt present at OT eval last week with decrease supination , wrist flexion more than other planes- some scar tissue on ulnar side of wrist , some sensory changes at times in 4th and 5th digit- decrease grip and prehensiion strength and strength in wrist - limiting her functional use of L hand and wrist in ADL's and IADl's - bt this date pt show increase sup,  wrist flexion , ext and grip - upgradd her HEP to 2 lbs and putty add for gripping to HEP - but hold off on prehension because of arthritis history -  pt to cont to wean out of her splint during day  - pt can benefit from OT services    OT Occupational Profile and History Problem Focused Assessment - Including review of records relating to presenting problem    Occupational performance deficits (Please refer to evaluation for details): ADL's;IADL's;Play;Leisure;Social Participation    Body Structure / Function / Physical Skills ADL;Flexibility;Decreased knowledge of precautions;Scar mobility;ROM;UE functional use;Pain;Strength    Rehab Potential Good    Clinical Decision Making Limited treatment options, no task modification necessary    Comorbidities Affecting Occupational Performance: None    Modification or Assistance to Complete Evaluation  No modification of tasks or assist necessary to complete eval    OT Frequency 2x / week    OT Duration 4 weeks    OT Treatment/Interventions Self-care/ADL training;Paraffin;Fluidtherapy;Therapeutic exercise;Manual Therapy;Patient/family education;Passive range of motion;Scar mobilization;Splinting    Consulted and Agree with Plan of Care Patient           Patient will benefit from skilled therapeutic intervention in order to improve the following deficits and  impairments:   Body Structure / Function / Physical Skills: ADL,Flexibility,Decreased knowledge of precautions,Scar mobility,ROM,UE functional use,Pain,Strength       Visit Diagnosis: Muscle weakness (generalized)  Pain in left wrist  Scar tissue  Stiffness of left wrist, not elsewhere classified    Problem List Patient Active Problem List   Diagnosis Date Noted  . Distal radius fracture, left 08/24/2020  . S/P total hysterectomy 03/16/2020  . Acute gastritis without hemorrhage   . Dermatitis of both ear canals 03/01/2020  . Pruritus 01/25/2020  . Gastroesophageal reflux disease  with esophagitis without hemorrhage   . Personal history of colonic polyps   . Polyp of ascending colon   . Insomnia 06/25/2019  . Osteopenia determined by x-ray 06/23/2018  . Chronic otitis media 09/30/2017  . Migraine without aura and without status migrainosus, not intractable 05/21/2017  . Not currently working due to disabled status 01/24/2017  . Degenerative arthritis 01/24/2017  . Frequent PVCs 08/20/2016  . Hx of colonic polyps 04/24/2016  . Periodic headache syndrome, not intractable 01/31/2016  . Rosacea, acne 01/31/2016  . Essential hypertension 08/02/2015  . Hypothyroidism due to acquired atrophy of thyroid 08/02/2015  . Fibromyalgia 08/02/2015  . Sjogren's syndrome ([place]) 08/02/2015  . Peptic ulcer disease 08/02/2015  . Primary osteoarthritis of both knees 08/02/2015  . Osteopenia 08/02/2015  . Other allergic rhinitis 08/02/2015  . Hyperlipidemia, mixed 08/02/2015    Rosalyn Gess OTR/L,CLT 10/25/2020, 3:18 PM  [place] [facility] [address], [place], [address] Phone: [phone]   Fax:  [phone]  Name: Ashley Glover MRN: [id number] Date of Birth: 12-27-1960

## 2020-11-01 ENCOUNTER — Ambulatory Visit: Payer: Medicare Other | Admitting: Occupational Therapy

## 2020-11-07 ENCOUNTER — Other Ambulatory Visit: Payer: Self-pay

## 2020-11-07 ENCOUNTER — Ambulatory Visit: Payer: Medicare Other | Admitting: Occupational Therapy

## 2020-11-07 DIAGNOSIS — M25532 Pain in left wrist: Secondary | ICD-10-CM

## 2020-11-07 DIAGNOSIS — M25632 Stiffness of left wrist, not elsewhere classified: Secondary | ICD-10-CM

## 2020-11-07 DIAGNOSIS — M6281 Muscle weakness (generalized): Secondary | ICD-10-CM

## 2020-11-07 DIAGNOSIS — L905 Scar conditions and fibrosis of skin: Secondary | ICD-10-CM | POA: Diagnosis not present

## 2020-11-07 NOTE — Therapy (Signed)
[place] [facility] [address] [place], [place], [address] Phone: [phone]   Fax:  [phone]  Occupational Therapy Treatment  Patient Details  Name: Ashley Glover MRN: [id number] Date of Birth: 1961-04-07 Referring Provider (OT): Dr Roland Rack   Encounter Date: 11/07/2020   OT End of Session - 11/07/20 1204     Visit Number 3    Number of Visits 8    Date for OT Re-Evaluation 11/21/20    OT Start Time 1032    OT Stop Time 1110    OT Time Calculation (min) 38 min    Activity Tolerance Patient tolerated treatment well             Past Medical History:  Diagnosis Date   Allergic rhinitis    Benign neoplasm of ascending colon    Benign neoplasm of cecum    Degenerative joint disease    Fibromyalgia    GERD (gastroesophageal reflux disease)    Hx of abnormal cervical Pap smear 08/02/2015   1990's - had partial hysterectomy for dysplasia and HPV    Hyperlipidemia    Hypertension    Hypothyroid    Mitral valve prolapse    states it is mild and does not see a cardiologist   Redness of both eyes 09/30/2017   Sjogrens syndrome ([place])     Past Surgical History:  Procedure Laterality Date   AUGMENTATION MAMMAPLASTY Bilateral 1987   COLONOSCOPY  05/2012   polyps   COLONOSCOPY WITH PROPOFOL N/A 06/20/2016   Procedure: COLONOSCOPY WITH PROPOFOL;  Surgeon: [name], MD;  Location: [place];  Service: Endoscopy;  Laterality: N/A;   COLONOSCOPY WITH PROPOFOL N/A 11/11/2019   Procedure: COLONOSCOPY WITH PROPOFOL;  Surgeon: [name], MD;  Location: [place];  Service: Endoscopy;  Laterality: N/A;  3   COLONOSCOPY WITH PROPOFOL N/A 03/06/2020   Procedure: COLONOSCOPY WITH BIOPSY;  Surgeon: [name], MD;  Location: [place];  Service: Endoscopy;  Laterality: N/A;   ESOPHAGOGASTRODUODENOSCOPY (EGD) WITH PROPOFOL N/A 06/20/2016   Procedure: ESOPHAGOGASTRODUODENOSCOPY (EGD) WITH  PROPOFOL;  Surgeon: [name], MD;  Location: [place];  Service: Endoscopy;  Laterality: N/A;   ESOPHAGOGASTRODUODENOSCOPY (EGD) WITH PROPOFOL N/A 11/11/2019   Procedure: ESOPHAGOGASTRODUODENOSCOPY (EGD) WITH PROPOFOL;  Surgeon: [name], MD;  Location: [place];  Service: Endoscopy;  Laterality: N/A;   ESOPHAGOGASTRODUODENOSCOPY (EGD) WITH PROPOFOL N/A 03/06/2020   Procedure: ESOPHAGOGASTRODUODENOSCOPY (EGD) WITH BIOPSY;  Surgeon: [name], MD;  Location: [place];  Service: Endoscopy;  Laterality: N/A;   FOOT SURGERY Right    GANGLION CYST EXCISION Left    NASAL SINUS SURGERY     NEUROMA SURGERY Left    OPEN REDUCTION INTERNAL FIXATION (ORIF) DISTAL RADIAL FRACTURE Left 08/24/2020   Procedure: OPEN REDUCTION INTERNAL FIXATION (ORIF) DISTAL RADIAL FRACTURE;  Surgeon: Corky Mull, MD;  Location: [facility];  Service: Orthopedics;  Laterality: Left;   PARTIAL HYSTERECTOMY  1996   +HPV and cervical dysplasia   PLACEMENT OF BREAST IMPLANTS     POLYPECTOMY  06/20/2016   Procedure: POLYPECTOMY;  Surgeon: [name], MD;  Location: [place];  Service: Endoscopy;;   POLYPECTOMY N/A 03/06/2020   Procedure: POLYPECTOMY;  Surgeon: [name], MD;  Location: [place];  Service: Endoscopy;  Laterality: N/A;   REPLACEMENT TOTAL KNEE Right 06/2014   TONSILLECTOMY      There were no vitals filed for this visit.   Subjective Assessment -  11/07/20 1202     Subjective  Doing better- making progress - do have arthritis every where - so cont to have some pain over ulnar side of wrist turning palm over - doing 2 lbs without issues, putty medium - and wear splint about 50% of time -more of fear when I am out and about    Pertinent History Ashley Glover is a 60 y.o. female that fell and had status post an open reduction and internal fixation of a left distal radial metaphyseal fracture with percutaneous pinning of the distal radial ulnar joint by Dr  Roland Rack on 08/24/20 after fall in her driveway  - and refer to OT for ROM and strengthening    Patient Stated Goals Want to be able to use my hand and wrist like before - to help my sister that had stroke, do house work , gardening    Currently in Pain? Yes    Pain Score 1     Pain Location Wrist    Pain Orientation Left    Pain Descriptors / Indicators Aching    Pain Type Surgical pain    Pain Onset More than a month ago    Pain Frequency Intermittent    Aggravating Factors  supination end range                [facility] OT Assessment - 11/07/20 0001       AROM   Left Forearm Supination 80 Degrees    Left Wrist Extension 70 Degrees    Left Wrist Flexion 70 Degrees      Strength   Left Hand Grip (lbs) 28    Left Hand Lateral Pinch 8 lbs    Left Hand 3 Point Pinch 6 lbs                 Pt made great progress in AROM for L wrist - with some pain in end range supination - pt to focus on supination stretch Strength in wrist in all planes 5/5 except supination  Grip about the same - upgrade pt to green medium putty for gripping but decrease to 2 x  day - 15 reps and increase reps and sets over the next few days pain free  To 3 sets again    cont scar massage and done MC spreads- and graston tool nr 2 on volar and dorsal forearm  Pt to wean out of splint gradually - need to be less than 25% on    AAROM for wrist flexion , ext ,RD, UD 10 reps to cont with PROM for supination 10 reps hold 5 sec done by OT - pt to cont with Add table slides for wrist extention -and can try wall pushups  Pain free  10 reps   2 x day   Cont  2 lbs for wrist in all planes - 12 reps for another 2 wks - 3 sets - 2 x day               OT Education - 11/07/20 1204     Education Details progress and HEP    Person(s) Educated Patient    Methods Explanation;Demonstration;Tactile cues;Verbal cues;Handout    Comprehension Verbal cues required;Returned demonstration;Verbalized  understanding              OT Short Term Goals - 10/17/20 1254       OT SHORT TERM GOAL #1   Title Pt to be ind in HEP to increase forearm supination and  flexion wtih more than 20 degrees to turn doorknob and push door open without increase symptoms    Baseline sup 45, flexion 60    Time 3    Period Weeks    Status New    Target Date 11/07/20               OT Long Term Goals - 10/17/20 1255       OT LONG TERM GOAL #1   Title L wrist AROM in all planes improve to [facility] to use hand in more than 85% of ADL's and IADL's without increase symptoms    Baseline wearing wrist splint most all the time except bathing and dressing - pick up and grip less than 1 lbs    Time 5    Period Weeks    Status New    Target Date 11/21/20      OT LONG TERM GOAL #2   Title L wrist strength in all planes increase to more than 4+/5 to carry more than 5 lbs and push door , wash clothes    Baseline 8 wks s/p - but most all the time in splint - no strengthening yet - ROM still impaired    Time 5    Period Weeks    Status New    Target Date 11/21/20      OT LONG TERM GOAL #3   Title L grip strenght increase to more than 75% compare to R hand to carry groceries, do laundry and squeeze washcloth without symptoms    Baseline Grip R 48,L 25 lbs, prehension R 9 lat , L 5 lbs, 3 point 5 lbs R , L 6 lbs but CMC pain - splint most all the time still - no strenghtening yet    Time 5    Period Weeks    Status New    Target Date 11/21/20                   Plan - 11/07/20 1204     Clinical Impression Statement Pt is about 11 wks s/p a open reduction and internal fixation of a left distal radial metaphyseal fracture with percutaneous pinning of the distal radial ulnar joint. Pt present at OT eval  with decrease supination , wrist flexion more than other planes- some scar tissue on ulnar side of wrist , some sensory changes at times in 4th and 5th digit- decrease grip and prehensiion strength and  strength in wrist - limiting her functional use of L hand and wrist in ADL's and IADl's - but in 2 sessions she showed increase sup, wrist flexion , ext and grip - upgrade her HEP to do table slides , wall pushups - focus on end range supination stretch and cont  2 lbs and  upgrade to medium putty for gripping to HEP - but hold off on prehension because of arthritis history -  pt to cont to wean out of her splint during day to less than 25% - follow up in 2 wks for possible discharge at that time    OT Occupational Profile and History Problem Focused Assessment - Including review of records relating to presenting problem    Occupational performance deficits (Please refer to evaluation for details): ADL's;IADL's;Play;Leisure;Social Participation    Body Structure / Function / Physical Skills ADL;Flexibility;Decreased knowledge of precautions;Scar mobility;ROM;UE functional use;Pain;Strength    Rehab Potential Good    Clinical Decision Making Limited treatment options, no task modification necessary  Comorbidities Affecting Occupational Performance: None    Modification or Assistance to Complete Evaluation  No modification of tasks or assist necessary to complete eval    OT Frequency 2x / week    OT Duration 4 weeks    OT Treatment/Interventions Self-care/ADL training;Paraffin;Fluidtherapy;Therapeutic exercise;Manual Therapy;Patient/family education;Passive range of motion;Scar mobilization;Splinting    Consulted and Agree with Plan of Care Patient             Patient will benefit from skilled therapeutic intervention in order to improve the following deficits and impairments:   Body Structure / Function / Physical Skills: ADL, Flexibility, Decreased knowledge of precautions, Scar mobility, ROM, UE functional use, Pain, Strength       Visit Diagnosis: Muscle weakness (generalized)  Pain in left wrist  Scar tissue  Stiffness of left wrist, not elsewhere classified    Problem  List Patient Active Problem List   Diagnosis Date Noted   Distal radius fracture, left 08/24/2020   S/P total hysterectomy 03/16/2020   Acute gastritis without hemorrhage    Dermatitis of both ear canals 03/01/2020   Pruritus 01/25/2020   Gastroesophageal reflux disease with esophagitis without hemorrhage    Personal history of colonic polyps    Polyp of ascending colon    Insomnia 06/25/2019   Osteopenia determined by x-ray 06/23/2018   Chronic otitis media 09/30/2017   Migraine without aura and without status migrainosus, not intractable 05/21/2017   Not currently working due to disabled status 01/24/2017   Degenerative arthritis 01/24/2017   Frequent PVCs 08/20/2016   Hx of colonic polyps 04/24/2016   Periodic headache syndrome, not intractable 01/31/2016   Rosacea, acne 01/31/2016   Essential hypertension 08/02/2015   Hypothyroidism due to acquired atrophy of thyroid 08/02/2015   Fibromyalgia 08/02/2015   Sjogren's syndrome ([place]) 08/02/2015   Peptic ulcer disease 08/02/2015   Primary osteoarthritis of both knees 08/02/2015   Osteopenia 08/02/2015   Other allergic rhinitis 08/02/2015   Hyperlipidemia, mixed 08/02/2015    Ashley Glover OTR/L,CLT 11/07/2020, 12:07 PM  [place] [facility] [address] [place], [address] Phone: [phone]   Fax:  [phone]  Name: ONEDIA VARGUS MRN: [id number] Date of Birth: 10-Aug-1960

## 2020-11-17 DIAGNOSIS — S52592D Other fractures of lower end of left radius, subsequent encounter for closed fracture with routine healing: Secondary | ICD-10-CM | POA: Diagnosis not present

## 2020-11-21 ENCOUNTER — Ambulatory Visit: Payer: Medicare Other | Admitting: Occupational Therapy

## 2020-12-13 ENCOUNTER — Encounter: Payer: Self-pay | Admitting: Internal Medicine

## 2020-12-13 ENCOUNTER — Ambulatory Visit (INDEPENDENT_AMBULATORY_CARE_PROVIDER_SITE_OTHER): Payer: Medicare Other | Admitting: Internal Medicine

## 2020-12-13 ENCOUNTER — Other Ambulatory Visit: Payer: Self-pay

## 2020-12-13 VITALS — BP 122/86 | HR 68 | Ht 64.0 in | Wt 202.2 lb

## 2020-12-13 DIAGNOSIS — I10 Essential (primary) hypertension: Secondary | ICD-10-CM | POA: Diagnosis not present

## 2020-12-13 DIAGNOSIS — G43009 Migraine without aura, not intractable, without status migrainosus: Secondary | ICD-10-CM

## 2020-12-13 DIAGNOSIS — M797 Fibromyalgia: Secondary | ICD-10-CM

## 2020-12-13 MED ORDER — CELECOXIB 200 MG PO CAPS
200.0000 mg | ORAL_CAPSULE | Freq: Two times a day (BID) | ORAL | 0 refills | Status: DC
Start: 2020-12-13 — End: 2021-02-13

## 2020-12-13 MED ORDER — HYDROCHLOROTHIAZIDE 25 MG PO TABS: 12.5000 mg | ORAL_TABLET | Freq: Every day | ORAL | 3 refills | Status: DC

## 2020-12-13 NOTE — Progress Notes (Signed)
Date:  12/13/2020   Name:  Ashley Glover   DOB:  [birth year]   MRN:  [id number]   Chief Complaint: Hypertension  Hypertension This is a chronic problem. The problem is controlled. Pertinent negatives include no chest pain, headaches, palpitations or shortness of breath. Past treatments include beta blockers and diuretics. The current treatment provides significant improvement. There are no compliance problems.  There is no history of kidney disease, CAD/MI or CVA.  Migraine  This is a recurrent problem. The problem has been unchanged. The pain quality is similar to prior headaches. Associated symptoms include back pain. Pertinent negatives include no abdominal pain, coughing, dizziness or weakness. Treatments tried: fioricet PRN. The treatment provided moderate relief. Her past medical history is significant for hypertension.   Fibromyalgia - having more general pain and disruption in ADLs.  She is taking Cymbalta and gabapentin.  Advil has worked well but it causes stomach upset.  She tried tramadol in the past but it was not very effective.  Lab Results  Component Value Date   CREATININE 0.97 08/24/2020   BUN 15 08/24/2020   NA 139 08/24/2020   K 3.8 08/24/2020   CL 100 08/24/2020   CO2 26 08/24/2020   Lab Results  Component Value Date   CHOL 202 (H) 07/10/2020   HDL 71 07/10/2020   LDLCALC 102 (H) 07/10/2020   TRIG 173 (H) 07/10/2020   CHOLHDL 2.8 07/10/2020   Lab Results  Component Value Date   TSH 2.800 07/10/2020   Lab Results  Component Value Date   HGBA1C 5.9 (H) 07/10/2020   Lab Results  Component Value Date   WBC 13.4 (H) 08/24/2020   HGB 14.7 08/24/2020   HCT 46.1 (H) 08/24/2020   MCV 89.3 08/24/2020   PLT 270 08/24/2020   Lab Results  Component Value Date   ALT 25 07/10/2020   AST 27 07/10/2020   ALKPHOS 93 07/10/2020   BILITOT 0.4 07/10/2020     Review of Systems  Constitutional:  Negative for fatigue and unexpected weight change.  HENT:   Negative for nosebleeds.   Eyes:  Negative for visual disturbance.  Respiratory:  Negative for cough, chest tightness, shortness of breath and wheezing.   Cardiovascular:  Negative for chest pain, palpitations and leg swelling.  Gastrointestinal:  Negative for abdominal pain, constipation and diarrhea.  Musculoskeletal:  Positive for back pain, gait problem and myalgias.  Neurological:  Negative for dizziness, weakness, light-headedness and headaches.  Psychiatric/Behavioral:  Negative for dysphoric mood and sleep disturbance. The patient is not nervous/anxious.    Patient Active Problem List   Diagnosis Date Noted   Distal radius fracture, left 08/24/2020   S/P total hysterectomy 03/16/2020   Acute gastritis without hemorrhage    Dermatitis of both ear canals 03/01/2020   Pruritus 01/25/2020   Gastroesophageal reflux disease with esophagitis without hemorrhage    Personal history of colonic polyps    Polyp of ascending colon    Insomnia 06/25/2019   Osteopenia determined by x-Ashley 06/23/2018   Chronic otitis media 09/30/2017   Migraine without aura and without status migrainosus, not intractable 05/21/2017   Not currently working due to disabled status 01/24/2017   Degenerative arthritis 01/24/2017   Frequent PVCs 08/20/2016   Hx of colonic polyps 04/24/2016   Periodic headache syndrome, not intractable 01/31/2016   Rosacea, acne 01/31/2016   Essential hypertension 08/02/2015   Hypothyroidism due to acquired atrophy of thyroid 08/02/2015   Fibromyalgia 08/02/2015   Sjogren's  syndrome ([place]) 08/02/2015   Peptic ulcer disease 08/02/2015   Primary osteoarthritis of both knees 08/02/2015   Osteopenia 08/02/2015   Other allergic rhinitis 08/02/2015   Hyperlipidemia, mixed 08/02/2015    Allergies  Allergen Reactions   Ace Inhibitors Cough   Morphine And Related Nausea Only   Savella [Milnacipran Hcl] Palpitations    Past Surgical History:  Procedure Laterality Date    AUGMENTATION MAMMAPLASTY Bilateral 1987   COLONOSCOPY  05/2012   polyps   COLONOSCOPY WITH PROPOFOL N/A 06/20/2016   Procedure: COLONOSCOPY WITH PROPOFOL;  Surgeon: [name], MD;  Location: [place];  Service: Endoscopy;  Laterality: N/A;   COLONOSCOPY WITH PROPOFOL N/A 11/11/2019   Procedure: COLONOSCOPY WITH PROPOFOL;  Surgeon: [name], MD;  Location: [place];  Service: Endoscopy;  Laterality: N/A;  3   COLONOSCOPY WITH PROPOFOL N/A 03/06/2020   Procedure: COLONOSCOPY WITH BIOPSY;  Surgeon: [name], MD;  Location: [place];  Service: Endoscopy;  Laterality: N/A;   ESOPHAGOGASTRODUODENOSCOPY (EGD) WITH PROPOFOL N/A 06/20/2016   Procedure: ESOPHAGOGASTRODUODENOSCOPY (EGD) WITH PROPOFOL;  Surgeon: [name], MD;  Location: [place];  Service: Endoscopy;  Laterality: N/A;   ESOPHAGOGASTRODUODENOSCOPY (EGD) WITH PROPOFOL N/A 11/11/2019   Procedure: ESOPHAGOGASTRODUODENOSCOPY (EGD) WITH PROPOFOL;  Surgeon: [name], MD;  Location: [place];  Service: Endoscopy;  Laterality: N/A;   ESOPHAGOGASTRODUODENOSCOPY (EGD) WITH PROPOFOL N/A 03/06/2020   Procedure: ESOPHAGOGASTRODUODENOSCOPY (EGD) WITH BIOPSY;  Surgeon: [name], MD;  Location: [place];  Service: Endoscopy;  Laterality: N/A;   FOOT SURGERY Right    GANGLION CYST EXCISION Left    NASAL SINUS SURGERY     NEUROMA SURGERY Left    OPEN REDUCTION INTERNAL FIXATION (ORIF) DISTAL RADIAL FRACTURE Left 08/24/2020   Procedure: OPEN REDUCTION INTERNAL FIXATION (ORIF) DISTAL RADIAL FRACTURE;  Surgeon: Corky Mull, MD;  Location: [facility];  Service: Orthopedics;  Laterality: Left;   PARTIAL HYSTERECTOMY  1996   +HPV and cervical dysplasia   PLACEMENT OF BREAST IMPLANTS     POLYPECTOMY  06/20/2016   Procedure: POLYPECTOMY;  Surgeon: [name], MD;  Location: [place];  Service: Endoscopy;;   POLYPECTOMY N/A 03/06/2020   Procedure: POLYPECTOMY;  Surgeon:  [name], MD;  Location: [place];  Service: Endoscopy;  Laterality: N/A;   REPLACEMENT TOTAL KNEE Right 06/2014   TONSILLECTOMY      Social History   Tobacco Use   Smoking status: Never   Smokeless tobacco: Never  Vaping Use   Vaping Use: Never used  Substance Use Topics   Alcohol use: No    Alcohol/week: 0.0 standard drinks   Drug use: No     Medication list has been reviewed and updated.  Current Meds  Medication Sig   albuterol (VENTOLIN HFA) 108 (90 Base) MCG/ACT inhaler Inhale 2 puffs into the lungs every 6 (six) hours as needed for wheezing or shortness of breath.   aspirin 81 MG EC tablet Take 81 mg by mouth daily. Swallow whole.   butalbital-acetaminophen-caffeine (FIORICET WITH CODEINE) 50-325-40-30 MG capsule TAKE 1 CAPSULE BY MOUTH EVERY 6 (SIX) HOURS AS NEEDED FOR HEADACHE.   calcium-vitamin D (OSCAL WITH D) 250-125 MG-UNIT tablet Take 1 tablet by mouth daily.   DULoxetine (CYMBALTA) 60 MG capsule TAKE 1 CAPSULE BY MOUTH  DAILY   furosemide (LASIX) 40 MG tablet Take 1 tablet (40 mg total) by mouth daily.   gabapentin (NEURONTIN) 600 MG tablet TAKE 1 TABLET BY MOUTH 3  TIMES DAILY   hydrochlorothiazide (HYDRODIURIL) 25 MG tablet Take 12.5 mg by mouth daily.   HYDROcodone-acetaminophen (NORCO) 5-325 MG tablet Take 1 tablet by mouth every 6 (six) hours as needed for moderate pain.   levothyroxine (SYNTHROID) 88 MCG tablet TAKE 1 TABLET BY MOUTH  DAILY BEFORE BREAKFAST   loratadine (CLARITIN) 10 MG tablet Take 10 mg by mouth daily.   mometasone (ELOCON) 0.1 % lotion Apply topically daily. To both ears as needed   montelukast (SINGULAIR) 10 MG tablet TAKE 1 TABLET BY MOUTH AT  BEDTIME   Multiple Vitamin (MULTIVITAMIN ADULT PO) Take 1 tablet by mouth daily.   Omega-3 Fatty Acids (FISH OIL) 1000 MG CAPS Take by mouth.   pantoprazole (PROTONIX) 40 MG tablet TAKE 1 TABLET BY MOUTH  TWICE DAILY   promethazine (PHENERGAN) 25 MG tablet TAKE 1 TABLET (25 MG TOTAL)  BY MOUTH EVERY 8 (EIGHT) HOURS AS NEEDED FOR NAUSEA OR VOMITING.   propranolol (INDERAL) 40 MG tablet TAKE 1 TABLET BY MOUTH  TWICE DAILY   rosuvastatin (CRESTOR) 20 MG tablet Take 20 mg by mouth at bedtime.   tiZANidine (ZANAFLEX) 4 MG tablet TAKE 1 TABLET BY MOUTH 3  TIMES DAILY   tobramycin-dexamethasone (TOBRADEX) ophthalmic solution Place 1 drop into both eyes every 6 (six) hours.    PHQ 2/9 Scores 12/13/2020 07/10/2020 06/21/2019 12/22/2018  PHQ - 2 Score 0 0 0 0  PHQ- 9 Score 2 3 - 0    GAD 7 : Generalized Anxiety Score 12/13/2020 07/10/2020  Nervous, Anxious, on Edge 0 0  Control/stop worrying 0 0  Worry too much - different things 0 0  Trouble relaxing 0 0  Restless 0 0  Easily annoyed or irritable 0 0  Afraid - awful might happen 0 0  Total GAD 7 Score 0 0  Anxiety Difficulty Not difficult at all -    BP Readings from Last 3 Encounters:  12/13/20 122/86  08/25/20 (!) 123/93  07/10/20 128/82    Physical Exam Vitals and nursing note reviewed.  Constitutional:      General: She is not in acute distress.    Appearance: She is well-developed.  HENT:     Head: Normocephalic and atraumatic.  Cardiovascular:     Rate and Rhythm: Normal rate and regular rhythm.     Pulses: Normal pulses.     Heart sounds: No murmur heard. Pulmonary:     Effort: Pulmonary effort is normal. No respiratory distress.  Musculoskeletal:     Right lower leg: No edema.     Left lower leg: No edema.  Skin:    General: Skin is warm and dry.     Capillary Refill: Capillary refill takes less than 2 seconds.     Findings: No rash.  Neurological:     General: No focal deficit present.     Mental Status: She is alert and oriented to person, place, and time.  Psychiatric:        Mood and Affect: Mood normal.        Behavior: Behavior normal.    Wt Readings from Last 3 Encounters:  12/13/20 202 lb 3.2 oz (91.7 kg)  08/24/20 190 lb (86.2 kg)  07/10/20 198 lb (89.8 kg)    BP 122/86 (BP  Location: Right Arm, Patient Position: Sitting, Cuff Size: Large)   Pulse 68   Ht 5\' 4"  (1.626 m)   Wt 202 lb 3.2 oz (91.7 kg)   SpO2 96%   BMI 34.71 kg/m  Assessment and Plan: 1. Essential hypertension Clinically stable exam with well controlled BP. Tolerating medications without side effects at this time. Pt to continue current regimen and low sodium diet; benefits of regular exercise as able discussed. - hydrochlorothiazide (HYDRODIURIL) 25 MG tablet; Take 0.5 tablets (12.5 mg total) by mouth daily.  Dispense: 90 tablet; Refill: 3  2. Fibromyalgia Continue Cymbalta and gabapentin Will try Celebrex since she has good response to nsaids. Also consider Arthrotec as another option - celecoxib (CELEBREX) 200 MG capsule; Take 1 capsule (200 mg total) by mouth 2 (two) times daily.  Dispense: 60 capsule; Refill: 0  3. Migraine without aura and without status migrainosus, not intractable Clinically stable without change in character or frequency of migraine headaches Headaches respond well to current therapy with fioricet. Will continue current plan, follow up if worsening.   Partially dictated using Editor, commissioning. Any errors are unintentional.  Halina Maidens, MD [facility]  12/13/2020

## 2020-12-18 ENCOUNTER — Ambulatory Visit: Payer: Medicare Other | Admitting: Internal Medicine

## 2020-12-27 ENCOUNTER — Other Ambulatory Visit: Payer: Self-pay | Admitting: Internal Medicine

## 2020-12-27 DIAGNOSIS — M797 Fibromyalgia: Secondary | ICD-10-CM

## 2020-12-27 NOTE — Telephone Encounter (Signed)
Requested medication (s) are due for refill today:yes  Requested medication (s) are on the active medication list: yes  Last refill: 09/22/20 #270  0 refills  Future visit scheduled  yes 07/12/21  Notes to clinic:  not delegated  Requested Prescriptions  Pending Prescriptions Disp Refills   tiZANidine (ZANAFLEX) 4 MG tablet [Pharmacy Med Name: tiZANidine HCl 4 MG Oral Tablet] 270 tablet 0    Sig: TAKE 1 TABLET BY MOUTH 3  TIMES DAILY      Not Delegated - Cardiovascular:  Alpha-2 Agonists - tizanidine Failed - 12/27/2020  4:04 PM      Failed - This refill cannot be delegated      Passed - Valid encounter within last 6 months    Recent Outpatient Visits           2 weeks ago Essential hypertension   [facility] Glean Hess, MD   5 months ago Annual physical exam   [facility] Glean Hess, MD   10 months ago Cellulitis of right upper extremity   [facility] Glean Hess, MD   11 months ago Hyperlipidemia, mixed   [facility] Glean Hess, MD   1 year ago Annual physical exam   [facility] Glean Hess, MD       Future Appointments             In 6 months Army Melia Jesse Sans, MD [facility], [facility]

## 2021-01-01 ENCOUNTER — Other Ambulatory Visit: Payer: Self-pay | Admitting: Gastroenterology

## 2021-01-01 ENCOUNTER — Other Ambulatory Visit: Payer: Self-pay | Admitting: Internal Medicine

## 2021-01-01 DIAGNOSIS — M797 Fibromyalgia: Secondary | ICD-10-CM

## 2021-01-01 DIAGNOSIS — J3089 Other allergic rhinitis: Secondary | ICD-10-CM

## 2021-01-08 ENCOUNTER — Other Ambulatory Visit: Payer: Self-pay | Admitting: Internal Medicine

## 2021-01-08 DIAGNOSIS — I10 Essential (primary) hypertension: Secondary | ICD-10-CM

## 2021-01-08 NOTE — Telephone Encounter (Signed)
Please review. Pt hasnt had refill since 2018.  KP

## 2021-01-08 NOTE — Telephone Encounter (Signed)
  Notes to clinic:  Requested script has expired  Review for continued use and refill   Requested Prescriptions  Pending Prescriptions Disp Refills   furosemide (LASIX) 40 MG tablet [Pharmacy Med Name: Furosemide 40 MG Oral Tablet] 90 tablet 3    Sig: TAKE 1 TABLET BY MOUTH  DAILY     Cardiovascular:  Diuretics - Loop Passed - 01/08/2021  2:28 PM      Passed - K in normal range and within 360 days    Potassium  Date Value Ref Range Status  08/24/2020 3.8 3.5 - 5.1 mmol/L Final          Passed - Ca in normal range and within 360 days    Calcium  Date Value Ref Range Status  08/24/2020 9.8 8.9 - 10.3 mg/dL Final          Passed - Na in normal range and within 360 days    Sodium  Date Value Ref Range Status  08/24/2020 139 135 - 145 mmol/L Final  07/10/2020 139 134 - 144 mmol/L Final          Passed - Cr in normal range and within 360 days    Creatinine, Ser  Date Value Ref Range Status  08/24/2020 0.97 0.44 - 1.00 mg/dL Final          Passed - Last BP in normal range    BP Readings from Last 1 Encounters:  12/13/20 122/86          Passed - Valid encounter within last 6 months    Recent Outpatient Visits           3 weeks ago Essential hypertension   [facility] Glean Hess, MD   6 months ago Annual physical exam   [facility] Glean Hess, MD   10 months ago Cellulitis of right upper extremity   [facility] Glean Hess, MD   11 months ago Hyperlipidemia, mixed   [facility] Glean Hess, MD   1 year ago Annual physical exam   [facility] Glean Hess, MD       Future Appointments             In 6 months Army Melia Jesse Sans, MD [facility], [facility]

## 2021-02-05 DIAGNOSIS — S8002XA Contusion of left knee, initial encounter: Secondary | ICD-10-CM | POA: Diagnosis not present

## 2021-02-05 DIAGNOSIS — M25571 Pain in right ankle and joints of right foot: Secondary | ICD-10-CM | POA: Diagnosis not present

## 2021-02-05 DIAGNOSIS — M1712 Unilateral primary osteoarthritis, left knee: Secondary | ICD-10-CM | POA: Diagnosis not present

## 2021-02-05 DIAGNOSIS — M25572 Pain in left ankle and joints of left foot: Secondary | ICD-10-CM | POA: Diagnosis not present

## 2021-02-05 DIAGNOSIS — Z96651 Presence of right artificial knee joint: Secondary | ICD-10-CM | POA: Diagnosis not present

## 2021-02-05 DIAGNOSIS — M7051 Other bursitis of knee, right knee: Secondary | ICD-10-CM | POA: Diagnosis not present

## 2021-02-05 DIAGNOSIS — S8001XA Contusion of right knee, initial encounter: Secondary | ICD-10-CM | POA: Diagnosis not present

## 2021-02-13 ENCOUNTER — Other Ambulatory Visit: Payer: Self-pay | Admitting: Internal Medicine

## 2021-02-13 DIAGNOSIS — M797 Fibromyalgia: Secondary | ICD-10-CM

## 2021-02-13 DIAGNOSIS — G43C0 Periodic headache syndromes in child or adult, not intractable: Secondary | ICD-10-CM

## 2021-02-13 NOTE — Telephone Encounter (Signed)
Requested medication (s) are due for refill today: Yes  Requested medication (s) are on the active medication list:Yes  Last refill:  05/24/20  Future visit scheduled:Yes  Notes to clinic:  Unable to refill per protocol, cannot delegate.      Requested Prescriptions  Pending Prescriptions Disp Refills   butalbital-acetaminophen-caffeine (FIORICET WITH CODEINE) 50-325-40-30 MG capsule [Pharmacy Med Name: BUTALB-ACETAMIN-CAF-COD 50-325] 60 capsule     Sig: TAKE 1 CAPSULE BY MOUTH EVERY 6 (SIX) HOURS AS NEEDED FOR HEADACHE.     Not Delegated - Analgesics:  Opioid Agonist Combinations Failed - 02/13/2021 10:34 AM      Failed - This refill cannot be delegated      Failed - Urine Drug Screen completed in last 360 days      Passed - Valid encounter within last 6 months    Recent Outpatient Visits           2 months ago Essential hypertension   [facility] Glean Hess, MD   7 months ago Annual physical exam   [facility] Glean Hess, MD   11 months ago Cellulitis of right upper extremity   [facility] Glean Hess, MD   1 year ago Hyperlipidemia, mixed   [facility] Glean Hess, MD   1 year ago Annual physical exam   [facility] Glean Hess, MD       Future Appointments             In 4 months Army Melia Jesse Sans, MD [facility], [facility]

## 2021-02-15 ENCOUNTER — Other Ambulatory Visit: Payer: Self-pay | Admitting: Internal Medicine

## 2021-02-15 DIAGNOSIS — I10 Essential (primary) hypertension: Secondary | ICD-10-CM

## 2021-02-18 ENCOUNTER — Telehealth: Payer: Self-pay

## 2021-02-18 NOTE — Telephone Encounter (Signed)
Called to schedule AWV left message to contact PCP for appointment

## 2021-03-01 ENCOUNTER — Other Ambulatory Visit: Payer: Self-pay | Admitting: Internal Medicine

## 2021-03-01 DIAGNOSIS — Z1231 Encounter for screening mammogram for malignant neoplasm of breast: Secondary | ICD-10-CM

## 2021-03-16 DIAGNOSIS — S93401A Sprain of unspecified ligament of right ankle, initial encounter: Secondary | ICD-10-CM | POA: Diagnosis not present

## 2021-03-20 ENCOUNTER — Ambulatory Visit
Admission: RE | Admit: 2021-03-20 | Discharge: 2021-03-20 | Disposition: A | Discharge status: Home / Self Care | Payer: Medicare Other | Source: Ambulatory Visit | Attending: Internal Medicine | Admitting: Internal Medicine

## 2021-03-20 ENCOUNTER — Other Ambulatory Visit: Payer: Self-pay

## 2021-03-20 DIAGNOSIS — Z1231 Encounter for screening mammogram for malignant neoplasm of breast: Secondary | ICD-10-CM | POA: Insufficient documentation

## 2021-04-30 DIAGNOSIS — M1712 Unilateral primary osteoarthritis, left knee: Secondary | ICD-10-CM | POA: Diagnosis not present

## 2021-04-30 DIAGNOSIS — M7051 Other bursitis of knee, right knee: Secondary | ICD-10-CM | POA: Diagnosis not present

## 2021-04-30 DIAGNOSIS — M25572 Pain in left ankle and joints of left foot: Secondary | ICD-10-CM | POA: Diagnosis not present

## 2021-04-30 DIAGNOSIS — M25571 Pain in right ankle and joints of right foot: Secondary | ICD-10-CM | POA: Diagnosis not present

## 2021-05-10 ENCOUNTER — Other Ambulatory Visit: Payer: Self-pay | Admitting: Internal Medicine

## 2021-05-10 DIAGNOSIS — I10 Essential (primary) hypertension: Secondary | ICD-10-CM

## 2021-05-11 NOTE — Telephone Encounter (Signed)
Requested medication (s) are due for refill today:   Yes  Requested medication (s) are on the active medication list:   Yes  Future visit scheduled:   Yes   Last ordered: 02/15/2021 #180, 0 refills  Returned because pharmacy requesting a 1 yr supply for mail order   Requested Prescriptions  Pending Prescriptions Disp Refills   propranolol (INDERAL) 40 MG tablet [Pharmacy Med Name: PROPRANOLOL  40MG   TAB] 180 tablet 3    Sig: TAKE 1 TABLET BY MOUTH  TWICE DAILY     Cardiovascular:  Beta Blockers Passed - 05/10/2021 10:53 PM      Passed - Last BP in normal range    BP Readings from Last 1 Encounters:  12/13/20 122/86          Passed - Last Heart Rate in normal range    Pulse Readings from Last 1 Encounters:  12/13/20 68          Passed - Valid encounter within last 6 months    Recent Outpatient Visits           4 months ago Essential hypertension   [facility] Glean Hess, MD   10 months ago Annual physical exam   [facility] Glean Hess, MD   1 year ago Cellulitis of right upper extremity   [facility] Glean Hess, MD   1 year ago Hyperlipidemia, mixed   [facility] Glean Hess, MD   1 year ago Annual physical exam   [facility] Glean Hess, MD       Future Appointments             In 2 months Army Melia Jesse Sans, MD [facility], [facility]

## 2021-05-29 ENCOUNTER — Other Ambulatory Visit: Payer: Self-pay | Admitting: Internal Medicine

## 2021-05-29 ENCOUNTER — Other Ambulatory Visit: Payer: Self-pay | Admitting: Gastroenterology

## 2021-05-29 DIAGNOSIS — E034 Atrophy of thyroid (acquired): Secondary | ICD-10-CM

## 2021-05-30 NOTE — Telephone Encounter (Signed)
Requested Prescriptions  Pending Prescriptions Disp Refills   levothyroxine (SYNTHROID) 88 MCG tablet [Pharmacy Med Name: Levothyroxine Sodium 88 MCG Oral Tablet] 90 tablet 3    Sig: TAKE 1 TABLET BY MOUTH  DAILY BEFORE BREAKFAST     Endocrinology:  Hypothyroid Agents Failed - 05/29/2021 10:44 PM      Failed - TSH needs to be rechecked within 3 months after an abnormal result. Refill until TSH is due.      Passed - TSH in normal range and within 360 days    TSH  Date Value Ref Range Status  07/10/2020 2.800 0.450 - 4.500 uIU/mL Final         Passed - Valid encounter within last 12 months    Recent Outpatient Visits          5 months ago Essential hypertension   [facility] Glean Hess, MD   10 months ago Annual physical exam   [facility] Glean Hess, MD   1 year ago Cellulitis of right upper extremity   [facility] Glean Hess, MD   1 year ago Hyperlipidemia, mixed   [facility] Glean Hess, MD   1 year ago Annual physical exam   [facility] Glean Hess, MD      Future Appointments            In 1 month Army Melia Jesse Sans, MD [facility], [facility]            DULoxetine (CYMBALTA) 60 MG capsule [Pharmacy Med Name: DULoxetine HCl 60 MG Oral Capsule Delayed Release Particles] 90 capsule 3    Sig: TAKE 1 CAPSULE BY MOUTH  DAILY     Psychiatry: Antidepressants - SNRI Passed - 05/29/2021 10:44 PM      Passed - Last BP in normal range    BP Readings from Last 1 Encounters:  12/13/20 122/86         Passed - Valid encounter within last 6 months    Recent Outpatient Visits          5 months ago Essential hypertension   [facility] Glean Hess, MD   10 months ago Annual physical exam   [facility] Glean Hess, MD   1 year ago Cellulitis of right upper extremity   [facility] Glean Hess, MD   1 year ago Hyperlipidemia, mixed   [facility] Glean Hess, MD   1 year ago Annual physical exam   [facility] Glean Hess, MD      Future Appointments            In 1 month Army Melia Jesse Sans, MD [facility], [facility]

## 2021-07-02 DIAGNOSIS — I10 Essential (primary) hypertension: Secondary | ICD-10-CM | POA: Diagnosis not present

## 2021-07-02 DIAGNOSIS — I493 Ventricular premature depolarization: Secondary | ICD-10-CM | POA: Diagnosis not present

## 2021-07-02 IMAGING — MG DIGITAL SCREENING BREAST BILAT IMPLANT W/ TOMO W/ CAD
8 of 16 series · 8 of 32 positions shown · non-contrast
Comparison: Previous exam(s).

CLINICAL DATA: Screening.

EXAM:
DIGITAL SCREENING BILATERAL MAMMOGRAM WITH IMPLANTS, CAD AND TOMO
The patient has retroglandular implants. Standard and implant
displaced views were performed.

[R CC (1 of 2)]
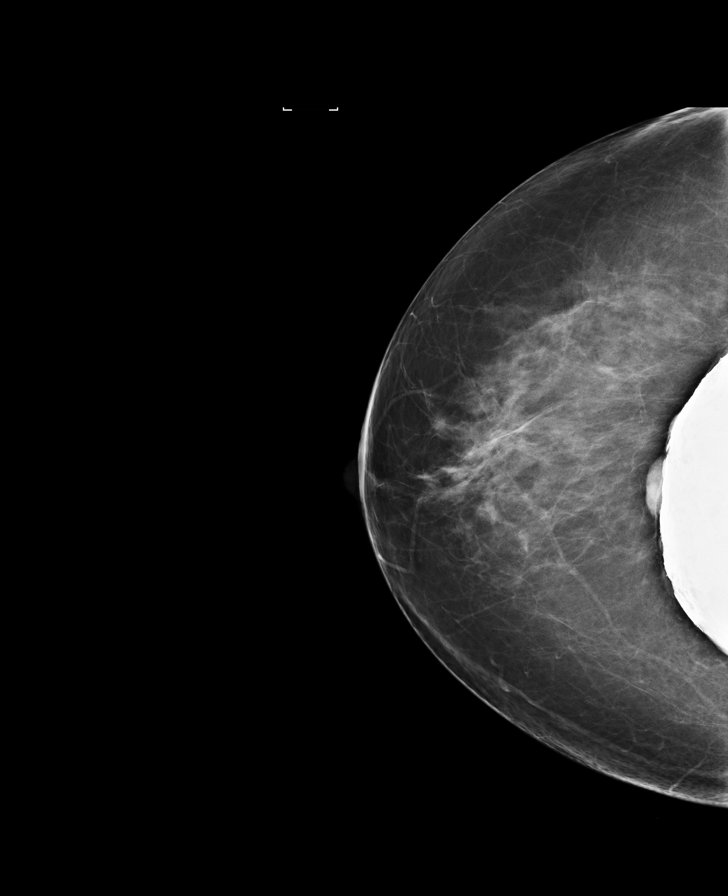

[L MLO]
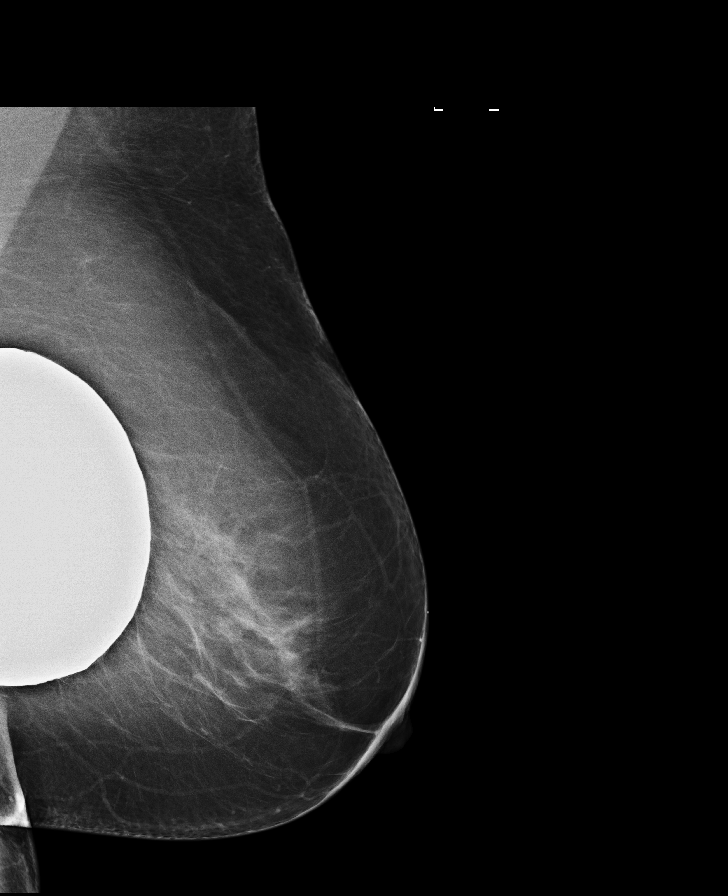

[R MLO]
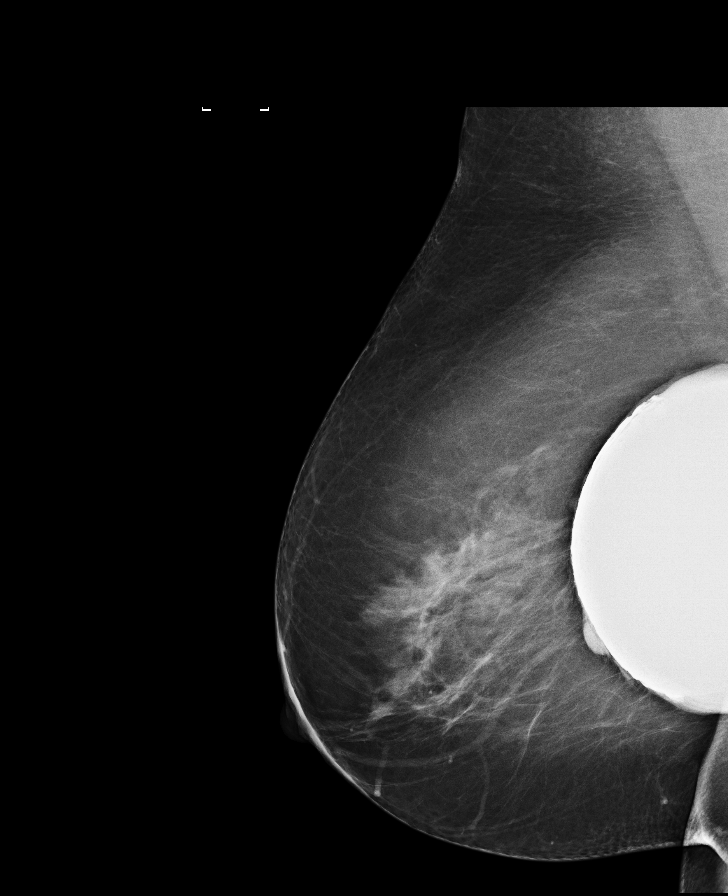

[L CC]
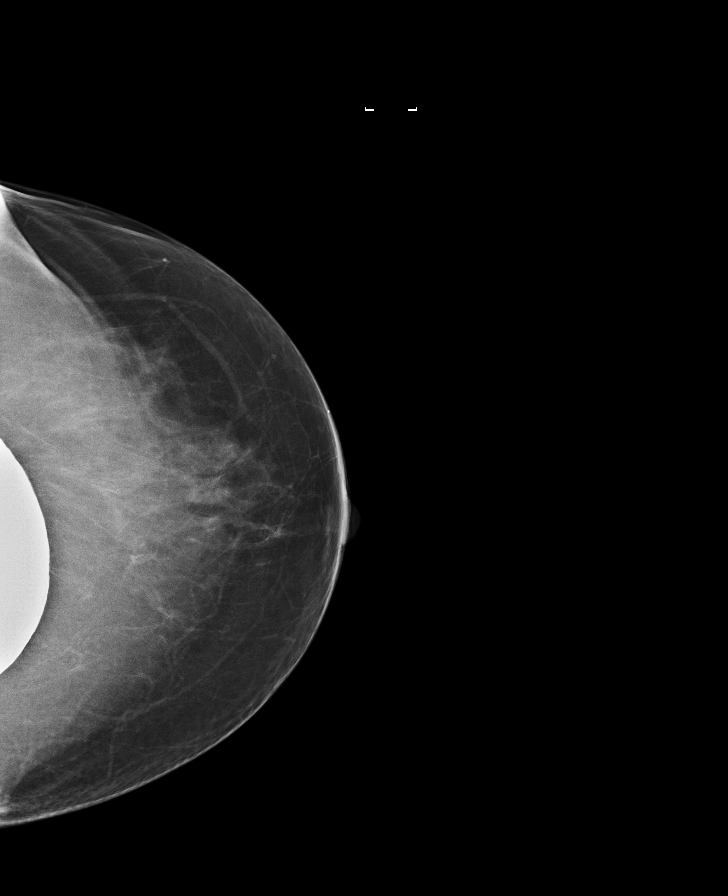

[R CC synth-2D]
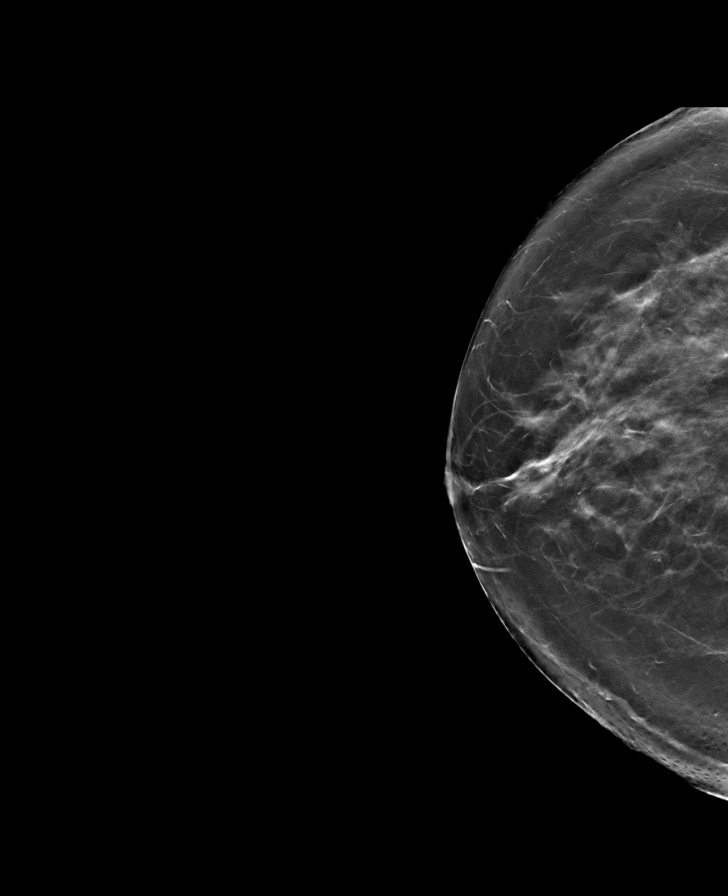

[R MLO synth-2D]
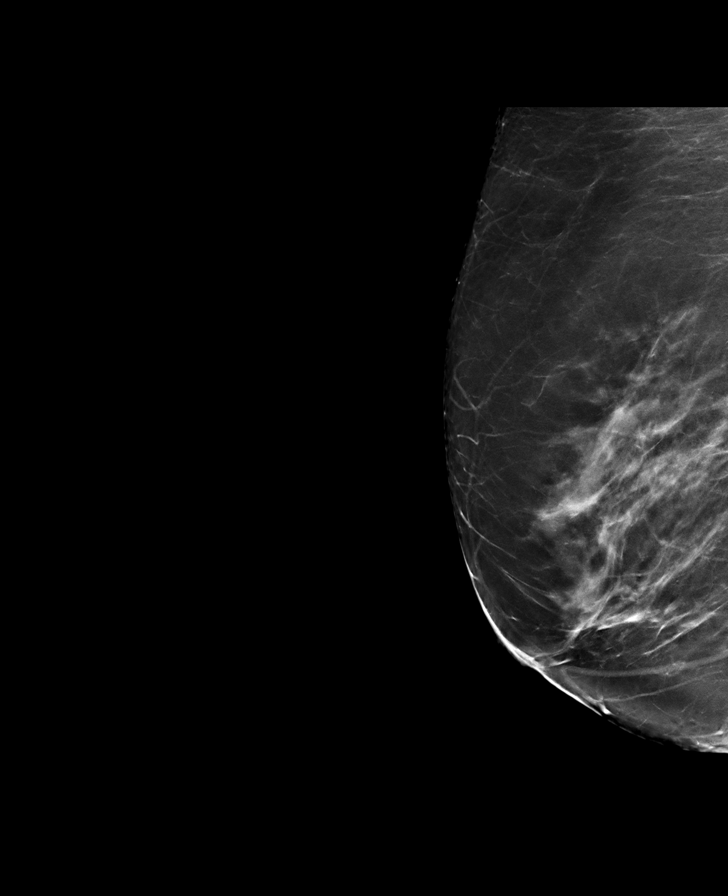

[L MLO synth-2D]
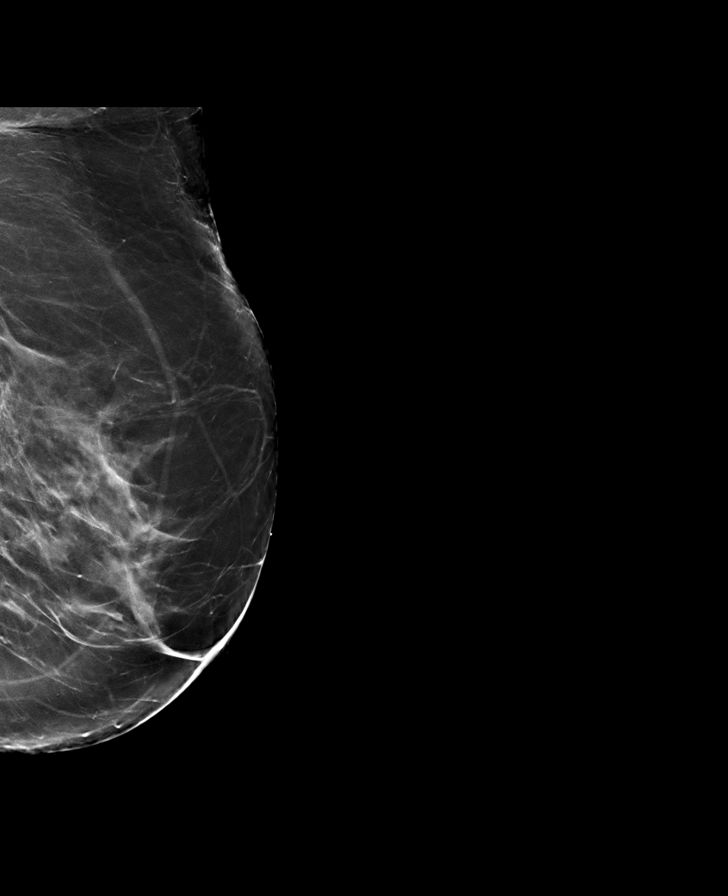

[R CC (2 of 2)]
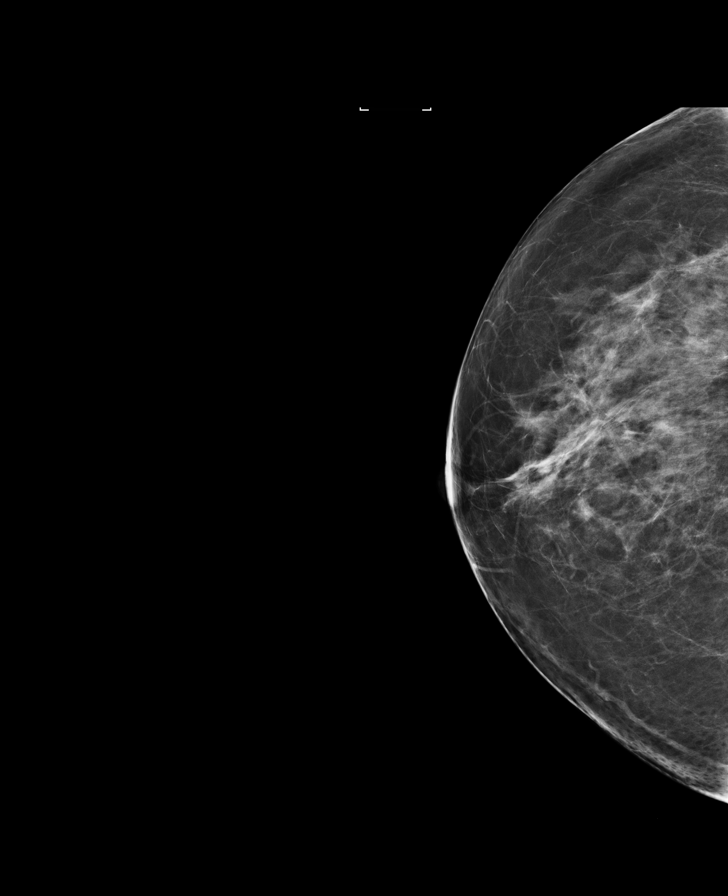

[8 of 32 positions shown; findings below may reference images not displayed]

ACR Breast Density Category c: The breast tissue is heterogeneously
dense, which may obscure small masses.
FINDINGS: There are no findings suspicious for malignancy. Images were
processed with CAD.
IMPRESSION: No mammographic evidence of malignancy. A result letter of this
screening mammogram will be mailed directly to the patient.

RECOMMENDATION:
Screening mammogram in one year. (Code:57-0-9UK)

BI-RADS CATEGORY  1:  Negative.

## 2021-07-12 ENCOUNTER — Encounter: Payer: Medicare Other | Admitting: Internal Medicine

## 2021-07-31 ENCOUNTER — Other Ambulatory Visit: Payer: Self-pay | Admitting: Internal Medicine

## 2021-07-31 DIAGNOSIS — I10 Essential (primary) hypertension: Secondary | ICD-10-CM

## 2021-08-01 NOTE — Telephone Encounter (Signed)
Courtesy refill. Future visit in 2 weeks . ?Requested Prescriptions  ?Pending Prescriptions Disp Refills  ?? propranolol (INDERAL) 40 MG tablet [Pharmacy Med Name: PROPRANOLOL  '40MG'$   TAB] 180 tablet 0  ?  Sig: TAKE 1 TABLET BY MOUTH TWICE  DAILY  ?  ? Cardiovascular:  Beta Blockers Failed - 07/31/2021 10:24 PM  ?  ?  Failed - Valid encounter within last 6 months  ?  Recent Outpatient Visits   ?      ? 7 months ago Essential hypertension  ? [facility] Glean Hess, MD  ? 1 year ago Annual physical exam  ? [facility] Glean Hess, MD  ? 1 year ago Cellulitis of right upper extremity  ? [facility] Glean Hess, MD  ? 1 year ago Hyperlipidemia, mixed  ? [facility] Glean Hess, MD  ? 2 years ago Annual physical exam  ? [facility] Glean Hess, MD  ?  ?  ?Future Appointments   ?        ? In 2 weeks Glean Hess, MD [facility], [facility]  ?  ? ?  ?  ?  Passed - Last BP in normal range  ?  BP Readings from Last 1 Encounters:  ?12/13/20 122/86  ?   ?  ?  Passed - Last Heart Rate in normal range  ?  Pulse Readings from Last 1 Encounters:  ?12/13/20 68  ?   ?  ?  ? ?

## 2021-08-10 ENCOUNTER — Other Ambulatory Visit: Payer: Self-pay

## 2021-08-10 DIAGNOSIS — I10 Essential (primary) hypertension: Secondary | ICD-10-CM

## 2021-08-10 MED ORDER — FUROSEMIDE 40 MG PO TABS: 40.0000 mg | ORAL_TABLET | Freq: Every day | ORAL | 0 refills | Status: DC

## 2021-08-20 ENCOUNTER — Encounter: Payer: Medicare Other | Admitting: Internal Medicine

## 2021-08-24 ENCOUNTER — Other Ambulatory Visit: Payer: Self-pay | Admitting: Internal Medicine

## 2021-08-24 DIAGNOSIS — E034 Atrophy of thyroid (acquired): Secondary | ICD-10-CM

## 2021-08-27 NOTE — Telephone Encounter (Signed)
? Requested medication (s) are due for refill today: yes (mail order) ? ?Requested medication (s) are on the active medication list: yes ? ?Last refill:  Synthroid 05/30/21 #90 with 0 RF, Cymbalta 05/30/21 #90 with 0 RF ? ?Future visit scheduled: 09/19/21 ? ?Notes to clinic:  Failed protocol of labs within 12 months, has upcoming appt, please assess. ? ? ? ?Requested Prescriptions  ?Pending Prescriptions Disp Refills  ? DULoxetine (CYMBALTA) 60 MG capsule [Pharmacy Med Name: DULoxetine HCl 60 MG Oral Capsule Delayed Release Particles] 90 capsule 3  ?  Sig: TAKE 1 CAPSULE BY MOUTH  DAILY  ?  ? Psychiatry: Antidepressants - SNRI - duloxetine Failed - 08/24/2021 10:41 PM  ?  ?  Failed - Cr in normal range and within 360 days  ?  Creatinine, Ser  ?Date Value Ref Range Status  ?08/24/2020 0.97 0.44 - 1.00 mg/dL Final  ?  ?  ?  ?  Failed - eGFR is 30 or above and within 360 days  ?  GFR calc Af Amer  ?Date Value Ref Range Status  ?07/10/2020 76 >59 mL/min/1.73 Final  ?  Comment:  ?  **In accordance with recommendations from the NKF-ASN Task force,** ?  [facility] is in the process of updating its eGFR calculation to the ?  2021 CKD-EPI creatinine equation that estimates kidney function ?  without a race variable. ?  ? ?GFR, Estimated  ?Date Value Ref Range Status  ?08/24/2020 >60 >60 mL/min Final  ?  Comment:  ?  (NOTE) ?Calculated using the CKD-EPI Creatinine Equation (2021) ?  ?  ?  ?  ?  Failed - Valid encounter within last 6 months  ?  Recent Outpatient Visits   ? ?      ? 8 months ago Essential hypertension  ? [facility] Glean Hess, MD  ? 1 year ago Annual physical exam  ? [facility] Glean Hess, MD  ? 1 year ago Cellulitis of right upper extremity  ? [facility] Glean Hess, [facility]  ? 1 year ago Hyperlipidemia, mixed  ? [facility] Glean Hess, MD  ? 2 years ago Annual physical exam  ? [facility] Glean Hess, MD  ? ?  ?  ?Future Appointments    ? ?        ? In 3 weeks Glean Hess, MD [facility], [place]  ? ?  ? ?  ?  ?  Passed - Completed PHQ-2 or PHQ-9 in the last 360 days  ?  ?  Passed - Last BP in normal range  ?  BP Readings from Last 1 Encounters:  ?12/13/20 122/86  ?  ?  ?  ?  ? levothyroxine (SYNTHROID) 88 MCG tablet [Pharmacy Med Name: Levothyroxine Sodium 88 MCG Oral Tablet] 90 tablet 3  ?  Sig: TAKE 1 TABLET BY MOUTH  DAILY BEFORE BREAKFAST  ?  ? Endocrinology:  Hypothyroid Agents Failed - 08/24/2021 10:41 PM  ?  ?  Failed - TSH in normal range and within 360 days  ?  TSH  ?Date Value Ref Range Status  ?07/10/2020 2.800 0.450 - 4.500 uIU/mL Final  ?  ?  ?  ?  Passed - Valid encounter within last 12 months  ?  Recent Outpatient Visits   ? ?      ? 8 months ago Essential hypertension  ? [facility] Glean Hess, MD  ?  1 year ago Annual physical exam  ? [facility] Glean Hess, MD  ? 1 year ago Cellulitis of right upper extremity  ? [facility] Glean Hess, MD  ? 1 year ago Hyperlipidemia, mixed  ? [facility] Glean Hess, MD  ? 2 years ago Annual physical exam  ? [facility] Glean Hess, MD  ? ?  ?  ?Future Appointments   ? ?        ? In 3 weeks Glean Hess, MD [facility], [place]  ? ?  ? ?  ?  ?  ? ? ?

## 2021-09-12 DIAGNOSIS — I493 Ventricular premature depolarization: Secondary | ICD-10-CM | POA: Diagnosis not present

## 2021-09-12 DIAGNOSIS — I10 Essential (primary) hypertension: Secondary | ICD-10-CM | POA: Diagnosis not present

## 2021-09-12 DIAGNOSIS — E782 Mixed hyperlipidemia: Secondary | ICD-10-CM | POA: Diagnosis not present

## 2021-09-12 DIAGNOSIS — I77810 Thoracic aortic ectasia: Secondary | ICD-10-CM | POA: Diagnosis not present

## 2021-09-17 ENCOUNTER — Encounter: Payer: Self-pay | Admitting: Internal Medicine

## 2021-09-18 ENCOUNTER — Other Ambulatory Visit: Payer: Self-pay | Admitting: Internal Medicine

## 2021-09-18 ENCOUNTER — Other Ambulatory Visit: Payer: Self-pay | Admitting: Gastroenterology

## 2021-09-18 DIAGNOSIS — I10 Essential (primary) hypertension: Secondary | ICD-10-CM

## 2021-09-18 DIAGNOSIS — M797 Fibromyalgia: Secondary | ICD-10-CM

## 2021-09-19 ENCOUNTER — Ambulatory Visit (INDEPENDENT_AMBULATORY_CARE_PROVIDER_SITE_OTHER): Payer: Medicare Other | Admitting: Internal Medicine

## 2021-09-19 ENCOUNTER — Encounter: Payer: Self-pay | Admitting: Internal Medicine

## 2021-09-19 VITALS — BP 128/86 | HR 65 | Ht 64.0 in | Wt 196.4 lb

## 2021-09-19 DIAGNOSIS — K2100 Gastro-esophageal reflux disease with esophagitis, without bleeding: Secondary | ICD-10-CM

## 2021-09-19 DIAGNOSIS — Z1231 Encounter for screening mammogram for malignant neoplasm of breast: Secondary | ICD-10-CM | POA: Diagnosis not present

## 2021-09-19 DIAGNOSIS — M3500 Sicca syndrome, unspecified: Secondary | ICD-10-CM

## 2021-09-19 DIAGNOSIS — M797 Fibromyalgia: Secondary | ICD-10-CM | POA: Diagnosis not present

## 2021-09-19 DIAGNOSIS — R7309 Other abnormal glucose: Secondary | ICD-10-CM | POA: Diagnosis not present

## 2021-09-19 DIAGNOSIS — G43009 Migraine without aura, not intractable, without status migrainosus: Secondary | ICD-10-CM

## 2021-09-19 DIAGNOSIS — E782 Mixed hyperlipidemia: Secondary | ICD-10-CM

## 2021-09-19 DIAGNOSIS — I10 Essential (primary) hypertension: Secondary | ICD-10-CM | POA: Diagnosis not present

## 2021-09-19 DIAGNOSIS — E034 Atrophy of thyroid (acquired): Secondary | ICD-10-CM

## 2021-09-19 DIAGNOSIS — Z0000 Encounter for general adult medical examination without abnormal findings: Secondary | ICD-10-CM

## 2021-09-19 LAB — POCT URINALYSIS DIPSTICK
Bilirubin, UA: NEGATIVE
Blood, UA: NEGATIVE
Glucose, UA: NEGATIVE
Ketones, UA: NEGATIVE
Leukocytes, UA: NEGATIVE
Nitrite, UA: NEGATIVE
Protein, UA: NEGATIVE
Spec Grav, UA: 1.01 (ref 1.010–1.025)
Urobilinogen, UA: 0.2 E.U./dL
pH, UA: 6 (ref 5.0–8.0)

## 2021-09-19 MED ORDER — PROMETHAZINE HCL 25 MG PO TABS: 25.0000 mg | ORAL_TABLET | Freq: Three times a day (TID) | ORAL | 1 refills | Status: DC | PRN

## 2021-09-19 MED ORDER — LEVOTHYROXINE SODIUM 88 MCG PO TABS: 88.0000 ug | ORAL_TABLET | Freq: Every day | ORAL | 1 refills | Status: DC

## 2021-09-19 MED ORDER — BUTALBITAL-APAP-CAFF-COD 50-325-40-30 MG PO CAPS: 1.0000 | ORAL_CAPSULE | Freq: Four times a day (QID) | ORAL | 1 refills | Status: DC | PRN

## 2021-09-19 NOTE — Patient Instructions (Signed)
Wegovy inj for weight loss ?

## 2021-09-19 NOTE — Progress Notes (Signed)
? ? ?Date:  09/19/2021  ? ?Name:  Ashley Glover   DOB:  08-Dec-1960   MRN:  [id number] ? ? ?Chief Complaint: Annual Exam ?Ashley Glover is a 61 y.o. female who presents today for her Complete Annual Exam. She feels poorly. She reports not exercising. She reports she is sleeping poorly. Breast complaints - none. ? ?Mammogram: 02/2021 ?DEXA: 07/2020 normal ?Pap smear: discontinued ?Colonoscopy: 02/2020 repeat 3 yrs ? ?Health Maintenance Due  ?Topic Date Due  ? HIV Screening  Never done  ? TETANUS/TDAP  11/09/2018  ?  ?Immunization History  ?Administered Date(s) Administered  ? Influenza,inj,Quad PF,6+ Mos 04/24/2016, 03/04/2017, 03/11/2018  ? Influenza-Unspecified 03/28/2019  ? Zoster Recombinat (Shingrix) 08/03/2019, 11/05/2019  ? ? ?Hypertension ?This is a chronic problem. The problem is controlled. Pertinent negatives include no chest pain, headaches, palpitations or shortness of breath. Past treatments include diuretics. Identifiable causes of hypertension include a thyroid problem.  ?Hyperlipidemia ?This is a chronic problem. The problem is controlled. Associated symptoms include myalgias. Pertinent negatives include no chest pain or shortness of breath. Current antihyperlipidemic treatment includes statins.  ?Thyroid Problem ?Presents for follow-up visit. Patient reports no anxiety, constipation, diarrhea, fatigue, palpitations or tremors. The symptoms have been stable. Her past medical history is significant for hyperlipidemia.  ?Gastroesophageal Reflux ?She complains of heartburn. She reports no abdominal pain, no chest pain, no coughing or no wheezing. This is a recurrent problem. The problem occurs occasionally. Pertinent negatives include no fatigue. She has tried a PPI for the symptoms.  ?Migraine  ?This is a recurrent problem. The pain quality is similar to prior headaches. Pertinent negatives include no abdominal pain, coughing, dizziness, fever, hearing loss, tinnitus or vomiting. She has tried beta  blockers (and fioricet) for the symptoms. Her past medical history is significant for hypertension.  ? ?Lab Results  ?Component Value Date  ? NA 139 08/24/2020  ? K 3.8 08/24/2020  ? CO2 26 08/24/2020  ? GLUCOSE 125 (H) 08/24/2020  ? BUN 15 08/24/2020  ? CREATININE 0.97 08/24/2020  ? CALCIUM 9.8 08/24/2020  ? GFRNONAA >60 08/24/2020  ? ?Lab Results  ?Component Value Date  ? CHOL 202 (H) 07/10/2020  ? HDL 71 07/10/2020  ? LDLCALC 102 (H) 07/10/2020  ? TRIG 173 (H) 07/10/2020  ? CHOLHDL 2.8 07/10/2020  ? ?Lab Results  ?Component Value Date  ? TSH 2.800 07/10/2020  ? ?Lab Results  ?Component Value Date  ? HGBA1C 5.9 (H) 07/10/2020  ? ?Lab Results  ?Component Value Date  ? WBC 13.4 (H) 08/24/2020  ? HGB 14.7 08/24/2020  ? HCT 46.1 (H) 08/24/2020  ? MCV 89.3 08/24/2020  ? PLT 270 08/24/2020  ? ?Lab Results  ?Component Value Date  ? ALT 25 07/10/2020  ? AST 27 07/10/2020  ? ALKPHOS 93 07/10/2020  ? BILITOT 0.4 07/10/2020  ? ?Lab Results  ?Component Value Date  ? VD25OH 33.5 06/25/2019  ?  ? ?Review of Systems  ?Constitutional:  Negative for chills, fatigue and fever.  ?HENT:  Negative for congestion, hearing loss, tinnitus, trouble swallowing and voice change.   ?Eyes:  Negative for visual disturbance.  ?Respiratory:  Negative for cough, chest tightness, shortness of breath and wheezing.   ?Cardiovascular:  Negative for chest pain, palpitations and leg swelling.  ?Gastrointestinal:  Positive for heartburn. Negative for abdominal pain, constipation, diarrhea and vomiting.  ?Endocrine: Negative for polydipsia and polyuria.  ?Genitourinary:  Negative for dysuria, frequency and vaginal bleeding.  ?Musculoskeletal:  Positive for arthralgias, joint  swelling and myalgias. Negative for gait problem.  ?Skin:  Negative for color change and rash.  ?Neurological:  Negative for dizziness, tremors, light-headedness and headaches.  ?Hematological:  Negative for adenopathy. Does not bruise/bleed easily.  ?Psychiatric/Behavioral:   Positive for sleep disturbance. Negative for dysphoric mood. The patient is not nervous/anxious.   ? ?Patient Active Problem List  ? Diagnosis Date Noted  ? Distal radius fracture, left 08/24/2020  ? S/P total hysterectomy 03/16/2020  ? Acute gastritis without hemorrhage   ? Dermatitis of both ear canals 03/01/2020  ? Pruritus 01/25/2020  ? Gastroesophageal reflux disease with esophagitis without hemorrhage   ? Personal history of colonic polyps   ? Polyp of ascending colon   ? Insomnia 06/25/2019  ? Osteopenia determined by x-ray 06/23/2018  ? Chronic otitis media 09/30/2017  ? Migraine without aura and without status migrainosus, not intractable 05/21/2017  ? Not currently working due to disabled status 01/24/2017  ? Degenerative arthritis 01/24/2017  ? Frequent PVCs 08/20/2016  ? Hx of colonic polyps 04/24/2016  ? Periodic headache syndrome, not intractable 01/31/2016  ? Rosacea, acne 01/31/2016  ? Essential hypertension 08/02/2015  ? Hypothyroidism due to acquired atrophy of thyroid 08/02/2015  ? Fibromyalgia 08/02/2015  ? Sjogren's syndrome ([place]) 08/02/2015  ? Peptic ulcer disease 08/02/2015  ? Primary osteoarthritis of both knees 08/02/2015  ? Osteopenia 08/02/2015  ? Other allergic rhinitis 08/02/2015  ? Hyperlipidemia, mixed 08/02/2015  ? ? ?Allergies  ?Allergen Reactions  ? Ace Inhibitors Cough  ? Morphine And Related Nausea Only  ? Savella [Milnacipran Hcl] Palpitations  ? ? ?Past Surgical History:  ?Procedure Laterality Date  ? AUGMENTATION MAMMAPLASTY Bilateral 1987  ? COLONOSCOPY  05/2012  ? polyps  ? COLONOSCOPY WITH PROPOFOL N/A 06/20/2016  ? Procedure: COLONOSCOPY WITH PROPOFOL;  Surgeon: [name], MD;  Location: [place];  Service: Endoscopy;  Laterality: N/A;  ? COLONOSCOPY WITH PROPOFOL N/A 11/11/2019  ? Procedure: COLONOSCOPY WITH PROPOFOL;  Surgeon: [name], MD;  Location: [place];  Service: Endoscopy;  Laterality: N/A;  3  ? COLONOSCOPY WITH PROPOFOL N/A  03/06/2020  ? Procedure: COLONOSCOPY WITH BIOPSY;  Surgeon: [name], MD;  Location: [place];  Service: Endoscopy;  Laterality: N/A;  ? ESOPHAGOGASTRODUODENOSCOPY (EGD) WITH PROPOFOL N/A 06/20/2016  ? Procedure: ESOPHAGOGASTRODUODENOSCOPY (EGD) WITH PROPOFOL;  Surgeon: [name], MD;  Location: [place];  Service: Endoscopy;  Laterality: N/A;  ? ESOPHAGOGASTRODUODENOSCOPY (EGD) WITH PROPOFOL N/A 11/11/2019  ? Procedure: ESOPHAGOGASTRODUODENOSCOPY (EGD) WITH PROPOFOL;  Surgeon: [name], MD;  Location: [place];  Service: Endoscopy;  Laterality: N/A;  ? ESOPHAGOGASTRODUODENOSCOPY (EGD) WITH PROPOFOL N/A 03/06/2020  ? Procedure: ESOPHAGOGASTRODUODENOSCOPY (EGD) WITH BIOPSY;  Surgeon: [name], MD;  Location: [place];  Service: Endoscopy;  Laterality: N/A;  ? FOOT SURGERY Right   ? GANGLION CYST EXCISION Left   ? NASAL SINUS SURGERY    ? NEUROMA SURGERY Left   ? OPEN REDUCTION INTERNAL FIXATION (ORIF) DISTAL RADIAL FRACTURE Left 08/24/2020  ? Procedure: OPEN REDUCTION INTERNAL FIXATION (ORIF) DISTAL RADIAL FRACTURE;  Surgeon: Corky Mull, MD;  Location: [facility];  Service: Orthopedics;  Laterality: Left;  ? PLACEMENT OF BREAST IMPLANTS    ? POLYPECTOMY  06/20/2016  ? Procedure: POLYPECTOMY;  Surgeon: [name], MD;  Location: [place];  Service: Endoscopy;;  ? POLYPECTOMY N/A 03/06/2020  ? Procedure: POLYPECTOMY;  Surgeon: [name], MD;  Location: [place];  Service: Endoscopy;  Laterality: N/A;  ? REPLACEMENT  TOTAL KNEE Right 06/2014  ? TONSILLECTOMY    ? TOTAL VAGINAL HYSTERECTOMY  1996  ? cervical dysplasia  ? ? ?Social History  ? ?Tobacco Use  ? Smoking status: Never  ? Smokeless tobacco: Never  ?Vaping Use  ? Vaping Use: Never used  ?Substance Use Topics  ? Alcohol use: No  ?  Alcohol/week: 0.0 standard drinks  ? Drug use: No  ? ? ? ?Medication list has been reviewed and updated. ? ?Current Meds  ?Medication Sig  ? albuterol  (VENTOLIN HFA) 108 (90 Base) MCG/ACT inhaler Inhale 2 puffs into the lungs every 6 (six) hours as needed for wheezing or shortness of breath.  ? aspirin 81 MG EC tablet Take 81 mg by mouth daily. Swallow whole.  ? butalb

## 2021-09-19 NOTE — Telephone Encounter (Signed)
[facility] called and stated that they can only do a 7 days supply (28 tabs) of the butalbital-apap-caffeine-codeine (FIORICET WITH CODEINE) 50-325-40-30 MG capsule at a time and stated provider would need to send a new RX and another refill to complete the 60 tabs / please advise  ?

## 2021-09-19 NOTE — Telephone Encounter (Signed)
[place] called and stated that they can only do a 7 days supply (28 tabs) of the butalbital-apap-caffeine-codeine (FIORICET WITH CODEINE) 50-325-40-30 MG capsule at a time and stated provider would need to send a new RX and another refill to complete the 60 tabs / please advise

## 2021-09-19 NOTE — Telephone Encounter (Signed)
Please review.  KP

## 2021-09-20 LAB — COMPREHENSIVE METABOLIC PANEL
ALT: 18 IU/L (ref 0–32)
AST: 20 IU/L (ref 0–40)
Albumin/Globulin Ratio: 1.6 (ref 1.2–2.2)
Albumin: 4.4 g/dL (ref 3.8–4.8)
Alkaline Phosphatase: 112 IU/L (ref 44–121)
BUN/Creatinine Ratio: 20 (ref 12–28)
BUN: 17 mg/dL (ref 8–27)
Bilirubin Total: 0.4 mg/dL (ref 0.0–1.2)
CO2: 27 mmol/L (ref 20–29)
Calcium: 9.9 mg/dL (ref 8.7–10.3)
Chloride: 96 mmol/L (ref 96–106)
Creatinine, Ser: 0.86 mg/dL (ref 0.57–1.00)
Globulin, Total: 2.7 g/dL (ref 1.5–4.5)
Glucose: 101 mg/dL — ABNORMAL HIGH (ref 70–99)
Potassium: 5.4 mmol/L — ABNORMAL HIGH (ref 3.5–5.2)
Sodium: 137 mmol/L (ref 134–144)
Total Protein: 7.1 g/dL (ref 6.0–8.5)
eGFR: 76 mL/min/{1.73_m2} (ref >59)

## 2021-09-20 LAB — LIPID PANEL
Chol/HDL Ratio: 3 ratio (ref 0.0–4.4)
Cholesterol, Total: 186 mg/dL (ref 100–199)
HDL: 62 mg/dL (ref >39)
LDL Chol Calc (NIH): 95 mg/dL (ref 0–99)
Triglycerides: 173 mg/dL — ABNORMAL HIGH (ref 0–149)
VLDL Cholesterol Cal: 29 mg/dL (ref 5–40)

## 2021-09-20 LAB — CBC WITH DIFFERENTIAL/PLATELET
Basophils Absolute: 0.1 10*3/uL (ref 0.0–0.2)
Basos: 1 %
EOS (ABSOLUTE): 0.4 10*3/uL (ref 0.0–0.4)
Eos: 4 %
Hematocrit: 44.7 % (ref 34.0–46.6)
Hemoglobin: 14.2 g/dL (ref 11.1–15.9)
Immature Grans (Abs): 0 10*3/uL (ref 0.0–0.1)
Immature Granulocytes: 0 %
Lymphocytes Absolute: 2.1 10*3/uL (ref 0.7–3.1)
Lymphs: 20 %
MCH: 27.8 pg (ref 26.6–33.0)
MCHC: 31.8 g/dL (ref 31.5–35.7)
MCV: 88 fL (ref 79–97)
Monocytes Absolute: 0.8 10*3/uL (ref 0.1–0.9)
Monocytes: 8 %
Neutrophils Absolute: 7.3 10*3/uL — ABNORMAL HIGH (ref 1.4–7.0)
Neutrophils: 67 %
Platelets: 275 10*3/uL (ref 150–450)
RBC: 5.1 x10E6/uL (ref 3.77–5.28)
RDW: 12.1 % (ref 11.7–15.4)
WBC: 10.7 10*3/uL (ref 3.4–10.8)

## 2021-09-20 LAB — HEMOGLOBIN A1C
Est. average glucose Bld gHb Est-mCnc: 120 mg/dL
Hgb A1c MFr Bld: 5.8 % — ABNORMAL HIGH (ref 4.8–5.6)

## 2021-09-21 ENCOUNTER — Other Ambulatory Visit: Payer: Self-pay | Admitting: Gastroenterology

## 2021-09-24 ENCOUNTER — Other Ambulatory Visit: Payer: Self-pay

## 2021-09-24 MED ORDER — PANTOPRAZOLE SODIUM 40 MG PO TBEC: 40.0000 mg | DELAYED_RELEASE_TABLET | Freq: Two times a day (BID) | ORAL | 0 refills | Status: DC

## 2021-10-08 DIAGNOSIS — M170 Bilateral primary osteoarthritis of knee: Secondary | ICD-10-CM | POA: Diagnosis not present

## 2021-10-08 DIAGNOSIS — M25572 Pain in left ankle and joints of left foot: Secondary | ICD-10-CM | POA: Diagnosis not present

## 2021-10-08 DIAGNOSIS — M25571 Pain in right ankle and joints of right foot: Secondary | ICD-10-CM | POA: Diagnosis not present

## 2021-10-11 ENCOUNTER — Other Ambulatory Visit: Payer: Self-pay | Admitting: Internal Medicine

## 2021-10-11 NOTE — Telephone Encounter (Signed)
Requested Prescriptions  Pending Prescriptions Disp Refills  . DULoxetine (CYMBALTA) 60 MG capsule [Pharmacy Med Name: DULoxetine HCl 60 MG Oral Capsule Delayed Release Particles] 30 capsule 11    Sig: TAKE 1 CAPSULE BY MOUTH DAILY     Psychiatry: Antidepressants - SNRI - duloxetine Passed - 10/11/2021  5:34 AM      Passed - Cr in normal range and within 360 days    Creatinine, Ser  Date Value Ref Range Status  09/19/2021 0.86 0.57 - 1.00 mg/dL Final         Passed - eGFR is 30 or above and within 360 days    GFR calc Af Amer  Date Value Ref Range Status  07/10/2020 76 >59 mL/min/1.73 Final    Comment:    **In accordance with recommendations from the NKF-ASN Task force,**   [facility] is in the process of updating its eGFR calculation to the   2021 CKD-EPI creatinine equation that estimates kidney function   without a race variable.    GFR, Estimated  Date Value Ref Range Status  08/24/2020 >60 >60 mL/min Final    Comment:    (NOTE) Calculated using the CKD-EPI Creatinine Equation (2021)    eGFR  Date Value Ref Range Status  09/19/2021 76 >59 mL/min/1.73 Final         Passed - Completed PHQ-2 or PHQ-9 in the last 360 days      Passed - Last BP in normal range    BP Readings from Last 1 Encounters:  09/19/21 128/86         Passed - Valid encounter within last 6 months    Recent Outpatient Visits          3 weeks ago Annual physical exam   [facility] Berglund, Laura H, MD   10 months ago Essential hypertension   [facility] Berg[place], Laura H, MD   1 year ago Annual physical exam   [facility] Berglund, Laura H, MD   1 year ago Cellulitis of right upper extremity   [facility] Berglund, Laura H, MD   1 year ago Hyperlipidemia, mixed   [facility] Berglund, Laura H, MD      Future Appointments            In 5 months Berglund, Laura H, MD [facility]             

## 2021-11-22 ENCOUNTER — Other Ambulatory Visit: Payer: Self-pay | Admitting: Internal Medicine

## 2021-11-22 DIAGNOSIS — J3089 Other allergic rhinitis: Secondary | ICD-10-CM

## 2021-11-22 NOTE — Telephone Encounter (Signed)
Requested Prescriptions  Pending Prescriptions Disp Refills  . montelukast (SINGULAIR) 10 MG tablet [Pharmacy Med Name: Montelukast Sodium 10 MG Oral Tablet] 90 tablet 1    Sig: TAKE 1 TABLET BY MOUTH AT  BEDTIME     Pulmonology:  Leukotriene Inhibitors Passed - 11/22/2021  5:44 AM      Passed - Valid encounter within last 12 months    Recent Outpatient Visits          2 months ago Annual physical exam   [facility] Glean Hess, MD   11 months ago Essential hypertension   [facility] Glean [facility], [facility]   1 year ago Annual physical exam   [facility] Glean Hess, MD   1 year ago Cellulitis of right upper extremity   [facility] Glean Hess, MD   1 year ago Hyperlipidemia, mixed   [facility] Glean Hess, MD      Future Appointments            In 4 months Army Melia Jesse Sans, MD [facility], [facility]

## 2021-11-26 ENCOUNTER — Other Ambulatory Visit: Payer: Self-pay | Admitting: Internal Medicine

## 2021-11-26 NOTE — Telephone Encounter (Signed)
Requested Prescriptions  Pending Prescriptions Disp Refills  . pantoprazole (PROTONIX) 40 MG tablet [Pharmacy Med Name: Pantoprazole Sodium 40 MG Oral Tablet Delayed Release] 180 tablet 3    Sig: TAKE 1 TABLET BY MOUTH TWICE  DAILY     Gastroenterology: Proton Pump Inhibitors Passed - 11/26/2021  5:32 AM      Passed - Valid encounter within last 12 months    Recent Outpatient Visits          2 months ago Annual physical exam   [facility] Glean Hess, MD   11 months ago Essential hypertension   [facility] Glean Hess, MD   1 year ago Annual physical exam   [facility] Glean Hess, MD   1 year ago Cellulitis of right upper extremity   [facility] Glean Hess, MD   1 year ago Hyperlipidemia, mixed   [facility] Glean Hess, MD      Future Appointments            In 4 months Army Melia Jesse Sans, MD [facility], [facility]

## 2021-12-28 ENCOUNTER — Other Ambulatory Visit: Payer: Self-pay | Admitting: Internal Medicine

## 2021-12-28 DIAGNOSIS — M797 Fibromyalgia: Secondary | ICD-10-CM

## 2021-12-28 NOTE — Telephone Encounter (Signed)
Requested medication (s) are due for refill today: yes  Requested medication (s) are on the active medication list: yes  Last refill:  Hydrocortisone cream 07/10/21 90 g with 0 RF, Zanaflex 09/19/21 #270 with 0 RF  Future visit scheduled: 04/01/22, seen 09/19/21  Notes to clinic:  Zanaflex not delegated and Hydrocortisone cream does not have a protocol to follow, please assess.      Requested Prescriptions  Pending Prescriptions Disp Refills   hydrocortisone 2.5 % cream [Pharmacy Med Name: HYDROCORTISONE  2.5%  CRE] 90 g 0    Sig: APPLY TOPICALLY TWICE DAILY     Off-Protocol Failed - 12/28/2021  5:17 PM      Failed - Medication not assigned to a protocol, review manually.      Passed - Valid encounter within last 12 months    Recent Outpatient Visits           3 months ago Annual physical exam   [facility] Glean Hess, MD   1 year ago Essential hypertension   [place], Laura H, MD   1 year ago Annual physical exam   [facility] Glean Hess, MD   1 year ago Cellulitis of right upper extremity   [facility] Glean Hess, MD   1 year ago Hyperlipidemia, mixed   [facility] Glean Hess, MD       Future Appointments             In 3 months Army Melia Jesse Sans, MD [facility], [place]             tiZANidine (ZANAFLEX) 4 MG tablet [Pharmacy Med Name: tiZANidine HCl 4 MG Oral Tablet] 270 tablet 0    Sig: TAKE 1 TABLET BY MOUTH 3  TIMES DAILY     Not Delegated - Cardiovascular:  Alpha-2 Agonists - tizanidine Failed - 12/28/2021  5:17 PM      Failed - This refill cannot be delegated      Passed - Valid encounter within last 6 months    Recent Outpatient Visits           3 months ago Annual physical exam   [facility] Glean Hess, MD   1 year ago Essential hypertension   [place], Laura H, MD   1 year ago Annual physical exam   [facility] Glean Hess, MD   1 year ago Cellulitis of right upper extremity   [facility] Glean Hess, MD   1 year ago Hyperlipidemia, mixed   [facility] Glean Hess, MD       Future Appointments             [place] [id number] [facility], [facility]

## 2021-12-31 ENCOUNTER — Other Ambulatory Visit: Payer: Self-pay

## 2022-01-14 DIAGNOSIS — I77810 Thoracic aortic ectasia: Secondary | ICD-10-CM | POA: Diagnosis not present

## 2022-01-14 DIAGNOSIS — I10 Essential (primary) hypertension: Secondary | ICD-10-CM | POA: Diagnosis not present

## 2022-01-16 DIAGNOSIS — E782 Mixed hyperlipidemia: Secondary | ICD-10-CM | POA: Diagnosis not present

## 2022-01-16 DIAGNOSIS — I77810 Thoracic aortic ectasia: Secondary | ICD-10-CM | POA: Insufficient documentation

## 2022-01-16 DIAGNOSIS — I493 Ventricular premature depolarization: Secondary | ICD-10-CM | POA: Diagnosis not present

## 2022-01-16 DIAGNOSIS — I10 Essential (primary) hypertension: Secondary | ICD-10-CM | POA: Diagnosis not present

## 2022-02-06 ENCOUNTER — Ambulatory Visit (INDEPENDENT_AMBULATORY_CARE_PROVIDER_SITE_OTHER): Payer: Medicare Other

## 2022-02-06 DIAGNOSIS — Z0000 Encounter for general adult medical examination without abnormal findings: Secondary | ICD-10-CM | POA: Diagnosis not present

## 2022-02-06 NOTE — Progress Notes (Signed)
Subjective:   Ashley Glover is a 61 y.o. female who presents for Medicare Annual (Subsequent) preventive examination.  I connected with  Ashley Glover on 02/06/22 by a audio enabled telemedicine application and verified that I am speaking with the correct person using two identifiers.  Patient Location: Home  Provider Location: Office/Clinic  I discussed the limitations of evaluation and management by telemedicine. The patient expressed understanding and agreed to proceed.   Review of Systems    Defer to PCP Cardiac Risk Factors include: advanced age (>52mn, >>93women)     Objective:    There were no vitals filed for this visit. There is no height or weight on file to calculate BMI.     02/06/2022    3:07 PM 08/25/2020    4:57 AM 08/24/2020    1:41 PM 06/11/2020    8:56 PM 03/06/2020    7:28 AM 11/11/2019    8:56 AM 06/21/2019   10:22 AM  Advanced Directives  Does Patient Have a Medical Advance Directive? No  No No No No No  Would patient like information on creating a medical advance directive? No - Patient declined No - Patient declined   Yes (MAU/Ambulatory/Procedural Areas - Information given) No - Patient declined Yes (MAU/Ambulatory/Procedural Areas - Information given)    Current Medications (verified) Outpatient Encounter Medications as of 02/06/2022  Medication Sig   albuterol (VENTOLIN HFA) 108 (90 Base) MCG/ACT inhaler Inhale 2 puffs into the lungs every 6 (six) hours as needed for wheezing or shortness of breath.   aspirin 81 MG EC tablet Take 81 mg by mouth daily. Swallow whole.   butalbital-apap-caffeine-codeine (FIORICET WITH CODEINE) 50-325-40-30 MG capsule Take 1 capsule by mouth every 6 (six) hours as needed for headache.   calcium-vitamin D (OSCAL WITH D) 250-125 MG-UNIT tablet Take 1 tablet by mouth daily.   celecoxib (CELEBREX) 200 MG capsule Take 1 capsule by mouth twice daily   DULoxetine (CYMBALTA) 60 MG capsule TAKE 1 CAPSULE BY MOUTH DAILY    furosemide (LASIX) 40 MG tablet Take 1 tablet (40 mg total) by mouth daily.   gabapentin (NEURONTIN) 600 MG tablet TAKE 1 TABLET BY MOUTH 3  TIMES DAILY   hydrochlorothiazide (HYDRODIURIL) 12.5 MG tablet Take 12.5 mg by mouth daily.   hydrocortisone 2.5 % cream APPLY TOPICALLY TWICE DAILY   levothyroxine (SYNTHROID) 88 MCG tablet Take 1 tablet (88 mcg total) by mouth daily before breakfast.   loratadine (CLARITIN) 10 MG tablet Take 10 mg by mouth daily.   mometasone (ELOCON) 0.1 % lotion Apply topically daily. To both ears as needed   montelukast (SINGULAIR) 10 MG tablet TAKE 1 TABLET BY MOUTH AT  BEDTIME   Multiple Vitamin (MULTIVITAMIN ADULT PO) Take 1 tablet by mouth daily.   Omega-3 Fatty Acids (FISH OIL) 1000 MG CAPS Take by mouth.   pantoprazole (PROTONIX) 40 MG tablet TAKE 1 TABLET BY MOUTH TWICE  DAILY   promethazine (PHENERGAN) 25 MG tablet Take 1 tablet (25 mg total) by mouth every 8 (eight) hours as needed for nausea or vomiting.   propranolol (INDERAL) 40 MG tablet TAKE 1 TABLET BY MOUTH TWICE  DAILY   rosuvastatin (CRESTOR) 20 MG tablet Take 20 mg by mouth at bedtime.   tiZANidine (ZANAFLEX) 4 MG tablet TAKE 1 TABLET BY MOUTH 3 TIMES  DAILY   tobramycin-dexamethasone (TOBRADEX) ophthalmic solution Place 1 drop into both eyes every 6 (six) hours.   zolpidem (AMBIEN CR) 6.25 MG CR tablet Take  1 tablet (6.25 mg total) by mouth at bedtime as needed for sleep.   No facility-administered encounter medications on file as of 02/06/2022.    Allergies (verified) Ace inhibitors, Morphine and related, and Savella [milnacipran hcl]   History: Past Medical History:  Diagnosis Date   Allergic rhinitis    Benign neoplasm of ascending colon    Benign neoplasm of cecum    Degenerative joint disease    Fibromyalgia    GERD (gastroesophageal reflux disease)    Hx of abnormal cervical Pap smear 08/02/2015   1990's - had partial hysterectomy for dysplasia and HPV    Hyperlipidemia     Hypertension    Hypothyroid    Mitral valve prolapse    states it is mild and does not see a cardiologist   Redness of both eyes 09/30/2017   Sjogrens syndrome ([place])    Past Surgical History:  Procedure Laterality Date   AUGMENTATION MAMMAPLASTY Bilateral 1987   COLONOSCOPY  05/2012   polyps   COLONOSCOPY WITH PROPOFOL N/A 06/20/2016   Procedure: COLONOSCOPY WITH PROPOFOL;  Surgeon: [name], MD;  Location: [facility];  Service: Endoscopy;  Laterality: N/A;   COLONOSCOPY WITH PROPOFOL N/A 11/11/2019   Procedure: COLONOSCOPY WITH PROPOFOL;  Surgeon: [name], MD;  Location: [facility];  Service: Endoscopy;  Laterality: N/A;  3   COLONOSCOPY WITH PROPOFOL N/A 03/06/2020   Procedure: COLONOSCOPY WITH BIOPSY;  Surgeon: [name], MD;  Location: [place];  Service: Endoscopy;  Laterality: N/A;   ESOPHAGOGASTRODUODENOSCOPY (EGD) WITH PROPOFOL N/A 06/20/2016   Procedure: ESOPHAGOGASTRODUODENOSCOPY (EGD) WITH PROPOFOL;  Surgeon: [name], MD;  Location: [place];  Service: Endoscopy;  Laterality: N/A;   ESOPHAGOGASTRODUODENOSCOPY (EGD) WITH PROPOFOL N/A 11/11/2019   Procedure: ESOPHAGOGASTRODUODENOSCOPY (EGD) WITH PROPOFOL;  Surgeon: [name], MD;  Location: [facility];  Service: Endoscopy;  Laterality: N/A;   ESOPHAGOGASTRODUODENOSCOPY (EGD) WITH PROPOFOL N/A 03/06/2020   Procedure: ESOPHAGOGASTRODUODENOSCOPY (EGD) WITH BIOPSY;  Surgeon: [name], MD;  Location: [place];  Service: Endoscopy;  Laterality: N/A;   FOOT SURGERY Right    GANGLION CYST EXCISION Left    NASAL SINUS SURGERY     NEUROMA SURGERY Left    OPEN REDUCTION INTERNAL FIXATION (ORIF) DISTAL RADIAL FRACTURE Left 08/24/2020   Procedure: OPEN REDUCTION INTERNAL FIXATION (ORIF) DISTAL RADIAL FRACTURE;  Surgeon: Corky Mull, MD;  Location: [facility];  Service: Orthopedics;  Laterality: Left;   PLACEMENT OF BREAST IMPLANTS     POLYPECTOMY  06/20/2016    Procedure: POLYPECTOMY;  Surgeon: [name], MD;  Location: [place];  Service: Endoscopy;;   POLYPECTOMY N/A 03/06/2020   Procedure: POLYPECTOMY;  Surgeon: [name], MD;  Location: [place];  Service: Endoscopy;  Laterality: N/A;   REPLACEMENT TOTAL KNEE Right 06/2014   TONSILLECTOMY     TOTAL VAGINAL HYSTERECTOMY  1996   cervical dysplasia   Family History  Problem Relation Age of Onset   Pulmonary embolism Mother    Hypertension Mother    AVM Father    Hypertension Father    Hypothyroidism Father    Breast cancer Neg Hx    Social History   Socioeconomic History   Marital status: Divorced    Spouse name: Not on file   Number of children: 1   Years of education: Not on file   Highest education level: Not on file  Occupational History   Occupation: disabled  Tobacco Use   Smoking status: Never  Smokeless tobacco: Never  Vaping Use   Vaping Use: Never used  Substance and Sexual Activity   Alcohol use: No    Alcohol/week: 0.0 standard drinks of alcohol   Drug use: No   Sexual activity: Not on file  Other Topics Concern   Not on file  Social History Narrative   Patient is on Soc Sec Disability.  She has been disabled for a number of years from Degenerative arthritis and Fibromyalgia.   Social Determinants of Health   Financial Resource Strain: Low Risk  (06/21/2019)   Overall Financial Resource Strain (CARDIA)    Difficulty of Paying Living Expenses: Not hard at all  Food Insecurity: No Food Insecurity (02/06/2022)   Hunger Vital Sign    Worried About Running Out of Food in the Last Year: Never true    Ran Out of Food in the Last Year: Never true  Transportation Needs: No Transportation Needs (06/21/2019)   PRAPARE - Hydrologist (Medical): No    Lack of Transportation (Non-Medical): No  Physical Activity: Insufficiently Active (06/21/2019)   Exercise Vital Sign    Days of Exercise per Week: 3 days    Minutes  of Exercise per Session: 30 min  Stress: No Stress Concern Present (06/21/2019)   [place]    Feeling of Stress : Not at all  Social Connections: Unknown (02/06/2022)   Social Connection and Isolation Panel [NHANES]    Frequency of Communication with Friends and Family: More than three times a week    Frequency of Social Gatherings with Friends and Family: Once a week    Attends Religious Services: Patient refused    Marine scientist or Organizations: Patient refused    Attends Archivist Meetings: Patient refused    Marital Status: Divorced    Tobacco Counseling Counseling given: Not Answered   Clinical Intake:  Pre-visit preparation completed: Yes  Pain : No/denies pain     Diabetes: No     Diabetic? No.  Interpreter Needed?: No  Information entered by :: Wyatt Haste, [place] of Daily Living    02/06/2022    3:08 PM 09/19/2021    9:30 AM  In your present state of health, do you have any difficulty performing the following activities:  Hearing? 0 0  Vision? 0 0  Difficulty concentrating or making decisions? 0 0  Walking or climbing stairs? 1 1  Dressing or bathing? 0 0  Doing errands, shopping? 0 0  Preparing Food and eating ? N   Using the Toilet? N   In the past six months, have you accidently leaked urine? N   Do you have problems with loss of bowel control? N   Managing your Medications? N   Managing your Finances? N   Housekeeping or managing your Housekeeping? N     Patient Care Team: Glean Hess, MD as PCP - General (Internal Medicine) Vear Clock, MD as Referring Physician (Orthopedic Surgery) Colleen Can, MD as Referring Physician (Orthopedic Surgery) [name], MD as Consulting Physician (Gastroenterology)  Indicate any recent Medical Services you may have received from other than [facility] providers in the past year (date may be  approximate).     Assessment:   This is a routine wellness examination for Dillie.  Hearing/Vision screen Hearing Screening - Comments:: No concerns. Vision Screening - Comments:: Wears reading glasses.  Dietary issues and exercise activities discussed: Current  Exercise Habits: Home exercise routine, Type of exercise: stretching, Time (Minutes): 30, Frequency (Times/Week): 7, Weekly Exercise (Minutes/Week): 210, Intensity: Mild   Goals Addressed   None   Depression Screen    02/06/2022    3:07 PM 09/19/2021    9:29 AM 12/13/2020    9:04 AM 07/10/2020    9:37 AM 06/21/2019   10:21 AM 12/22/2018    9:25 AM 05/26/2018   10:34 AM  PHQ 2/9 Scores  PHQ - 2 Score 0 0 0 0 0 0 0  PHQ- 9 Score '2 4 2 3  '$ 0     Fall Risk    02/06/2022    3:07 PM 09/19/2021    9:30 AM 12/13/2020    9:05 AM 07/10/2020    9:37 AM 06/21/2019   10:23 AM  [place] in the past year? 0 0 1 0 0  Number falls in past yr: 0 0 0  0  Injury with Fall? 0 0 1  0  Risk for fall due to : History of fall(s) History of fall(s) History of fall(s)  Impaired mobility;Impaired balance/gait  Follow up Falls evaluation completed Falls evaluation completed Falls evaluation completed Falls evaluation completed Falls prevention discussed    FALL RISK PREVENTION PERTAINING TO THE HOME:  Any stairs in or around the home? Yes  If so, are there any without handrails? Yes  Home free of loose throw rugs in walkways, pet beds, electrical cords, etc? Yes  Adequate lighting in your home to reduce risk of falls? Yes   ASSISTIVE DEVICES UTILIZED TO PREVENT FALLS:  Life alert? Yes  Use of a cane, walker or w/c? Yes  Grab bars in the bathroom? No  Shower chair or bench in shower? Yes  Elevated toilet seat or a handicapped toilet? Yes   Cognitive Function:        02/06/2022    3:08 PM 06/21/2019   10:26 AM  6CIT Screen  What Year? 0 points 0 points  What month? 0 points 0 points  What time? 0 points 0 points  Count  back from 20 0 points 0 points  Months in reverse 0 points 0 points  Repeat phrase 0 points 0 points  Total Score 0 points 0 points    Immunizations Immunization History  Administered Date(s) Administered   Influenza,inj,Quad PF,6+ Mos 04/24/2016, 03/04/2017, 03/11/2018   Influenza-Unspecified 03/28/2019   Tdap 09/19/2021   Zoster Recombinat (Shingrix) 08/03/2019, 11/05/2019    TDAP status: Up to date  Flu Vaccine status: Declined, Education has been provided regarding the importance of this vaccine but patient still declined. Advised may receive this vaccine at local pharmacy or Health Dept. Aware to provide a copy of the vaccination record if obtained from local pharmacy or Health Dept. Verbalized acceptance and understanding.  Covid-19 vaccine status: Declined, Education has been provided regarding the importance of this vaccine but patient still declined. Advised may receive this vaccine at local pharmacy or Health Dept.or vaccine clinic. Aware to provide a copy of the vaccination record if obtained from local pharmacy or Health Dept. Verbalized acceptance and understanding.  Qualifies for Shingles Vaccine? Yes   Zostavax completed No   Shingrix Completed?: Yes  Screening Tests Health Maintenance  Topic Date Due   HIV Screening  Never done   COVID-19 Vaccine (1) 02/22/2022 (Originally 10/19/1961)   INFLUENZA VACCINE  08/25/2022 (Originally 12/25/2021)   MAMMOGRAM  03/20/2022   COLONOSCOPY (Pts 45-35yr Insurance coverage will need to be confirmed)  03/07/2023   TETANUS/TDAP  09/20/2031   Hepatitis C Screening  Completed   Zoster Vaccines- Shingrix  Completed   Pneumococcal Vaccine 62-55 Years old  Aged Out   HPV VACCINES  Aged Out    Health Maintenance  Health Maintenance Due  Topic Date Due   HIV Screening  Never done    Colorectal cancer screening: Type of screening: Colonoscopy. Completed 03/06/2020. Repeat every 3 years  Mammogram status: Completed 03/20/2021.  Repeat every year   Lung Cancer Screening: (Low Dose CT Chest recommended if Age 29-80 years, 30 pack-year currently smoking OR have quit w/in 15years.) does not qualify.    Additional Screening:  Hepatitis C Screening: does qualify; Completed 05/21/2017  Vision Screening: Recommended annual ophthalmology exams for early detection of glaucoma and other disorders of the eye. Is the patient up to date with their annual eye exam?  No  Who is the provider or what is the name of the office in which the patient attends annual eye exams? No. If pt is not established with a provider, would they like to be referred to a provider to establish care?  N/A .   Dental Screening: Recommended annual dental exams for proper oral hygiene  Community Resource Referral / Chronic Care Management: CRR required this visit?  No   CCM required this visit?  No      Plan:     I have personally reviewed and noted the following in the patient's chart:   Medical and social history Use of alcohol, tobacco or illicit drugs  Current medications and supplements including opioid prescriptions. Patient is not currently taking opioid prescriptions. Functional ability and status Nutritional status Physical activity Advanced directives List of other physicians Hospitalizations, surgeries, and ER visits in previous 12 months Vitals Screenings to include cognitive, depression, and falls Referrals and appointments  In addition, I have reviewed and discussed with patient certain preventive protocols, quality metrics, and best practice recommendations. A written personalized care plan for preventive services as well as general preventive health recommendations were provided to patient.     Clista Bernhardt, [place]   02/06/2022   Nurse Notes: None.

## 2022-03-01 ENCOUNTER — Ambulatory Visit (INDEPENDENT_AMBULATORY_CARE_PROVIDER_SITE_OTHER): Payer: Medicare Other | Admitting: Internal Medicine

## 2022-03-01 ENCOUNTER — Other Ambulatory Visit: Payer: Self-pay | Admitting: Internal Medicine

## 2022-03-01 ENCOUNTER — Telehealth: Payer: Self-pay | Admitting: Internal Medicine

## 2022-03-01 ENCOUNTER — Encounter: Payer: Self-pay | Admitting: Internal Medicine

## 2022-03-01 ENCOUNTER — Telehealth: Payer: Self-pay

## 2022-03-01 VITALS — BP 116/64 | HR 83 | Ht 64.0 in | Wt 196.0 lb

## 2022-03-01 DIAGNOSIS — I77810 Thoracic aortic ectasia: Secondary | ICD-10-CM | POA: Diagnosis not present

## 2022-03-01 DIAGNOSIS — I10 Essential (primary) hypertension: Secondary | ICD-10-CM

## 2022-03-01 DIAGNOSIS — E034 Atrophy of thyroid (acquired): Secondary | ICD-10-CM

## 2022-03-01 DIAGNOSIS — R7303 Prediabetes: Secondary | ICD-10-CM | POA: Diagnosis not present

## 2022-03-01 DIAGNOSIS — G43009 Migraine without aura, not intractable, without status migrainosus: Secondary | ICD-10-CM

## 2022-03-01 DIAGNOSIS — Z1231 Encounter for screening mammogram for malignant neoplasm of breast: Secondary | ICD-10-CM

## 2022-03-01 DIAGNOSIS — M797 Fibromyalgia: Secondary | ICD-10-CM

## 2022-03-01 MED ORDER — PROMETHAZINE HCL 25 MG PO TABS: 25.0000 mg | ORAL_TABLET | Freq: Three times a day (TID) | ORAL | 1 refills | Status: DC | PRN

## 2022-03-01 MED ORDER — BUTALBITAL-APAP-CAFF-COD 50-325-40-30 MG PO CAPS: 1.0000 | ORAL_CAPSULE | Freq: Four times a day (QID) | ORAL | 0 refills | Status: DC | PRN

## 2022-03-01 NOTE — Telephone Encounter (Signed)
Completed PA for Fioricet with [employer] on [email or web address].  KeyErline Levine PA CASE ID: [id number]  Awaiting outcome.

## 2022-03-01 NOTE — Progress Notes (Signed)
Date:  03/01/2022   Name:  Ashley Glover   DOB:  08-13-1960   MRN:  [id number]   Chief Complaint: Hypertension, Hypothyroidism, and Diabetes  Hypertension This is a chronic problem. The problem is controlled. Associated symptoms include headaches and neck pain (most likely triggered by severe DDD in the c-spine.). Pertinent negatives include no chest pain, palpitations or shortness of breath. Past treatments include diuretics and beta blockers. Identifiable causes of hypertension include a thyroid problem.  Thyroid Problem Presents for follow-up visit. Symptoms include fatigue. Patient reports no anxiety, constipation, diarrhea or palpitations. The symptoms have been stable.  Diabetes She presents for her follow-up diabetic visit. Diabetes type: prediabetes. Hypoglycemia symptoms include headaches. Pertinent negatives for hypoglycemia include no dizziness or nervousness/anxiousness. Associated symptoms include fatigue. Pertinent negatives for diabetes include no chest pain and no weakness.  Migraine  This is a recurrent problem. The problem has been unchanged. The pain is located in the Occipital region. The pain does not radiate. The pain is moderate. Associated symptoms include neck pain (most likely triggered by severe DDD in the c-spine.). Pertinent negatives include no abdominal pain, coughing, dizziness or weakness. Treatments tried: fioricet with codeine now not covered; has tried trigger point injections; would consider Botox. Her past medical history is significant for hypertension.    Lab Results  Component Value Date   NA 137 09/19/2021   K 5.4 (H) 09/19/2021   CO2 27 09/19/2021   GLUCOSE 101 (H) 09/19/2021   BUN 17 09/19/2021   CREATININE 0.86 09/19/2021   CALCIUM 9.9 09/19/2021   EGFR 76 09/19/2021   GFRNONAA >60 08/24/2020   Lab Results  Component Value Date   CHOL 186 09/19/2021   HDL 62 09/19/2021   LDLCALC 95 09/19/2021   TRIG 173 (H) 09/19/2021   CHOLHDL 3.0  09/19/2021   Lab Results  Component Value Date   TSH 2.800 07/10/2020   Lab Results  Component Value Date   HGBA1C 5.8 (H) 09/19/2021   Lab Results  Component Value Date   WBC 10.7 09/19/2021   HGB 14.2 09/19/2021   HCT 44.7 09/19/2021   MCV 88 09/19/2021   PLT 275 09/19/2021   Lab Results  Component Value Date   ALT 18 09/19/2021   AST 20 09/19/2021   ALKPHOS 112 09/19/2021   BILITOT 0.4 09/19/2021   Lab Results  Component Value Date   VD25OH 33.5 06/25/2019     Review of Systems  Constitutional:  Positive for fatigue. Negative for appetite change, chills and unexpected weight change.  HENT:  Negative for nosebleeds.   Eyes:  Negative for visual disturbance.  Respiratory:  Negative for cough, chest tightness, shortness of breath and wheezing.   Cardiovascular:  Negative for chest pain, palpitations and leg swelling.  Gastrointestinal:  Negative for abdominal pain, constipation and diarrhea.  Musculoskeletal:  Positive for neck pain (most likely triggered by severe DDD in the c-spine.).  Neurological:  Positive for headaches. Negative for dizziness, weakness and light-headedness.  Psychiatric/Behavioral:  Negative for dysphoric mood and sleep disturbance. The patient is not nervous/anxious.     Patient Active Problem List   Diagnosis Date Noted   Prediabetes 03/01/2022   Aortic root dilatation ([place]) 01/16/2022   Distal radius fracture, left 08/24/2020   S/P total hysterectomy 03/16/2020   Acute gastritis without hemorrhage    Dermatitis of both ear canals 03/01/2020   Pruritus 01/25/2020   Gastroesophageal reflux disease with esophagitis without hemorrhage    Personal history  of colonic polyps    Polyp of ascending colon    Insomnia 06/25/2019   Osteopenia determined by x-ray 06/23/2018   Chronic otitis media 09/30/2017   Migraine without aura and without status migrainosus, not intractable 05/21/2017   Not currently working due to disabled status 01/24/2017    Degenerative arthritis 01/24/2017   Frequent PVCs 08/20/2016   Hx of colonic polyps 04/24/2016   Periodic headache syndrome, not intractable 01/31/2016   Rosacea, acne 01/31/2016   Essential hypertension 08/02/2015   Hypothyroidism due to acquired atrophy of thyroid 08/02/2015   Fibromyalgia 08/02/2015   Sjogren's syndrome ([place]) 08/02/2015   Peptic ulcer disease 08/02/2015   Primary osteoarthritis of both knees 08/02/2015   Osteopenia 08/02/2015   Other allergic rhinitis 08/02/2015   Hyperlipidemia, mixed 08/02/2015    Allergies  Allergen Reactions   Ace Inhibitors Cough   Morphine And Related Nausea Only   Savella [Milnacipran Hcl] Palpitations    Past Surgical History:  Procedure Laterality Date   AUGMENTATION MAMMAPLASTY Bilateral 1987   COLONOSCOPY  05/2012   polyps   COLONOSCOPY WITH PROPOFOL N/A 06/20/2016   Procedure: COLONOSCOPY WITH PROPOFOL;  Surgeon: [name], MD;  Location: [place];  Service: Endoscopy;  Laterality: N/A;   COLONOSCOPY WITH PROPOFOL N/A 11/11/2019   Procedure: COLONOSCOPY WITH PROPOFOL;  Surgeon: [name], MD;  Location: [place];  Service: Endoscopy;  Laterality: N/A;  3   COLONOSCOPY WITH PROPOFOL N/A 03/06/2020   Procedure: COLONOSCOPY WITH BIOPSY;  Surgeon: [name], MD;  Location: [place];  Service: Endoscopy;  Laterality: N/A;   ESOPHAGOGASTRODUODENOSCOPY (EGD) WITH PROPOFOL N/A 06/20/2016   Procedure: ESOPHAGOGASTRODUODENOSCOPY (EGD) WITH PROPOFOL;  Surgeon: [name], MD;  Location: [place];  Service: Endoscopy;  Laterality: N/A;   ESOPHAGOGASTRODUODENOSCOPY (EGD) WITH PROPOFOL N/A 11/11/2019   Procedure: ESOPHAGOGASTRODUODENOSCOPY (EGD) WITH PROPOFOL;  Surgeon: [name], MD;  Location: [place];  Service: Endoscopy;  Laterality: N/A;   ESOPHAGOGASTRODUODENOSCOPY (EGD) WITH PROPOFOL N/A 03/06/2020   Procedure: ESOPHAGOGASTRODUODENOSCOPY (EGD) WITH BIOPSY;  Surgeon:  [name], MD;  Location: [place];  Service: Endoscopy;  Laterality: N/A;   FOOT SURGERY Right    GANGLION CYST EXCISION Left    NASAL SINUS SURGERY     NEUROMA SURGERY Left    OPEN REDUCTION INTERNAL FIXATION (ORIF) DISTAL RADIAL FRACTURE Left 08/24/2020   Procedure: OPEN REDUCTION INTERNAL FIXATION (ORIF) DISTAL RADIAL FRACTURE;  Surgeon: Corky Mull, MD;  Location: [facility];  Service: Orthopedics;  Laterality: Left;   PLACEMENT OF BREAST IMPLANTS     POLYPECTOMY  06/20/2016   Procedure: POLYPECTOMY;  Surgeon: [name], MD;  Location: [place];  Service: Endoscopy;;   POLYPECTOMY N/A 03/06/2020   Procedure: POLYPECTOMY;  Surgeon: [name], MD;  Location: [place];  Service: Endoscopy;  Laterality: N/A;   REPLACEMENT TOTAL KNEE Right 06/2014   TONSILLECTOMY     TOTAL VAGINAL HYSTERECTOMY  1996   cervical dysplasia    Social History   Tobacco Use   Smoking status: Never   Smokeless tobacco: Never  Vaping Use   Vaping Use: Never used  Substance Use Topics   Alcohol use: No    Alcohol/week: 0.0 standard drinks of alcohol   Drug use: No     Medication list has been reviewed and updated.  Current Meds  Medication Sig   albuterol (VENTOLIN HFA) 108 (90 Base) MCG/ACT inhaler Inhale 2 puffs into the lungs every 6 (six) hours as needed  for wheezing or shortness of breath.   aspirin 81 MG EC tablet Take 81 mg by mouth daily. Swallow whole.   butalbital-apap-caffeine-codeine (FIORICET WITH CODEINE) 50-325-40-30 MG capsule Take 1 capsule by mouth every 6 (six) hours as needed for headache.   calcium-vitamin D (OSCAL WITH D) 250-125 MG-UNIT tablet Take 1 tablet by mouth daily.   DULoxetine (CYMBALTA) 60 MG capsule TAKE 1 CAPSULE BY MOUTH DAILY   furosemide (LASIX) 40 MG tablet Take 1 tablet (40 mg total) by mouth daily.   gabapentin (NEURONTIN) 600 MG tablet TAKE 1 TABLET BY MOUTH 3  TIMES DAILY   hydrochlorothiazide (HYDRODIURIL) 12.5 MG  tablet Take 12.5 mg by mouth daily.   hydrocortisone 2.5 % cream APPLY TOPICALLY TWICE DAILY   levothyroxine (SYNTHROID) 88 MCG tablet Take 1 tablet (88 mcg total) by mouth daily before breakfast.   loratadine (CLARITIN) 10 MG tablet Take 10 mg by mouth daily.   mometasone (ELOCON) 0.1 % lotion Apply topically daily. To both ears as needed   Multiple Vitamin (MULTIVITAMIN ADULT PO) Take 1 tablet by mouth daily.   pantoprazole (PROTONIX) 40 MG tablet TAKE 1 TABLET BY MOUTH TWICE  DAILY   promethazine (PHENERGAN) 25 MG tablet Take 1 tablet (25 mg total) by mouth every 8 (eight) hours as needed for nausea or vomiting.   propranolol (INDERAL) 40 MG tablet TAKE 1 TABLET BY MOUTH TWICE  DAILY   rosuvastatin (CRESTOR) 20 MG tablet Take 20 mg by mouth at bedtime.   tiZANidine (ZANAFLEX) 4 MG tablet TAKE 1 TABLET BY MOUTH 3 TIMES  DAILY   tobramycin-dexamethasone (TOBRADEX) ophthalmic solution Place 1 drop into both eyes every 6 (six) hours.       09/19/2021    9:29 AM 12/13/2020    9:04 AM 07/10/2020    9:38 AM  GAD 7 : Generalized Anxiety Score  Nervous, Anxious, on Edge 0 0 0  Control/stop worrying 0 0 0  Worry too much - different things 0 0 0  Trouble relaxing 0 0 0  Restless 0 0 0  Easily annoyed or irritable 0 0 0  Afraid - awful might happen 0 0 0  Total GAD 7 Score 0 0 0  Anxiety Difficulty Not difficult at all Not difficult at all        02/06/2022    3:07 PM 09/19/2021    9:29 AM 12/13/2020    9:04 AM  Depression screen PHQ 2/9  Decreased Interest 0 0 0  Down, Depressed, Hopeless 0 0 0  PHQ - 2 Score 0 0 0  Altered sleeping 1 2 1   Tired, decreased energy 1 2 1   Change in appetite 0 0 0  Feeling bad or failure about yourself  0 0 0  Trouble concentrating 0 0 0  Moving slowly or fidgety/restless 0 0 0  Suicidal thoughts 0 0 0  PHQ-9 Score 2 4 2   Difficult doing work/chores Not difficult at all Not difficult at all Not difficult at all    BP Readings from Last 3  Encounters:  03/01/22 116/64  09/19/21 128/86  12/13/20 122/86    Physical Exam Vitals and nursing note reviewed.  Constitutional:      General: She is not in acute distress.    Appearance: Normal appearance. She is well-developed.  HENT:     Head: Normocephalic and atraumatic.  Cardiovascular:     Rate and Rhythm: Normal rate and regular rhythm.  Pulmonary:     Effort: Pulmonary effort is  normal. No respiratory distress.     Breath sounds: No wheezing or rhonchi.  Musculoskeletal:     Cervical back: Tenderness present.     Right lower leg: No edema.     Left lower leg: No edema.  Lymphadenopathy:     Cervical: No cervical adenopathy.  Skin:    General: Skin is warm and dry.     Findings: No rash.  Neurological:     General: No focal deficit present.     Mental Status: She is alert and oriented to person, place, and time.  Psychiatric:        Mood and Affect: Mood normal.        Behavior: Behavior normal.     Wt Readings from Last 3 Encounters:  03/01/22 196 lb (88.9 kg)  09/19/21 196 lb 6.4 oz (89.1 kg)  12/13/20 202 lb 3.2 oz (91.7 kg)    BP 116/64   Pulse 83   Ht 5' 4"  (1.626 m)   Wt 196 lb (88.9 kg)   SpO2 94%   BMI 33.64 kg/m   Assessment and Plan: 1. Essential hypertension Clinically stable exam with well controlled BP. Tolerating medications without side effects at this time. Pt to continue current regimen and low sodium diet; benefits of regular exercise as able discussed. - Basic metabolic panel  2. Prediabetes Check labs and advise - Basic metabolic panel - Hemoglobin A1c  3. Hypothyroidism due to acquired atrophy of thyroid supplemented - TSH + free T4  4. Migraine without aura and without status migrainosus, not intractable Will attempt PA for fioricet Consider seeing Neurology for discussion of Botox. - butalbital-apap-caffeine-codeine (FIORICET WITH CODEINE) 50-325-40-30 MG capsule; Take 1-2 capsules by mouth every 6 (six) hours as  needed for headache or migraine.  Dispense: 60 capsule; Refill: 0 - promethazine (PHENERGAN) 25 MG tablet; Take 1 tablet (25 mg total) by mouth every 8 (eight) hours as needed for nausea or vomiting.  Dispense: 60 tablet; Refill: 1  5. Encounter for screening mammogram for breast cancer Reminded to schedule.  6. Aortic root dilatation ([place]) Seen in August by Dr. Ronnald Ramp. Aortic root enlargement stable by ECHO at that time 4.2/4.0 cm   Partially dictated using Editor, commissioning. Any errors are unintentional.  Halina Maidens, MD [facility]  03/01/2022

## 2022-03-01 NOTE — Telephone Encounter (Signed)
Pt states the butalbital-apap-caffeine-codeine (FIORICET WIT H CODEINE) 50-325-40-30 MG capsule Is not covered for 8 tabs /day. Insurance will cover no more than 6 / day. So Rx needs to states "maximum 6 /day"  Quantity of 42. (For the first week)  after that she can get a month.  Also, were you able to get this medicine approved through her insurance?

## 2022-03-01 NOTE — Patient Instructions (Signed)
Call [facility] to schedule your mammogram at [phone].  

## 2022-03-01 NOTE — Telephone Encounter (Signed)
Requested medication (s) are due for refill today:   Provider to review for hydrocortisone and Zanaflex, Yes for Lasix   Requested medication (s) are on the active medication list:   Yes for all 3  Future visit scheduled:   Yes  Seen today by Dr. Army Melia   Last ordered: hydrocortisone 12/31/2021 90 g, 0 refills;  Zanaflex 12/31/2021 #270, 0 refills;  Lasix 08/05/2021 #90, 0 refills  Returned because of non delegated medications and a mag. Level due for Lasix protocol.      Requested Prescriptions  Pending Prescriptions Disp Refills   hydrocortisone 2.5 % cream [Pharmacy Med Name: HYDROCORTISONE  2.5%  CRE] 90 g 0    Sig: APPLY TOPICALLY TWICE DAILY     Off-Protocol Failed - 03/01/2022  1:16 PM      Failed - Medication not assigned to a protocol, review manually.      Passed - Valid encounter within last 12 months    Recent Outpatient Visits           Today Essential hypertension   [place] [facility] at [facility], Jesse Sans, MD   5 months ago Annual physical exam   [place] [facility] at [facility], Jesse Sans, MD   1 year ago Essential hypertension   [place] [facility] at [facility], Jesse Sans, MD   1 year ago Annual physical exam   [place] [facility] at [facility], Jesse Sans, MD   2 years ago Cellulitis of right upper extremity   [place] [facility] at [facility], Jesse Sans, MD       Future Appointments             In 6 months Army Melia Jesse Sans, MD [facility] at [facility]             furosemide (LASIX) 40 MG tablet [Pharmacy Med Name: Furosemide 40 MG Oral Tablet] 90 tablet 3    Sig: TAKE 1 TABLET BY MOUTH DAILY     Cardiovascular:  Diuretics - Loop Failed - 03/01/2022  1:16 PM      Failed - K in normal range and within 180 days     Potassium  Date Value Ref Range Status  09/19/2021 5.4 (H) 3.5 - 5.2 mmol/L Final         Failed - Mg Level in normal range and within 180 days    No results found for: "MG"       Passed - Ca in normal range and within 180 days    Calcium  Date Value Ref Range Status  09/19/2021 9.9 8.7 - 10.3 mg/dL Final         Passed - Na in normal range and within 180 days    Sodium  Date Value Ref Range Status  09/19/2021 137 134 - 144 mmol/L Final         Passed - Cr in normal range and within 180 days    Creatinine, Ser  Date Value Ref Range Status  09/19/2021 0.86 0.57 - 1.00 mg/dL Final         Passed - Cl in normal range and within 180 days    Chloride  Date Value Ref Range Status  09/19/2021 96 96 - 106 mmol/L Final         Passed -  Last BP in normal range    BP Readings from Last 1 Encounters:  03/01/22 116/64         Passed - Valid encounter within last 6 months    Recent Outpatient Visits           Today Essential hypertension   [place] [facility] at [facility], Jesse Sans, MD   5 months ago Annual physical exam   [place] [facility] at [facility], Jesse Sans, MD   1 year ago Essential hypertension   [place] [facility] at [facility], Jesse Sans, MD   1 year ago Annual physical exam   [place] [facility] at [facility], Jesse Sans, MD   2 years ago Cellulitis of right upper extremity   [place] [facility] at [facility], Jesse Sans, MD       Future Appointments             In 6 months Army Melia Jesse Sans, MD [facility] and [facility] Medicine at [facility] [place]             tiZANidine (ZANAFLEX) 4 MG tablet [Pharmacy Med Name: tiZANidine HCl 4 MG Oral Tablet] 270 tablet 0    Sig: TAKE 1 TABLET BY MOUTH 3 TIMES  DAILY     Not Delegated - Cardiovascular:   Alpha-2 Agonists - tizanidine Failed - 03/01/2022  1:16 PM      Failed - This refill cannot be delegated      Passed - Valid encounter within last 6 months    Recent Outpatient Visits           Today Essential hypertension   [place] [facility] at [facility], Jesse Sans, MD   5 months ago Annual physical exam   [place] [facility] at [facility], Jesse Sans, MD   1 year ago Essential hypertension   [place] and Sports Medicine at [facility], Jesse Sans, MD   1 year ago Annual physical exam   [place] [facility] at [facility], Jesse Sans, MD   2 years ago Cellulitis of right upper extremity   [place] [facility] at [facility], Jesse Sans, MD       Future Appointments             In 6 months Army Melia, Jesse Sans, MD [place] and Sports Medicine at [facility], [facility]

## 2022-03-01 NOTE — Telephone Encounter (Signed)
Spoke with patient and informed her of this.  - Ashley Glover

## 2022-03-02 LAB — BASIC METABOLIC PANEL
BUN/Creatinine Ratio: 16 (ref 12–28)
BUN: 12 mg/dL (ref 8–27)
CO2: 22 mmol/L (ref 20–29)
Calcium: 11.2 mg/dL — ABNORMAL HIGH (ref 8.7–10.3)
Chloride: 99 mmol/L (ref 96–106)
Creatinine, Ser: 0.76 mg/dL (ref 0.57–1.00)
Glucose: 106 mg/dL — ABNORMAL HIGH (ref 70–99)
Potassium: 5.1 mmol/L (ref 3.5–5.2)
Sodium: 144 mmol/L (ref 134–144)
eGFR: 89 mL/min/{1.73_m2} (ref >59)

## 2022-03-02 LAB — TSH+FREE T4
Free T4: 1.55 ng/dL (ref 0.82–1.77)
TSH: 3.91 u[IU]/mL (ref 0.450–4.500)

## 2022-03-02 LAB — HEMOGLOBIN A1C
Est. average glucose Bld gHb Est-mCnc: 120 mg/dL
Hgb A1c MFr Bld: 5.8 % — ABNORMAL HIGH (ref 4.8–5.6)

## 2022-03-04 DIAGNOSIS — M7051 Other bursitis of knee, right knee: Secondary | ICD-10-CM | POA: Diagnosis not present

## 2022-03-04 DIAGNOSIS — M25571 Pain in right ankle and joints of right foot: Secondary | ICD-10-CM | POA: Diagnosis not present

## 2022-03-04 DIAGNOSIS — M25572 Pain in left ankle and joints of left foot: Secondary | ICD-10-CM | POA: Diagnosis not present

## 2022-03-04 DIAGNOSIS — M1712 Unilateral primary osteoarthritis, left knee: Secondary | ICD-10-CM | POA: Diagnosis not present

## 2022-04-01 ENCOUNTER — Ambulatory Visit: Payer: Medicare Other | Admitting: Internal Medicine

## 2022-07-15 ENCOUNTER — Other Ambulatory Visit: Payer: Self-pay | Admitting: Internal Medicine

## 2022-07-15 DIAGNOSIS — E034 Atrophy of thyroid (acquired): Secondary | ICD-10-CM

## 2022-07-16 NOTE — Telephone Encounter (Signed)
Requested Prescriptions  Pending Prescriptions Disp Refills   levothyroxine (SYNTHROID) 88 MCG tablet [Pharmacy Med Name: Levothyroxine Sodium 88 MCG Oral Tablet] 80 tablet 3    Sig: TAKE 1 TABLET BY MOUTH DAILY  BEFORE BREAKFAST     Endocrinology:  Hypothyroid Agents Passed - 07/15/2022  5:08 AM      Passed - TSH in normal range and within 360 days    TSH  Date Value Ref Range Status  03/01/2022 3.910 0.450 - 4.500 uIU/mL Final         Passed - Valid encounter within last 12 months    Recent Outpatient Visits           4 months ago Essential hypertension   [place] [facility] at [facility], Jesse Sans, MD   10 months ago Annual physical exam   [facility] at [facility], Jesse Sans, MD   1 year ago Essential hypertension   [place] at [place], Jesse Sans, MD   2 years ago Annual physical exam   [place] at [facility], Jesse Sans, MD   2 years ago Cellulitis of right upper extremity   [place] at [facility], Jesse Sans, MD       Future Appointments             In 2 months Army Melia, Jesse Sans, MD [place] at [facility], [facility]

## 2022-07-22 ENCOUNTER — Ambulatory Visit
Admission: RE | Admit: 2022-07-22 | Discharge: 2022-07-22 | Disposition: A | Discharge status: Home / Self Care | Payer: Medicare Other | Source: Ambulatory Visit | Attending: Internal Medicine | Admitting: Internal Medicine

## 2022-07-22 DIAGNOSIS — Z1231 Encounter for screening mammogram for malignant neoplasm of breast: Secondary | ICD-10-CM | POA: Diagnosis not present

## 2022-07-29 ENCOUNTER — Other Ambulatory Visit: Payer: Self-pay | Admitting: Internal Medicine

## 2022-07-29 DIAGNOSIS — M797 Fibromyalgia: Secondary | ICD-10-CM

## 2022-07-29 DIAGNOSIS — I10 Essential (primary) hypertension: Secondary | ICD-10-CM

## 2022-08-12 DIAGNOSIS — M25572 Pain in left ankle and joints of left foot: Secondary | ICD-10-CM | POA: Diagnosis not present

## 2022-08-12 DIAGNOSIS — M25571 Pain in right ankle and joints of right foot: Secondary | ICD-10-CM | POA: Diagnosis not present

## 2022-08-12 DIAGNOSIS — M76822 Posterior tibial tendinitis, left leg: Secondary | ICD-10-CM | POA: Diagnosis not present

## 2022-08-12 DIAGNOSIS — M76821 Posterior tibial tendinitis, right leg: Secondary | ICD-10-CM | POA: Diagnosis not present

## 2022-08-15 ENCOUNTER — Other Ambulatory Visit: Payer: Self-pay | Admitting: Internal Medicine

## 2022-08-15 DIAGNOSIS — G43009 Migraine without aura, not intractable, without status migrainosus: Secondary | ICD-10-CM

## 2022-08-15 NOTE — Telephone Encounter (Signed)
Requested medication (s) are due for refill today - yes  Requested medication (s) are on the active medication list -yes  Future visit scheduled -yes  Last refill: 03/01/22 #60  Notes to clinic: non delegated Rx  Requested Prescriptions  Pending Prescriptions Disp Refills   butalbital-apap-caffeine-codeine (FIORICET WITH CODEINE) 50-325-40-30 MG capsule [Pharmacy Med Name: Butalbital-APAP-Caff-Cod 50-325-40-30 MG Oral Capsule] 60 capsule 0    Sig: TAKE 1 TO 2 CAPSULES BY MOUTH EVERY 6 HOURS AS NEEDED FOR  HEADACHE  OR  MIGRAINE     Not Delegated - Analgesics:  Opioid Agonist Combinations 2 Failed - 08/15/2022  9:16 AM      Failed - This refill cannot be delegated      Failed - Urine Drug Screen completed in last 360 days      Failed - Valid encounter within last 3 months    Recent Outpatient Visits           5 months ago Essential hypertension   [place] at [facility], Jesse Sans, MD   11 months ago Annual physical exam   [place] at [facility], Jesse Sans, MD   1 year ago Essential hypertension   [place] at [place], Jesse Sans, MD   2 years ago Annual physical exam   [place] at [facility], Jesse Sans, MD   2 years ago Cellulitis of right upper extremity   [place] at [facility], Jesse Sans, MD       Future Appointments             In 1 month Army Melia Jesse Sans, MD [place] at [facility], [place] in normal range and within 360 days    Creatinine, Ser  Date Value Ref Range Status  03/01/2022 0.76 0.57 - 1.00 mg/dL Final         Passed - eGFR is 10 or above and within 360 days    GFR calc Af Amer  Date Value Ref Range Status  07/10/2020 76 >59 mL/min/1.73 Final    Comment:     **In accordance with recommendations from the NKF-ASN Task force,**   [facility] is in the process of updating its eGFR calculation to the   2021 CKD-EPI creatinine equation that estimates kidney function   without a race variable.    GFR, Estimated  Date Value Ref Range Status  08/24/2020 >60 >60 mL/min Final    Comment:    (NOTE) Calculated using the CKD-EPI Creatinine Equation (2021)    eGFR  Date Value Ref Range Status  03/01/2022 89 >59 mL/min/1.73 Final         Passed - Patient is not pregnant         Requested Prescriptions  Pending Prescriptions Disp Refills   butalbital-apap-caffeine-codeine (FIORICET WITH CODEINE) 50-325-40-30 MG capsule [Pharmacy Med Name: Butalbital-APAP-Caff-Cod 50-325-40-30 MG Oral Capsule] 60 capsule 0    Sig: TAKE 1 TO 2 CAPSULES BY MOUTH EVERY 6 HOURS AS NEEDED FOR  HEADACHE  OR  MIGRAINE     Not Delegated - Analgesics:  Opioid Agonist Combinations 2 Failed - 08/15/2022  9:16 AM      Failed - This refill cannot be delegated  Failed - Urine Drug Screen completed in last 360 days      Failed - Valid encounter within last 3 months    Recent Outpatient Visits           5 months ago Essential hypertension   [place] at [facility], Jesse Sans, MD   11 months ago Annual physical exam   [place] at [facility], Jesse Sans, MD   1 year ago Essential hypertension   [place] at [facility], Jesse Sans, MD   2 years ago Annual physical exam   [place] at [facility] Jesse Sans, MD   2 years ago Cellulitis of right upper extremity   [place] at [facility], Jesse Sans, MD       Future Appointments             In 1 month Army Melia Jesse Sans, MD [place] at [facility], [place] in normal range and within 360 days    Creatinine, Ser  Date Value Ref Range Status  03/01/2022 0.76 0.57 - 1.00 mg/dL Final         Passed - eGFR is 10 or above and within 360 days    GFR calc Af Amer  Date Value Ref Range Status  07/10/2020 76 >59 mL/min/1.73 Final    Comment:    **In accordance with recommendations from the NKF-ASN Task force,**   [facility] is in the process of updating its eGFR calculation to the   2021 CKD-EPI creatinine equation that estimates kidney function   without a race variable.    GFR, Estimated  Date Value Ref Range Status  08/24/2020 >60 >60 mL/min Final    Comment:    (NOTE) Calculated using the CKD-EPI Creatinine Equation (2021)    eGFR  Date Value Ref Range Status  03/01/2022 89 >59 mL/min/1.73 Final         Passed - Patient is not pregnant

## 2022-08-15 NOTE — Telephone Encounter (Signed)
Please review.  KP

## 2022-08-22 DIAGNOSIS — I10 Essential (primary) hypertension: Secondary | ICD-10-CM | POA: Diagnosis not present

## 2022-08-22 DIAGNOSIS — I77810 Thoracic aortic ectasia: Secondary | ICD-10-CM | POA: Diagnosis not present

## 2022-08-22 DIAGNOSIS — I493 Ventricular premature depolarization: Secondary | ICD-10-CM | POA: Diagnosis not present

## 2022-08-23 ENCOUNTER — Other Ambulatory Visit: Payer: Self-pay | Admitting: Internal Medicine

## 2022-08-23 DIAGNOSIS — M797 Fibromyalgia: Secondary | ICD-10-CM

## 2022-09-03 ENCOUNTER — Emergency Department: Payer: Medicare Other

## 2022-09-03 ENCOUNTER — Other Ambulatory Visit: Payer: Self-pay

## 2022-09-03 ENCOUNTER — Emergency Department
Admission: EM | Admit: 2022-09-03 | Discharge: 2022-09-03 | Disposition: A | Discharge status: Home / Self Care | Payer: Medicare Other | Attending: Emergency Medicine | Admitting: Emergency Medicine

## 2022-09-03 DIAGNOSIS — I10 Essential (primary) hypertension: Secondary | ICD-10-CM | POA: Insufficient documentation

## 2022-09-03 DIAGNOSIS — Y92009 Unspecified place in unspecified non-institutional (private) residence as the place of occurrence of the external cause: Secondary | ICD-10-CM | POA: Diagnosis not present

## 2022-09-03 DIAGNOSIS — W19XXXA Unspecified fall, initial encounter: Secondary | ICD-10-CM | POA: Diagnosis not present

## 2022-09-03 DIAGNOSIS — E039 Hypothyroidism, unspecified: Secondary | ICD-10-CM | POA: Diagnosis not present

## 2022-09-03 DIAGNOSIS — S52501A Unspecified fracture of the lower end of right radius, initial encounter for closed fracture: Secondary | ICD-10-CM

## 2022-09-03 DIAGNOSIS — S6991XA Unspecified injury of right wrist, hand and finger(s), initial encounter: Secondary | ICD-10-CM | POA: Diagnosis present

## 2022-09-03 DIAGNOSIS — S52591A Other fractures of lower end of right radius, initial encounter for closed fracture: Secondary | ICD-10-CM | POA: Diagnosis not present

## 2022-09-03 MED ORDER — SODIUM CHLORIDE 0.9 % IV BOLUS
500.0000 mL | Freq: Once | INTRAVENOUS | Status: AC
  Administered 2022-09-03: 500 mL via INTRAVENOUS

## 2022-09-03 MED ORDER — ONDANSETRON HCL 4 MG/2ML IJ SOLN
4.0000 mg | Freq: Once | INTRAMUSCULAR | Status: AC
  Administered 2022-09-03: 4 mg via INTRAVENOUS
  Filled 2022-09-03: qty 2

## 2022-09-03 MED ORDER — HYDROMORPHONE HCL 1 MG/ML IJ SOLN
0.5000 mg | Freq: Once | INTRAMUSCULAR | Status: AC
  Administered 2022-09-03: 0.5 mg via INTRAVENOUS
  Filled 2022-09-03: qty 0.5

## 2022-09-03 MED ORDER — PROPOFOL 10 MG/ML IV BOLUS
1.0000 mg/kg | Freq: Once | INTRAVENOUS | Status: DC
  Filled 2022-09-03: qty 20

## 2022-09-03 MED ORDER — HYDROCODONE-ACETAMINOPHEN 5-325 MG PO TABS: 1.0000 | ORAL_TABLET | ORAL | 0 refills | Status: DC | PRN

## 2022-09-03 MED ORDER — KETAMINE HCL 50 MG/5ML IJ SOSY
1.0000 mg/kg | PREFILLED_SYRINGE | Freq: Once | INTRAMUSCULAR | Status: AC
  Administered 2022-09-03: 86 mg via INTRAVENOUS
  Filled 2022-09-03: qty 10

## 2022-09-03 NOTE — ED Notes (Signed)
This EDT applied splint to right arm with RN Vet. EDP at bedside at this time.

## 2022-09-03 NOTE — ED Provider Notes (Signed)
[facility] Provider Note   Event Date/Time   First MD Initiated Contact with Patient 09/03/22 1532     (approximate) History  Fall  HPI Ashley Glover is a 62 y.o. female with a stated past medical history of Sjogren's syndrome, fibromyalgia, hypertension, hypothyroidism, prediabetes, and migraines who presents after a mechanical fall and at home catching her self on her right wrist and suffering an injury as well as deformity to this right wrist.  Patient states she is right-handed.  Patient reports 6/10 right wrist pain that radiates to the elbow.  Patient endorses mild paresthesias distal to this injury over the first and second digit.  Patient denies any color change other than bruising around the medial wrist ROS: Patient currently denies any vision changes, tinnitus, difficulty speaking, facial droop, sore throat, chest pain, shortness of breath, abdominal pain, nausea/vomiting/diarrhea, dysuria, or weakness/numbness in any extremity   Physical Exam  Triage Vital Signs: ED Triage Vitals  Enc Vitals Group     BP 09/03/22 1434 96/67     Pulse Rate 09/03/22 1434 70     Resp 09/03/22 1434 18     Temp 09/03/22 1434 98 F (36.7 C)     Temp Source 09/03/22 1434 Oral     SpO2 09/03/22 1434 99 %     Weight --      Height --      Head Circumference --      Peak Flow --      Pain Score 09/03/22 1433 6     Pain Loc --      Pain Edu? --      Excl. in GC? --    Most recent vital signs: Vitals:   09/03/22 1702 09/03/22 1715  BP: (!) 147/110   Pulse: 69 65  Resp: 16 18  Temp: 98.6 F (37 C)   SpO2: 100% 100%   General: Awake, oriented x4. CV:  Good peripheral perfusion.  Resp:  Normal effort.  Abd:  No distention.  Other:  Middle-aged obese Caucasian female laying in bed in moderate distress secondary to pain.  Deformity to the right wrist with dorsal angulation of the wrist.  Patient is distally neurovascularly intact. ED Results / Procedures /  Treatments  Labs (all labs ordered are listed, but only abnormal results are displayed) Labs Reviewed - No data to display RADIOLOGY ED MD interpretation: X-ray of the right wrist shows a Severely comminuted and displaced and angulated fracture of the distal right radius -Agree with radiology assessment Official radiology report(s): DG Wrist Complete Right  Result Date: 09/03/2022 CLINICAL DATA:  Trauma, fall EXAM: RIGHT WRIST - COMPLETE 3+ VIEW COMPARISON:  None Available. FINDINGS: Severely comminuted fracture is seen in the distal metaphyseal region of radius. There is dorsal displacement of distal major fracture fragments. There is over riding of fracture fragments. There is volar angulation at the fracture site. Rest of the visualized bony structures are unremarkable. IMPRESSION: Severely comminuted, displaced and angulated fracture is seen in distal right radius. Electronically Signed   By: Ernie Avena M.D.   On: 09/03/2022 15:15   PROCEDURES: Critical Care performed: Yes, see critical care procedure note(s) .1-3 Lead EKG Interpretation  Performed by: Merwyn Katos, MD Authorized by: Merwyn Katos, MD     Interpretation: normal     ECG rate:  71   ECG rate assessment: normal     Rhythm: sinus rhythm     Ectopy: none  Conduction: normal   .Ortho Injury Treatment  Date/Time: 09/03/2022 5:31 PM  Performed by: Merwyn Katos, MD Authorized by: Merwyn Katos, MD   Consent:    Consent obtained:  Verbal and emergent situation   Consent given by:  Patient   Risks discussed:  Fracture   Alternatives discussed:  No treatment, alternative treatment, immobilization, referral and delayed treatmentInjury location: wrist Location details: right wrist Injury type: fracture Fracture type: distal radius Pre-procedure neurovascular assessment: neurovascularly intact Pre-procedure range of motion: reduced  Patient sedated: Yes. Refer to sedation procedure documentation for  details of sedation. Manipulation performed: yes Skeletal traction used: yes Reduction successful: yes X-ray confirmed reduction: yes Immobilization: splint Splint type: sugar tong Splint Applied by: ED Nurse and ED Provider Supplies used: cotton padding and Ortho-Glass Post-procedure neurovascular assessment: post-procedure neurovascularly intact    MEDICATIONS ORDERED IN ED: Medications  sodium chloride 0.9 % bolus 500 mL (500 mLs Intravenous New Bag/Given 09/03/22 1559)  ondansetron (ZOFRAN) injection 4 mg (4 mg Intravenous Given 09/03/22 1550)  HYDROmorphone (DILAUDID) injection 0.5 mg (0.5 mg Intravenous Given 09/03/22 1550)  ketamine 50 mg in normal saline 5 mL (10 mg/mL) syringe (86 mg Intravenous Given 09/03/22 1650)   IMPRESSION / MDM / ASSESSMENT AND PLAN / ED COURSE  I reviewed the triage vital signs and the nursing notes.                             The patient is on the cardiac monitor to evaluate for evidence of arrhythmia and/or significant heart rate changes. Patient's presentation is most consistent with acute presentation with potential threat to life or bodily function. The Pt was found to have a closed right distal radius fracture on XR. The Pt is otherwise well appearing, hemodynamically stable, and shows no evidence of neurovascular injury or compartment syndrome.  I have low suspicion for dislocation, significant ligamentous injury, septic arthritis, gout flare, new autoimmune arthropathy, or gonococcal arthropathy. Patient was placed in right sugar-tong and will follow up with ortho.    Rx: norco  Dispo: Discharge home with orthopedic follow-up   FINAL CLINICAL IMPRESSION(S) / ED DIAGNOSES   Final diagnoses:  Fall, initial encounter  Closed fracture of distal end of right radius, unspecified fracture morphology, initial encounter   Rx / DC Orders   ED Discharge Orders          Ordered    HYDROcodone-acetaminophen (NORCO) 5-325 MG tablet  Every 4 hours PRN         09/03/22 1730           Note:  This document was prepared using Dragon voice recognition software and may include unintentional dictation errors.   Merwyn Katos, MD 09/03/22 [phone]

## 2022-09-03 NOTE — ED Triage Notes (Signed)
Pt to ED via POV from home. Pt sustained a fall catching herself on her right wrist. Pt has deformity noted to right wrist. Sensation intact. Pt reports pain radiates to elbow.

## 2022-09-09 ENCOUNTER — Telehealth: Payer: Self-pay | Admitting: *Deleted

## 2022-09-09 NOTE — Telephone Encounter (Signed)
        Patient  visited [facility] on 09/03/2022  for treatment   Telephone encounter attempt :  1st  A HIPAA compliant voice message was left requesting a return call.  Instructed patient to call back at [phone].  Yehuda Mao Greenauer -[facility] , Population [facility] [phone] [address] [place] , [place] [address] Email : [email or web address]

## 2022-09-11 ENCOUNTER — Encounter: Payer: Self-pay | Admitting: Orthopedic Surgery

## 2022-09-11 ENCOUNTER — Other Ambulatory Visit: Payer: Self-pay | Admitting: Orthopedic Surgery

## 2022-09-11 DIAGNOSIS — M25571 Pain in right ankle and joints of right foot: Secondary | ICD-10-CM | POA: Diagnosis not present

## 2022-09-11 DIAGNOSIS — S82831A Other fracture of upper and lower end of right fibula, initial encounter for closed fracture: Secondary | ICD-10-CM | POA: Diagnosis not present

## 2022-09-11 DIAGNOSIS — M25531 Pain in right wrist: Secondary | ICD-10-CM | POA: Diagnosis not present

## 2022-09-11 DIAGNOSIS — S52501A Unspecified fracture of the lower end of right radius, initial encounter for closed fracture: Secondary | ICD-10-CM | POA: Diagnosis not present

## 2022-09-12 ENCOUNTER — Other Ambulatory Visit
Admission: RE | Admit: 2022-09-12 | Discharge: 2022-09-12 | Disposition: A | Discharge status: Home / Self Care | Payer: Medicare Other | Attending: Anesthesiology | Admitting: Anesthesiology

## 2022-09-12 DIAGNOSIS — E039 Hypothyroidism, unspecified: Secondary | ICD-10-CM | POA: Diagnosis not present

## 2022-09-12 DIAGNOSIS — M3500 Sicca syndrome, unspecified: Secondary | ICD-10-CM | POA: Diagnosis not present

## 2022-09-12 DIAGNOSIS — K219 Gastro-esophageal reflux disease without esophagitis: Secondary | ICD-10-CM | POA: Diagnosis not present

## 2022-09-12 DIAGNOSIS — Z8711 Personal history of peptic ulcer disease: Secondary | ICD-10-CM | POA: Diagnosis not present

## 2022-09-12 DIAGNOSIS — I119 Hypertensive heart disease without heart failure: Secondary | ICD-10-CM | POA: Diagnosis not present

## 2022-09-12 DIAGNOSIS — M1990 Unspecified osteoarthritis, unspecified site: Secondary | ICD-10-CM | POA: Diagnosis not present

## 2022-09-12 DIAGNOSIS — W19XXXA Unspecified fall, initial encounter: Secondary | ICD-10-CM | POA: Diagnosis not present

## 2022-09-12 DIAGNOSIS — M797 Fibromyalgia: Secondary | ICD-10-CM | POA: Diagnosis not present

## 2022-09-12 DIAGNOSIS — S52501A Unspecified fracture of the lower end of right radius, initial encounter for closed fracture: Secondary | ICD-10-CM | POA: Diagnosis not present

## 2022-09-12 DIAGNOSIS — Z79899 Other long term (current) drug therapy: Secondary | ICD-10-CM | POA: Diagnosis not present

## 2022-09-12 LAB — POTASSIUM: Potassium: 4.3 mmol/L (ref 3.5–5.1)

## 2022-09-12 NOTE — Anesthesia Preprocedure Evaluation (Addendum)
Anesthesia Evaluation  Patient identified by MRN, date of birth, ID band Patient awake    Reviewed: Allergy & Precautions, H&P , NPO status , Patient's Chart, lab work & pertinent test results  Airway Mallampati: III  TM Distance: <3 FB Neck ROM: Full    Dental   Permanent upper front bridge in place, does not come out:   Pulmonary neg pulmonary ROS Seasonal allergies Allergic rhinitis   Pulmonary exam normal breath sounds clear to auscultation       Cardiovascular hypertension, Pt. on home beta blockers and Pt. on medications Normal cardiovascular exam(-) dysrhythmias  Rhythm:Regular Rate:Normal  Echo 01-14-22 for hx benign essential hypertension and aortic root dilatation    Echo report reads EF 55%, aortic root "normal" and "indeterminate" for any dilatation.  Trileaflet aortic valve  Took propranolol 0700 today   Neuro/Psych  Headaches Sjogrens  Neuromuscular disease negative neurological ROS  negative psych ROS   GI/Hepatic negative GI ROS, Neg liver ROS, PUD,GERD  Controlled and Medicated,,  Endo/Other  negative endocrine ROSHypothyroidism    Renal/GU negative Renal ROS  negative genitourinary   Musculoskeletal negative musculoskeletal ROS (+) Arthritis ,  Fibromyalgia -  Abdominal  (+) + obese  Peds negative pediatric ROS (+)  Hematology negative hematology ROS (+)   Anesthesia Other Findings Fibromyalgia  Sjogrens syndrome Hypertension Degenerative joint disease Hypothyroid  Allergic rhinitis Hyperlipidemia  Mitral valve prolapse GERD (gastroesophageal reflux disease) Redness of both eyes Benign neoplasm of ascending colon Benign neoplasm of cecum Hx of abnormal cervical Pap smear Pre-diabetes PVCs (premature ventricular contractions)     Reproductive/Obstetrics negative OB ROS                             Anesthesia Physical Anesthesia Plan  ASA: 3  Anesthesia  Plan: Regional and MAC   Post-op Pain Management:    Induction: Intravenous  PONV Risk Score and Plan:   Airway Management Planned: Natural Airway and Nasal Cannula  Additional Equipment:   Intra-op Plan:   Post-operative Plan:   Informed Consent: I have reviewed the patients History and Physical, chart, labs and discussed the procedure including the risks, benefits and alternatives for the proposed anesthesia with the patient or authorized representative who has indicated his/her understanding and acceptance.     Dental Advisory Given  Plan Discussed with: Anesthesiologist, CRNA and Surgeon  Anesthesia Plan Comments: (Patient consented for risks of anesthesia including but not limited to:  - adverse reactions to medications - damage to eyes, teeth, lips or other oral mucosa - nerve damage due to positioning  - sore throat or hoarseness - Damage to heart, brain, nerves, lungs, other parts of body or loss of life  Patient voiced understanding.)        Anesthesia Quick Evaluation

## 2022-09-13 ENCOUNTER — Ambulatory Visit: Payer: Medicare Other

## 2022-09-13 ENCOUNTER — Ambulatory Visit: Payer: Medicare Other | Admitting: Anesthesiology

## 2022-09-13 ENCOUNTER — Ambulatory Visit
Admission: RE | Admit: 2022-09-13 | Discharge: 2022-09-13 | Disposition: A | Discharge status: Home / Self Care | Payer: Medicare Other | Attending: Orthopedic Surgery | Admitting: Orthopedic Surgery

## 2022-09-13 ENCOUNTER — Other Ambulatory Visit: Payer: Self-pay

## 2022-09-13 ENCOUNTER — Encounter
Admission: RE | Disposition: A | Discharge status: Home / Self Care | Payer: Self-pay | Source: Home / Self Care | Attending: Orthopedic Surgery

## 2022-09-13 ENCOUNTER — Ambulatory Visit: Payer: Self-pay

## 2022-09-13 ENCOUNTER — Encounter: Payer: Self-pay | Admitting: Orthopedic Surgery

## 2022-09-13 DIAGNOSIS — K219 Gastro-esophageal reflux disease without esophagitis: Secondary | ICD-10-CM | POA: Diagnosis not present

## 2022-09-13 DIAGNOSIS — Z8711 Personal history of peptic ulcer disease: Secondary | ICD-10-CM | POA: Insufficient documentation

## 2022-09-13 DIAGNOSIS — M797 Fibromyalgia: Secondary | ICD-10-CM | POA: Insufficient documentation

## 2022-09-13 DIAGNOSIS — S52501A Unspecified fracture of the lower end of right radius, initial encounter for closed fracture: Secondary | ICD-10-CM | POA: Diagnosis not present

## 2022-09-13 DIAGNOSIS — I10 Essential (primary) hypertension: Secondary | ICD-10-CM | POA: Diagnosis not present

## 2022-09-13 DIAGNOSIS — M3500 Sicca syndrome, unspecified: Secondary | ICD-10-CM | POA: Insufficient documentation

## 2022-09-13 DIAGNOSIS — M1990 Unspecified osteoarthritis, unspecified site: Secondary | ICD-10-CM | POA: Diagnosis not present

## 2022-09-13 DIAGNOSIS — W19XXXA Unspecified fall, initial encounter: Secondary | ICD-10-CM | POA: Insufficient documentation

## 2022-09-13 DIAGNOSIS — E039 Hypothyroidism, unspecified: Secondary | ICD-10-CM | POA: Insufficient documentation

## 2022-09-13 DIAGNOSIS — I119 Hypertensive heart disease without heart failure: Secondary | ICD-10-CM | POA: Diagnosis not present

## 2022-09-13 DIAGNOSIS — R0602 Shortness of breath: Secondary | ICD-10-CM | POA: Diagnosis not present

## 2022-09-13 DIAGNOSIS — Z79899 Other long term (current) drug therapy: Secondary | ICD-10-CM | POA: Diagnosis not present

## 2022-09-13 DIAGNOSIS — Z01812 Encounter for preprocedural laboratory examination: Secondary | ICD-10-CM

## 2022-09-13 HISTORY — DX: Ventricular premature depolarization: I49.3

## 2022-09-13 HISTORY — DX: Prediabetes: R73.03

## 2022-09-13 HISTORY — PX: OPEN REDUCTION INTERNAL FIXATION (ORIF) DISTAL RADIAL FRACTURE: SHX5989

## 2022-09-13 SURGERY — OPEN REDUCTION INTERNAL FIXATION (ORIF) DISTAL RADIUS FRACTURE
Anesthesia: Monitor Anesthesia Care | Site: Hand | Laterality: Right

## 2022-09-13 MED ORDER — ACETAMINOPHEN 500 MG PO TABS: 1000.0000 mg | ORAL_TABLET | Freq: Once | ORAL | Status: DC

## 2022-09-13 MED ORDER — MIDAZOLAM HCL 2 MG/2ML IJ SOLN
2.0000 mg | Freq: Once | INTRAMUSCULAR | Status: AC
  Administered 2022-09-13: 2 mg via INTRAVENOUS

## 2022-09-13 MED ORDER — PROPOFOL 500 MG/50ML IV EMUL
INTRAVENOUS | Status: DC | PRN
  Administered 2022-09-13: 160 ug/kg/min via INTRAVENOUS

## 2022-09-13 MED ORDER — LACTATED RINGERS IV SOLN: INTRAVENOUS | Status: DC

## 2022-09-13 MED ORDER — ACETAMINOPHEN 160 MG/5ML PO SOLN: 960.0000 mg | Freq: Once | ORAL | Status: DC

## 2022-09-13 MED ORDER — FENTANYL CITRATE (PF) 100 MCG/2ML IJ SOLN
INTRAMUSCULAR | Status: DC | PRN
  Administered 2022-09-13: 100 ug via INTRAVENOUS

## 2022-09-13 MED ORDER — 0.9 % SODIUM CHLORIDE (POUR BTL) OPTIME
TOPICAL | Status: DC | PRN
  Administered 2022-09-13: 500 mL

## 2022-09-13 MED ORDER — OXYCODONE HCL 5 MG PO TABS
5.0000 mg | ORAL_TABLET | Freq: Once | ORAL | Status: AC | PRN
  Administered 2022-09-13: 5 mg via ORAL

## 2022-09-13 MED ORDER — ACETAMINOPHEN 10 MG/ML IV SOLN
1000.0000 mg | Freq: Once | INTRAVENOUS | Status: AC
  Administered 2022-09-13: 1000 mg via INTRAVENOUS

## 2022-09-13 MED ORDER — OXYCODONE-ACETAMINOPHEN 5-325 MG PO TABS: 1.0000 | ORAL_TABLET | ORAL | 0 refills | Status: DC | PRN

## 2022-09-13 MED ORDER — CEFAZOLIN SODIUM-DEXTROSE 2-4 GM/100ML-% IV SOLN
2.0000 g | INTRAVENOUS | Status: AC
  Administered 2022-09-13: 2 g via INTRAVENOUS

## 2022-09-13 MED ORDER — OXYCODONE HCL 5 MG/5ML PO SOLN: 5.0000 mg | Freq: Once | ORAL | Status: AC | PRN

## 2022-09-13 MED ORDER — FENTANYL CITRATE PF 50 MCG/ML IJ SOSY: 25.0000 ug | PREFILLED_SYRINGE | INTRAMUSCULAR | Status: DC | PRN

## 2022-09-13 SURGICAL SUPPLY — 36 items
APL PRP STRL LF DISP 70% ISPRP (MISCELLANEOUS) ×1
BIT DRILL 2 FAST STEP (BIT) IMPLANT
BIT DRILL 2.5X4 QC (BIT) IMPLANT
BNDG CMPR STD VLCR NS LF 5.8X4 (GAUZE/BANDAGES/DRESSINGS) ×1
BNDG ELASTIC 4X5.8 VLCR NS LF (GAUZE/BANDAGES/DRESSINGS) ×1 IMPLANT
CHLORAPREP W/TINT 26 (MISCELLANEOUS) ×1 IMPLANT
COVER LIGHT HANDLE UNIVERSAL (MISCELLANEOUS) ×2 IMPLANT
CUFF TOURN SGL QUICK 18X4 (TOURNIQUET CUFF) IMPLANT
DRAPE FLUOR MINI C-ARM 54X84 (DRAPES) ×1 IMPLANT
ELECT REM PT RETURN 9FT ADLT (ELECTROSURGICAL) ×1
ELECTRODE REM PT RTRN 9FT ADLT (ELECTROSURGICAL) ×1 IMPLANT
GAUZE SPONGE 4X4 12PLY STRL (GAUZE/BANDAGES/DRESSINGS) ×1 IMPLANT
GAUZE XEROFORM 1X8 LF (GAUZE/BANDAGES/DRESSINGS) ×2 IMPLANT
GLOVE SURG SYN 9.0  PF PI (GLOVE) ×1
GLOVE SURG SYN 9.0 PF PI (GLOVE) ×1 IMPLANT
GOWN STRL REIN 2XL XLG LVL4 (GOWN DISPOSABLE) ×1 IMPLANT
GOWN STRL REUS W/ TWL LRG LVL3 (GOWN DISPOSABLE) ×1 IMPLANT
GOWN STRL REUS W/TWL LRG LVL3 (GOWN DISPOSABLE) ×1
K-WIRE 1.6 (WIRE) ×1
K-WIRE FX5X1.6XNS BN SS (WIRE) ×1
KIT TURNOVER KIT A (KITS) ×1 IMPLANT
KWIRE FX5X1.6XNS BN SS (WIRE) IMPLANT
NS IRRIG 500ML POUR BTL (IV SOLUTION) ×1 IMPLANT
PACK EXTREMITY ARMC (MISCELLANEOUS) ×1 IMPLANT
PADDING CAST BLEND 4X4 STRL (MISCELLANEOUS) ×2 IMPLANT
PEG SUBCHONDRAL SMOOTH 2.0X14 (Peg) IMPLANT
PEG SUBCHONDRAL SMOOTH 2.0X18 (Peg) IMPLANT
PEG SUBCHONDRAL SMOOTH 2.0X20 (Peg) IMPLANT
PLATE SHORT 21.6X48.9 NRRW RT (Plate) IMPLANT
SCREW CORT 3.5X10 LNG (Screw) IMPLANT
SPLINT CAST 1 STEP 3X12 (MISCELLANEOUS) ×1 IMPLANT
SPLINT CAST 1 STEP 4X30 (MISCELLANEOUS) ×1 IMPLANT
SUT ETHILON 4-0 (SUTURE) ×1
SUT ETHILON 4-0 FS2 18XMFL BLK (SUTURE) ×1
SUT VICRYL 3-0 27IN (SUTURE) ×1 IMPLANT
SUTURE ETHLN 4-0 FS2 18XMF BLK (SUTURE) ×1 IMPLANT

## 2022-09-13 NOTE — Op Note (Signed)
09/13/2022  1:35 PM  PATIENT:  Ashley Glover  62 y.o. female  PRE-OPERATIVE DIAGNOSIS:  Closed traumatic displaced fracture of distal end of right radius, initial encounter S52.501A Acute pain of right wrist M25.531  POST-OPERATIVE DIAGNOSIS:  Closed traumatic displaced fracture of distal end of right radius, initial encounter S52.501AAcute pain of right wrist M25.531  PROCEDURE:  Procedure(s): OPEN REDUCTION INTERNAL FIXATION (ORIF) DISTAL RADIUS FRACTURE (Right)  SURGEON: Leitha Schuller, MD  ASSISTANTS: None  ANESTHESIA:   paracervical block  EBL:  Total I/O In: 200 [I.V.:200] Out: -   BLOOD ADMINISTERED:none  DRAINS: none   LOCAL MEDICATIONS USED:  NONE and OTHER block placed by anesthesia so no additional local given  SPECIMEN:  No Specimen  DISPOSITION OF SPECIMEN:  N/A  COUNTS:  YES  TOURNIQUET:   Total Tourniquet Time Documented: Forearm (Right) - 24 minutes Total: Forearm (Right) - 24 minutes   IMPLANTS: [employer] short narrow DVR plate right with multiple smooth pegs and cortical screws  DICTATION: .Dragon Dictation  patient was brought to the operating room and after the arm was prepped and draped in the usual sterile fashion after a regional block placed by anesthesia before coming back to the OR was obtained,, tourniquet having been applied to the upper forearm.  Appropriate patient identification and timeout procedures were completed.  Tourniquet was raised and a volar approach was made after traction was applied at the end of the table through fingertrap traction.  Approach is center of the FCR tendon.  Tendon sheath incised and the tendon retracted radially protect the radial artery and associated veins.  The deep fascia was incised and the pronator was elevated off the distal and proximal fragments with exposure of the fracture site.  The fracture was extra-articular and with traction applied and Freer elevator adequate alignment was obtained.  A  short standard DVR plate applied with distal first technique using a K wire to hold the plate in position position of the plate was checked in both AP and lateral projections.  When acceptable position was obtained the smooth pegs were placed drilling measuring and placing the smooth pegs in the distal fragment along with multiple multidirectional screws to make sure there is adequate fixation in the distal fragment, following this the shaft portion of the plate was brought down to the bone and 3 10 mm cortical screws were sequentially inserted they gave an essentially anatomic alignment in both AP and lateral projections.  Traction was removed and under fluoroscopic views there is no motion of the fracture.   At this point local anesthetic was infiltrated proximal to the skin incision and the wound irrigated with tourniquet let down.  Wound was then closed with 3-0 Vicryl for the deep tissue and a running 4-0 Monocryl subcuticular closure followed by Dermabond.  Dressing of 4 x 4's web roll and volar splint followed by Ace wrap applied.   PLAN OF CARE: Discharge to home after PACU  PATIENT DISPOSITION:  PACU - hemodynamically stable.

## 2022-09-13 NOTE — Anesthesia Procedure Notes (Signed)
Anesthesia Regional Block: Supraclavicular block   Pre-Anesthetic Checklist: , timeout performed,  Correct Patient, Correct Site, Correct Laterality,  Correct Procedure, Correct Position, site marked,  Risks and benefits discussed,  Surgical consent,  Pre-op evaluation,  At surgeon's request and post-op pain management  Laterality: Right  Prep: chloraprep       Needles:  Injection technique: Single-shot  Needle Type: Echogenic Stimulator Needle      Needle Gauge: 21     Additional Needles:   Procedures:, nerve stimulator,,, ultrasound used (permanent image in chart),,     Nerve Stimulator or Paresthesia:  Response: bicep contraction, 0.45 mA  Additional Responses:   Narrative:  Start time: 09/13/2022 12:18 PM End time: 09/13/2022 12:31 PM Injection made incrementally with aspirations every 5 mL.  Performed by: Personally   Additional Notes: Functioning IV was confirmed and monitors applied.  Sterile prep and drape,hand hygiene and sterile gloves were used.Ultrasound guidance: relevant anatomy identified, needle position confirmed, local anesthetic spread visualized around nerve(s)., vascular puncture avoided.  Image printed for medical record.  Negative aspiration and negative test dose prior to incremental administration of local anesthetic. The patient tolerated the procedure well. Vitals signes recorded in RN notes. Also intercostobrachial block

## 2022-09-13 NOTE — H&P (Signed)
Chief Complaint Patient presents with Right Wrist - Pain Right Ankle - Pain  Reason for Visit The patient is a 62 year old female that presented for complaints of her right wrist and her right ankle. The patient states that on September 03, 2022 she was in her garage when her right ankle rolled and she fell on outstretched right wrist. She went to the emergency room where x-rays of the right wrist showed displaced wrist fracture. She had a closed reduction and sugar-tong placement of the right wrist with improvement. The patient was given Vicodin for breakthrough pain. The patient has a history of a left wrist fracture that was fixed by Dr. Joice Lofts in March 2022. The patient is not a diabetic.  Medications Current Outpatient Medications Medication Sig Dispense Refill butalbit/acetamin/caff/codeine (FIORICET WITH CODEINE ORAL) Take 1 tablet by mouth as needed (migraines) DULoxetine (CYMBALTA) 20 MG DR capsule Take 20 mg by mouth once daily EPINEPHrine (EPIPEN) 0.3 mg/0.3 mL auto-injector Inject 0.3 mg into the muscle once as needed FUROsemide (LASIX) 40 MG tablet Take 40 mg by mouth once daily as needed gabapentin (NEURONTIN) 600 MG tablet Take 600 mg by mouth 3 (three) times daily hydroCHLOROthiazide (HYDRODIURIL) 12.5 MG tablet TAKE 1 TABLET BY MOUTH ONCE DAILY 100 tablet 1 HYDROcodone-acetaminophen (NORCO) 5-325 mg tablet Take 1 tablet by mouth every 6 (six) hours as needed for Pain for up to 30 doses 30 tablet 0 hydrocortisone 2.5 % cream continuously as needed levothyroxine (SYNTHROID, LEVOTHROID) 88 MCG tablet Take 88 mcg by mouth once daily multivitamin-lutein (CENTRUM SILVER) tablet Take 1 tablet by mouth pantoprazole (PROTONIX) 40 MG DR tablet Take 40 mg by mouth 2 (two) times daily promethazine (PHENERGAN) 25 MG tablet Take 25 mg by mouth continuously as needed propranoloL (INDERAL) 40 MG tablet Take 40 mg by mouth 2 (two) times daily rosuvastatin (CRESTOR) 20 MG tablet TAKE 1 TABLET BY  MOUTH ONCE DAILY 100 tablet 1 tiZANidine (ZANAFLEX) 4 MG tablet TAKE 1 TABLET BY MOUTH 3 TIMES DAILY tobramycin-dexAMETHasone (TOBRADEX) 0.3-0.1 % ophthalmic suspension Place 1 drop into both eyes as needed triamcinolone 0.1 % ointment APPLY TWICE A DAY AS NEEDED FOR ITCHING. AVOID FACE, GROIN AND UNDERARMS albuterol 90 mcg/actuation inhaler Inhale into the lungs continuously as needed (Patient not taking: Reported on 09/11/2022) aspirin 81 MG EC tablet Take 81 mg by mouth nightly (Patient not taking: Reported on 09/11/2022) butalbitaL-acetaminop-caf-cod 50-325-40-30 mg Take by mouth TAKE 1 CAPSULE BY MOUTH EVERY 6 (SIX) HOURS AS NEEDED FOR HEADACHE. (Patient not taking: Reported on 09/11/2022) calcium carbonate-vitamin D3 (CALTRATE 600+D) 600 mg(1,500mg ) -200 unit tablet Take 1 tablet by mouth 3 (three) times daily with meals (Patient not taking: Reported on 09/11/2022) docosahexaenoic acid-epa 120-180 mg Cap Take 1 capsule by mouth 2 (two) times daily (Patient not taking: Reported on 09/11/2022) montelukast (SINGULAIR) 10 mg tablet Take 10 mg by mouth nightly (Patient not taking: Reported on 09/11/2022)  No current facility-administered medications for this visit.  Allergies Ace inhibitors, Milnacipran, and Morphine  Histories Past Medical History: Past Medical History: Diagnosis Date Arthritis GERD (gastroesophageal reflux disease) History of degenerative disc disease History of herniated intervertebral disc Hyperlipidemia Hypertension Hypothyroidism Migraine headache Scoliosis Sjogren's syndrome (CMS/HHS-HCC) Spinal stenosis  Past Surgical History: Past Surgical History: Procedure Laterality Date AUGMENTATION MAMMAPLASTY 1987 PARTIAL HYSTERECTOMY 1996 REPLACEMENT TOTAL KNEE Right 2016 COLONOSCOPY WITH PROPOFOL. ESOPHAGOGASTRODUODENOSCOPY (EGD) WITH PROPOFOL. POLYPECTOMY. 06/20/2016 Surgeon: Midge Minium, MD ESOPHAGOGASTRODUODENOSCOPY (EGD) WITH PROPOFOL 11/11/2019 Surgeon:  Midge Minium, MD COLONOSCOPY WITH PROPOFOL. ESOPHAGOGASTRODUODENOSCOPY (EGD) WITH PROPOFOL. POLYPECTOMY. 03/06/2020  Surgeon: Midge Minium, MD Open reduction and internal fixation of left distal radial shaft fracture, closed reduction and percutaneous pinning of unstable distal ulnar fracture Left 08/24/2020 Dr.Poggi bilateral foot surgery EXCISION GANGLION CYST WRIST PRIMARY Left neuroma removal TONSILLECTOMY  Social History: Social History  Socioeconomic History Marital status: Divorced Tobacco Use Smoking status: Never Smokeless tobacco: Never Vaping Use Vaping status: Never Used Substance and Sexual Activity Alcohol use: Not Currently Drug use: Never Sexual activity: Defer  Social Determinants of Health  Financial Resource Strain: Low Risk (06/21/2019) Received from [facility], [place] Financial Resource Strain (CARDIA) Difficulty of Paying Living Expenses: Not hard at all Food Insecurity: No Food Insecurity (02/06/2022) Received from [facility], [place] Hunger Vital Sign Worried About Running Out of Food in the Last Year: Never true Ran Out of Food in the Last Year: Never true Transportation Needs: No Transportation Needs (06/21/2019) Received from [facility], [place] PRAPARE - Transportation Lack of Transportation (Medical): No Lack of Transportation (Non-Medical): No Physical Activity: Insufficiently Active (06/21/2019) Received from [facility] [place] Exercise Vital Sign Days of Exercise per Week: 3 days Minutes of Exercise per Session: 30 min Stress: No Stress Concern Present (06/21/2019) Received from [facility] of Occupational Health - Occupational Stress Questionnaire Feeling of Stress : Not at all Social Connections: Unknown (02/06/2022) Received from [facility] [place] Social Connection and Isolation Panel [NHANES] Frequency of Communication with Friends and Family: More than three times a  week Frequency of Social Gatherings with Friends and Family: Once a week Attends Religious Services: Patient declined Database administrator or Organizations: Patient declined Attends Engineer, structural: Patient declined Marital Status: Divorced  Family History: Family History Problem Relation Name Age of Onset High blood pressure (Hypertension) Mother Pulmonary embolism Mother High blood pressure (Hypertension) Father Heart disease Father AVM Father  Review of Systems A comprehensive 14 point ROS was performed, reviewed, and the pertinent orthopaedic findings are documented in the HPI.  Exam BP 118/88  Ht 165.1 cm (5\' 5" )  Wt 86.2 kg (190 lb)  BMI 31.62 kg/m  General/Constitutional: NAD, conversant Eyes: Pupils equal and round, extraocular movements intact ENT: atraumatic external nose and ears, moist mucous membranes Respiratory: non-labored breathing, symmetric chest rise, chest sounds clear. Cardiovascular: no visible lower extremity edema, peripheral pulses present, regular rate and rhythm Skin: normal skin turgor, warm and dry Neurological: cranial nerves grossly intact, sensation grossly intact Psychological: Appropriate mood and affect; appropriate judgment Musculoskeletal: as detailed below:  General: Well developed, well nourished 62 y.o. female in no apparent distress. Normal affect. Normal communication. Patient answers questions appropriately. The patient has a normal gait. There is no antalgic component. There is no hip lurch.  Heart: Examination of the heart reveals regular, rate, and rhythm. There is no murmur noted on ascultation. There is a normal apical pulse.  Lungs: Lungs are clear to auscultation. There is no wheeze, rhonchi, or crackles. There is normal expansion of bilateral chest walls.  Right upper extremity: Examination of right wrist and arm reveals a sugar-tong splint in good position. This was not removed for good protection. The  patient has mild edema into the fingers. She has limited grip strength. She does have neurovascular that is intact involving her finger and thumb. She has normal sensation.  Right lower extremity: Examination right leg and ankle reveals positive diffuse edema. She has medial and lateral malleolus tenderness. Her ankle range of motion is 20 degrees dorsiflexion and  45 degrees full flexion. She has inversion and eversion that are limited. There is no fracture motion noted. She has neurovascular that is intact.  Radiology: Xrays of the right wrist were ordered and interpreted 09/11/2022, with 3 views using AP, lateral, oblique views. Xrays revealed the right distal radial fracture with an impaction to the level of the ulna and slight translation. There is no ulnar fracture noted. There is no callus formation. There is comminution of the fracture fragment.  Radiology: Xrays of the right ankle were ordered and interpreted 09/11/2022, with 3 views using AP, lateral, mortise views. Xrays revealed right ankle with a distal fibula nondisplaced oblique fracture above the level of the talus. There is no widening or deformity  Impression Encounter Diagnoses Name Primary? Closed traumatic displaced fracture of distal end of right radius, initial encounter Yes Acute pain of right wrist Acute right ankle pain Adult BMI 31.0-31.9 kg/sq m Traumatic closed nondisplaced fracture of distal fibula, right, initial encounter  Plan 1. I discussed the physical exam finding as well as the x-ray results with the patient and Dr. Rosita Kea. 2. We discussed doing a right distal radius fracture ORIF with volar plating to be done by Dr. Rosita Kea on September 13, 2022 in [place]. I reviewed the risks, benefits, and alternatives to the surgery. The patient seems understand and wants to set up for that day. 3. The patient will stay in her sugar-tong splint. 4. She has hydrocodone for breakthrough pain. 5. The patient was placed into a short  ankle boot for better support and stability of her right ankle fracture. This was dispensed in the office. 6. The patient was placed into a shoulder sling on the right side for better support of her sugar-tong splint. This was dispensed in the office. 7. The patient follow-up after surgery for postoperative care.  This note will serve as the history and physical for surgery scheduled on September 13, 2022 for a right distal radius fracture ORIF to be done by Dr. Rosita Kea.  This note was generated in part with voice recognition software and I apologize for any typographical errors that were not detected and corrected.  Myrna Blazer, M.S.   Electronically signed by Edilia Bo, PA at 09/11/2022 10:45 AM EDT    Reviewed  H+P. No changes noted.

## 2022-09-13 NOTE — Transfer of Care (Signed)
Immediate Anesthesia Transfer of Care Note  Patient: Ashley Glover  Procedure(s) Performed: OPEN REDUCTION INTERNAL FIXATION (ORIF) DISTAL RADIUS FRACTURE (Right: Hand)  Patient Location: PACU  Anesthesia Type: Regional, MAC  Level of Consciousness: awake, alert  and patient cooperative  Airway and Oxygen Therapy: Patient Spontanous Breathing and Patient connected to supplemental oxygen  Post-op Assessment: Post-op Vital signs reviewed, Patient's Cardiovascular Status Stable, Respiratory Function Stable, Patent Airway and No signs of Nausea or vomiting  Post-op Vital Signs: Reviewed and stable  Complications: No notable events documented.

## 2022-09-13 NOTE — Progress Notes (Signed)
Sats 88-90% on RA, MD notified, note new orders

## 2022-09-13 NOTE — Progress Notes (Signed)
Assisted [place] ANMD with right, supraclavicular and intercostal brachial block. Side rails up, monitors on throughout procedure. See vital signs in flow sheet. Tolerated Procedure well.

## 2022-09-13 NOTE — Discharge Instructions (Signed)
Keep arm elevated is much as you can over the weekend Keep dressing clean and dry Work your fingers all you can Pain medicine as directed Call office if you are having any problems  [phone]

## 2022-09-13 NOTE — Anesthesia Postprocedure Evaluation (Signed)
Anesthesia Post Note  Patient: Ashley Glover  Procedure(s) Performed: OPEN REDUCTION INTERNAL FIXATION (ORIF) DISTAL RADIUS FRACTURE (Right: Hand)  Patient location during evaluation: PACU Anesthesia Type: Regional Level of consciousness: awake and alert Pain management: pain level controlled Vital Signs Assessment: post-procedure vital signs reviewed and stable Respiratory status: spontaneous breathing, nonlabored ventilation, respiratory function stable and patient connected to nasal cannula oxygen Cardiovascular status: stable and blood pressure returned to baseline Postop Assessment: no apparent nausea or vomiting Anesthetic complications: no Comments: Initial concern due to pulse ox 88% to 90%, and patient seemed to be breathing more heavily. Patient denied dyspnea, breath sounds were equal and clear bilaterally. When patient had supraclavicular block, the ultrasound showed needle in good position well away from pleura. However, since patient had supraclavicular block, decision made to obtain portable CXR.  This was done and radiologist reads as basilar atelectasis, but no pneumothrorax. Also, patient is much more awake and oxygen saturation 96% on room air, eupnea.   Patient discharged home with relative.    No notable events documented.   Last Vitals:  Vitals:   09/13/22 1359 09/13/22 1400  BP:  (!) 155/102  Pulse:  66  Resp:    Temp:  (!) 36.3 C  SpO2: 93% 93%    Last Pain:  Vitals:   09/13/22 1400  TempSrc:   PainSc: 0-No pain                 Ashley Glover

## 2022-09-16 ENCOUNTER — Encounter: Payer: Self-pay | Admitting: Orthopedic Surgery

## 2022-09-17 DIAGNOSIS — Z9889 Other specified postprocedural states: Secondary | ICD-10-CM | POA: Diagnosis not present

## 2022-09-17 DIAGNOSIS — S52501D Unspecified fracture of the lower end of right radius, subsequent encounter for closed fracture with routine healing: Secondary | ICD-10-CM | POA: Diagnosis not present

## 2022-09-17 DIAGNOSIS — M25561 Pain in right knee: Secondary | ICD-10-CM | POA: Diagnosis not present

## 2022-09-17 DIAGNOSIS — S83411A Sprain of medial collateral ligament of right knee, initial encounter: Secondary | ICD-10-CM | POA: Diagnosis not present

## 2022-09-17 DIAGNOSIS — Z8781 Personal history of (healed) traumatic fracture: Secondary | ICD-10-CM | POA: Diagnosis not present

## 2022-09-25 ENCOUNTER — Encounter: Payer: Medicare Other | Admitting: Internal Medicine

## 2022-10-16 DIAGNOSIS — S52501D Unspecified fracture of the lower end of right radius, subsequent encounter for closed fracture with routine healing: Secondary | ICD-10-CM | POA: Diagnosis not present

## 2022-10-16 DIAGNOSIS — Z8781 Personal history of (healed) traumatic fracture: Secondary | ICD-10-CM | POA: Diagnosis not present

## 2022-10-16 DIAGNOSIS — Z9889 Other specified postprocedural states: Secondary | ICD-10-CM | POA: Diagnosis not present

## 2022-10-16 DIAGNOSIS — M25571 Pain in right ankle and joints of right foot: Secondary | ICD-10-CM | POA: Diagnosis not present

## 2022-10-24 ENCOUNTER — Other Ambulatory Visit: Payer: Self-pay | Admitting: Internal Medicine

## 2022-10-24 NOTE — Telephone Encounter (Signed)
Requested Prescriptions  Pending Prescriptions Disp Refills   DULoxetine (CYMBALTA) 60 MG capsule [Pharmacy Med Name: DULoxetine HCl 60 MG Oral Capsule Delayed Release Particles] 90 capsule 0    Sig: TAKE 1 CAPSULE BY MOUTH DAILY     Psychiatry: Antidepressants - SNRI - duloxetine Failed - 10/24/2022  4:19 AM      Failed - Last BP in normal range    BP Readings from Last 1 Encounters:  09/13/22 (!) 155/102         Failed - Valid encounter within last 6 months    Recent Outpatient Visits           7 months ago Essential hypertension   [place] [facility] at [facility], Nyoka Cowden, MD   1 year ago Annual physical exam   [facility] Primary Care & Sports Medicine at [facility], Nyoka Cowden, MD   1 year ago Essential hypertension   [place] [facility] at [facility], Nyoka Cowden, MD   2 years ago Annual physical exam   [facility] & Sports Medicine at [facility], Nyoka Cowden, MD   2 years ago Cellulitis of right upper extremity   [place] [facility] at [facility], Nyoka Cowden, MD       Future Appointments             In 2 months Reubin Milan, MD [facility] Health Primary Care & Sports Medicine at [facility], [facility]            Passed - Cr in normal range and within 360 days    Creatinine, Ser  Date Value Ref Range Status  03/01/2022 0.76 0.57 - 1.00 mg/dL Final         Passed - eGFR is 30 or above and within 360 days    GFR calc Af Amer  Date Value Ref Range Status  07/10/2020 76 >59 mL/min/1.73 Final    Comment:    **In accordance with recommendations from the NKF-ASN Task force,**   [facility] is in the process of updating its eGFR calculation to the   2021 CKD-EPI creatinine equation that estimates kidney function   without a race variable.    GFR, Estimated  Date Value Ref Range Status  08/24/2020 >60 >60 mL/min Final     Comment:    (NOTE) Calculated using the CKD-EPI Creatinine Equation (2021)    eGFR  Date Value Ref Range Status  03/01/2022 89 >59 mL/min/1.73 Final         Passed - Completed PHQ-2 or PHQ-9 in the last 360 days       pantoprazole (PROTONIX) 40 MG tablet [Pharmacy Med Name: Pantoprazole Sodium 40 MG Oral Tablet Delayed Release] 180 tablet 0    Sig: TAKE 1 TABLET BY MOUTH TWICE  DAILY     Gastroenterology: Proton Pump Inhibitors Passed - 10/24/2022  4:19 AM      Passed - Valid encounter within last 12 months    Recent Outpatient Visits           7 months ago Essential hypertension   [place] [facility] at [facility], Nyoka Cowden, MD   1 year ago Annual physical exam   [facility] Primary Care & Sports Medicine at [facility], Nyoka Cowden, MD   1 year ago Essential hypertension   [facility] Primary Care & Sports  Medicine at [facility], Nyoka Cowden, MD   2 years ago Annual physical exam   [facility] Primary Care & Sports Medicine at [facility], Nyoka Cowden, MD   2 years ago Cellulitis of right upper extremity   [place] [facility] at [facility], Nyoka Cowden, MD       Future Appointments             In 2 months Judithann Graves, Nyoka Cowden, MD [facility] Health Primary Care & Sports Medicine at [facility], [facility]

## 2022-11-12 ENCOUNTER — Other Ambulatory Visit: Payer: Self-pay | Admitting: Internal Medicine

## 2022-11-12 DIAGNOSIS — I10 Essential (primary) hypertension: Secondary | ICD-10-CM

## 2022-11-13 DIAGNOSIS — Z9889 Other specified postprocedural states: Secondary | ICD-10-CM | POA: Diagnosis not present

## 2022-11-13 DIAGNOSIS — Z8781 Personal history of (healed) traumatic fracture: Secondary | ICD-10-CM | POA: Diagnosis not present

## 2022-11-13 DIAGNOSIS — S52501D Unspecified fracture of the lower end of right radius, subsequent encounter for closed fracture with routine healing: Secondary | ICD-10-CM | POA: Diagnosis not present

## 2022-11-13 NOTE — Telephone Encounter (Signed)
Unable to refill per protocol, Rx expired. Discontinued 09/13/22.  Requested Prescriptions  Pending Prescriptions Disp Refills   hydrocortisone 2.5 % cream [Pharmacy Med Name: HYDROCORTISONE  2.5%  CRE] 90 g 0    Sig: APPLY TOPICALLY TO AFFECTED  AREA(S) TOPICALLY TWICE DAILY     Off-Protocol Failed - 11/12/2022  6:03 PM      Failed - Medication not assigned to a protocol, review manually.      Passed - Valid encounter within last 12 months    Recent Outpatient Visits           8 months ago Essential hypertension   [place] [facility] at [facility], Nyoka Cowden, MD   1 year ago Annual physical exam   [place] [facility] Medicine at [facility], Nyoka Cowden, MD   1 year ago Essential hypertension   [place] [facility] at [facility], Nyoka Cowden, MD   2 years ago Annual physical exam   [place] [facility] Medicine at [facility], Nyoka Cowden, MD   2 years ago Cellulitis of right upper extremity   [place] [facility] Medicine at [facility], Nyoka Cowden, MD       Future Appointments             In 1 month Judithann Graves Nyoka Cowden, MD [facility] Health Primary Care & Sports Medicine at [facility], [facility]            Signed Prescriptions Disp Refills   furosemide (LASIX) 40 MG tablet 100 tablet 0    Sig: TAKE 1 TABLET BY MOUTH DAILY     Cardiovascular:  Diuretics - Loop Failed - 11/12/2022  6:03 PM      Failed - Ca in normal range and within 180 days    Calcium  Date Value Ref Range Status  03/01/2022 11.2 (H) 8.7 - 10.3 mg/dL Final         Failed - Na in normal range and within 180 days    Sodium  Date Value Ref Range Status  03/01/2022 144 134 - 144 mmol/L Final         Failed - Cr in normal range and within 180 days    Creatinine, Ser  Date Value Ref Range Status  03/01/2022 0.76 0.57 - 1.00 mg/dL Final         Failed - Cl in normal  range and within 180 days    Chloride  Date Value Ref Range Status  03/01/2022 99 96 - 106 mmol/L Final         Failed - Mg Level in normal range and within 180 days    No results found for: "MG"       Failed - Last BP in normal range    BP Readings from Last 1 Encounters:  09/13/22 (!) 155/102         Failed - Valid encounter within last 6 months    Recent Outpatient Visits           8 months ago Essential hypertension   [place] [facility] at [facility], Nyoka Cowden, MD   1 year ago Annual physical exam   [facility] Health Primary Care & Sports Medicine at [facility], Nyoka Cowden, MD   1 year ago Essential hypertension   [place] [facility] Sports Medicine at [facility]  Charlann Boxer, MD   2 years ago Annual physical exam   [facility] Health Primary Care & Sports Medicine at [facility], Nyoka Cowden, MD   2 years ago Cellulitis of right upper extremity   [place] [facility] Sports Medicine at [facility], Nyoka Cowden, MD       Future Appointments             In 1 month Judithann Graves Nyoka Cowden, MD [facility] Health Primary Care & Sports Medicine at [facility], [facility]            Passed - K in normal range and within 180 days    Potassium  Date Value Ref Range Status  09/12/2022 4.3 3.5 - 5.1 mmol/L Final    Comment:    Performed at [facility], [address], [place], [place] [address]

## 2022-11-13 NOTE — Telephone Encounter (Signed)
Requested Prescriptions  Pending Prescriptions Disp Refills   furosemide (LASIX) 40 MG tablet [Pharmacy Med Name: Furosemide 40 MG Oral Tablet] 100 tablet 0    Sig: TAKE 1 TABLET BY MOUTH DAILY     Cardiovascular:  Diuretics - Loop Failed - 11/12/2022  6:03 PM      Failed - Ca in normal range and within 180 days    Calcium  Date Value Ref Range Status  03/01/2022 11.2 (H) 8.7 - 10.3 mg/dL Final         Failed - Na in normal range and within 180 days    Sodium  Date Value Ref Range Status  03/01/2022 144 134 - 144 mmol/L Final         Failed - Cr in normal range and within 180 days    Creatinine, Ser  Date Value Ref Range Status  03/01/2022 0.76 0.57 - 1.00 mg/dL Final         Failed - Cl in normal range and within 180 days    Chloride  Date Value Ref Range Status  03/01/2022 99 96 - 106 mmol/L Final         Failed - Mg Level in normal range and within 180 days    No results found for: "MG"       Failed - Last BP in normal range    BP Readings from Last 1 Encounters:  09/13/22 (!) 155/102         Failed - Valid encounter within last 6 months    Recent Outpatient Visits           8 months ago Essential hypertension   [place] [facility] at [facility], Nyoka Cowden, MD   1 year ago Annual physical exam   [facility] at [facility], Nyoka Cowden, MD   1 year ago Essential hypertension   [place] [facility] at [facility], Nyoka Cowden, MD   2 years ago Annual physical exam   [facility] at [facility], Nyoka Cowden, MD   2 years ago Cellulitis of right upper extremity   [place] [facility] at [facility], Nyoka Cowden, MD       Future Appointments             In 1 month Judithann Graves Nyoka Cowden, MD [facility] at [facility]            Passed - K in  normal range and within 180 days    Potassium  Date Value Ref Range Status  09/12/2022 4.3 3.5 - 5.1 mmol/L Final    Comment:    Performed at [facility], [address] [place], [place] [address]          hydrocortisone 2.5 % cream [Pharmacy Med Name: HYDROCORTISONE  2.5%  CRE] 90 g 0    Sig: APPLY TOPICALLY TO AFFECTED  AREA(S) TOPICALLY TWICE DAILY     Off-Protocol Failed - 11/12/2022  6:03 PM      Failed - Medication not assigned to a protocol, review manually.      Passed - Valid encounter within last 12 months    Recent Outpatient Visits           8 months ago Essential hypertension   [place] [facility]  at [facility], Nyoka Cowden, MD   1 year ago Annual physical exam   [facility] at [facility], Nyoka Cowden, MD   1 year ago Essential hypertension   [place] [facility] at [facility], Nyoka Cowden, MD   2 years ago Annual physical exam   [facility] at [facility], Nyoka Cowden, MD   2 years ago Cellulitis of right upper extremity   [place] [facility] at [facility], Nyoka Cowden, MD       Future Appointments             In 1 month Judithann Graves, Nyoka Cowden, MD [facility] Medicine at [facility]

## 2022-11-18 DIAGNOSIS — M25572 Pain in left ankle and joints of left foot: Secondary | ICD-10-CM | POA: Diagnosis not present

## 2022-11-18 DIAGNOSIS — M7051 Other bursitis of knee, right knee: Secondary | ICD-10-CM | POA: Diagnosis not present

## 2022-11-18 DIAGNOSIS — M1712 Unilateral primary osteoarthritis, left knee: Secondary | ICD-10-CM | POA: Diagnosis not present

## 2022-11-18 DIAGNOSIS — M25571 Pain in right ankle and joints of right foot: Secondary | ICD-10-CM | POA: Diagnosis not present

## 2022-11-26 DIAGNOSIS — M6281 Muscle weakness (generalized): Secondary | ICD-10-CM | POA: Diagnosis not present

## 2022-11-26 DIAGNOSIS — M25561 Pain in right knee: Secondary | ICD-10-CM | POA: Diagnosis not present

## 2022-12-12 IMAGING — CR DG FOREARM 2V*L*
1 series · 3 of 3 positions shown · non-contrast
Comparison: None.

CLINICAL DATA: Fall, left forearm deformity

EXAM:
LEFT FOREARM - 2 VIEW

[Series 1: dg forearm left · 0.14mm/px · 3 of 3 slices shown]
[im 1/3]
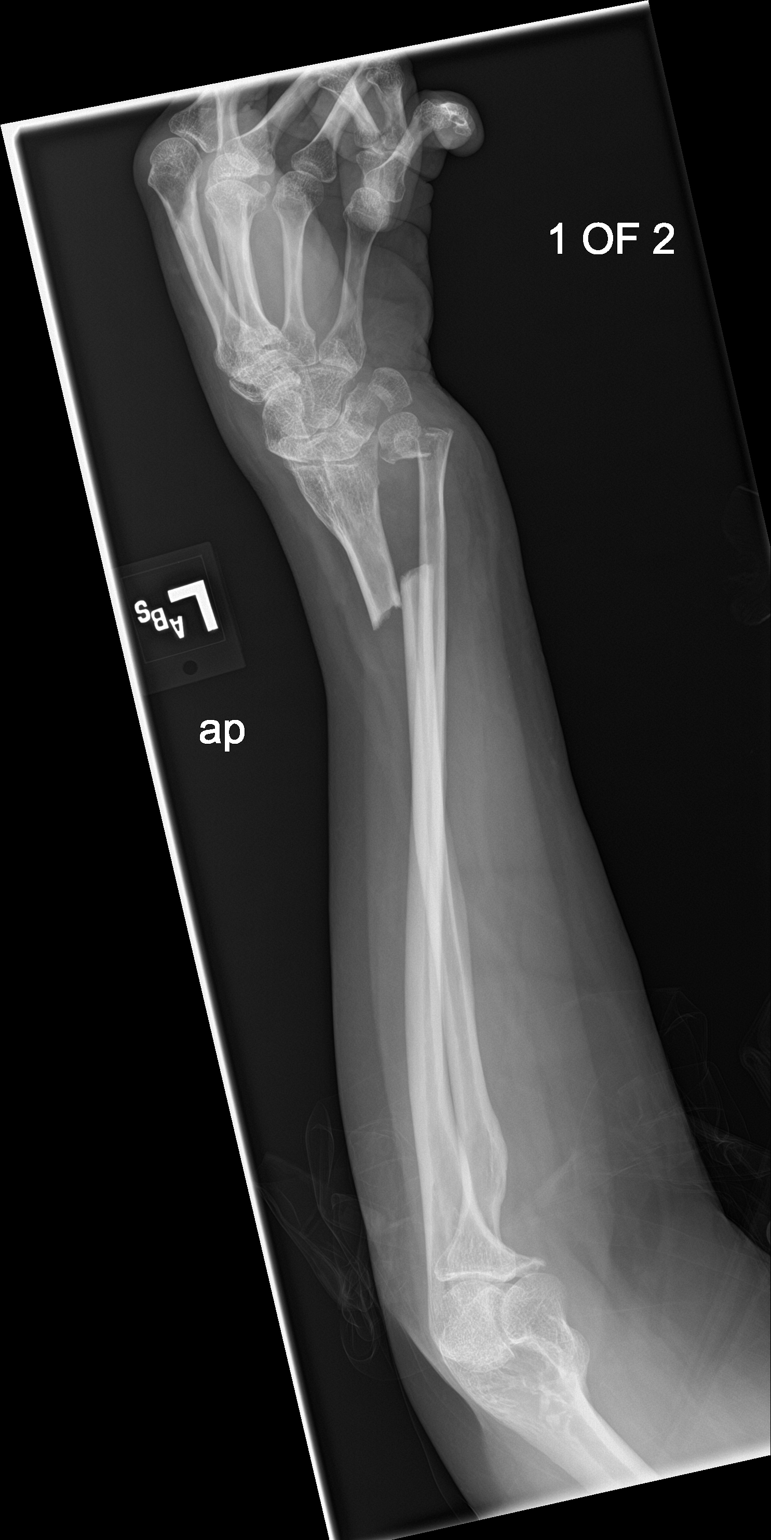
[im 2/3]
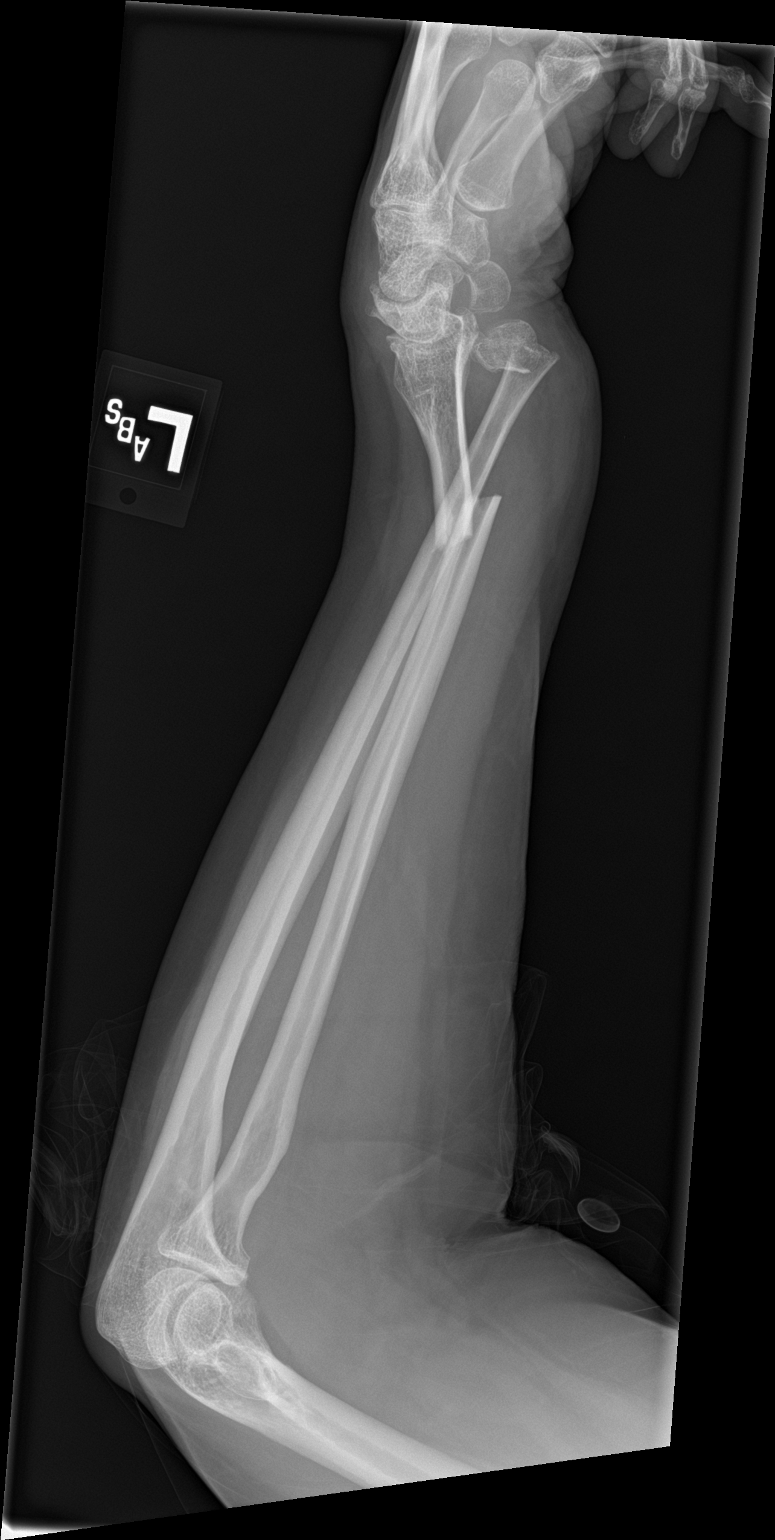
[im 3/3]
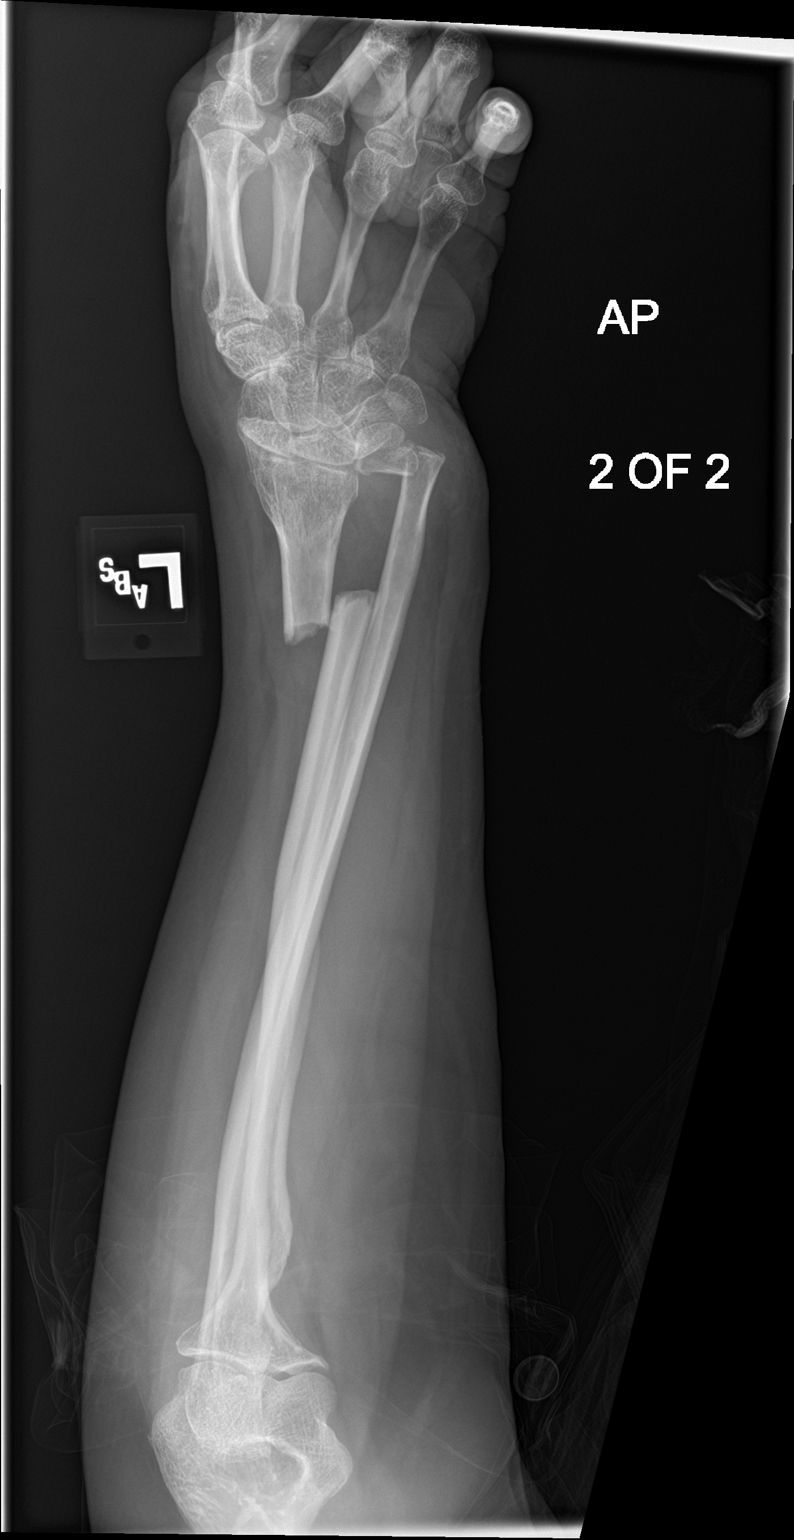

[3 of 3 positions shown; findings below may reference images not displayed]

FINDINGS: Acute transversely oriented fracture through the distal left radial
diaphysis. Moderate displacement with approximately 1 shaft width of
dorsal and radial displacement. Moderate anterior apex angulation.
There is also a moderately displaced fracture through the distal
ulnar metaphysis with dorsal-radial displacement. Distal radioulnar
joint appears disrupted. Radiocarpal osteoarthritis. No radiocarpal
dislocation. Diffuse soft tissue swelling at the fracture site.
IMPRESSION: 1. Acute moderately displaced and angulated fracture through the
distal left radial diaphysis.
2. Moderately displaced fracture through the distal ulnar
metaphysis.

## 2022-12-12 IMAGING — DX DG FOREARM 2V*L*
2 series · 2 of 2 positions shown · non-contrast
Comparison: Forearm radiographs 08/24/2020

CLINICAL DATA: Status post reduction and splinting distal radius
and ulnar fractures

EXAM:
LEFT FOREARM - 2 VIEW

[forearm ap]
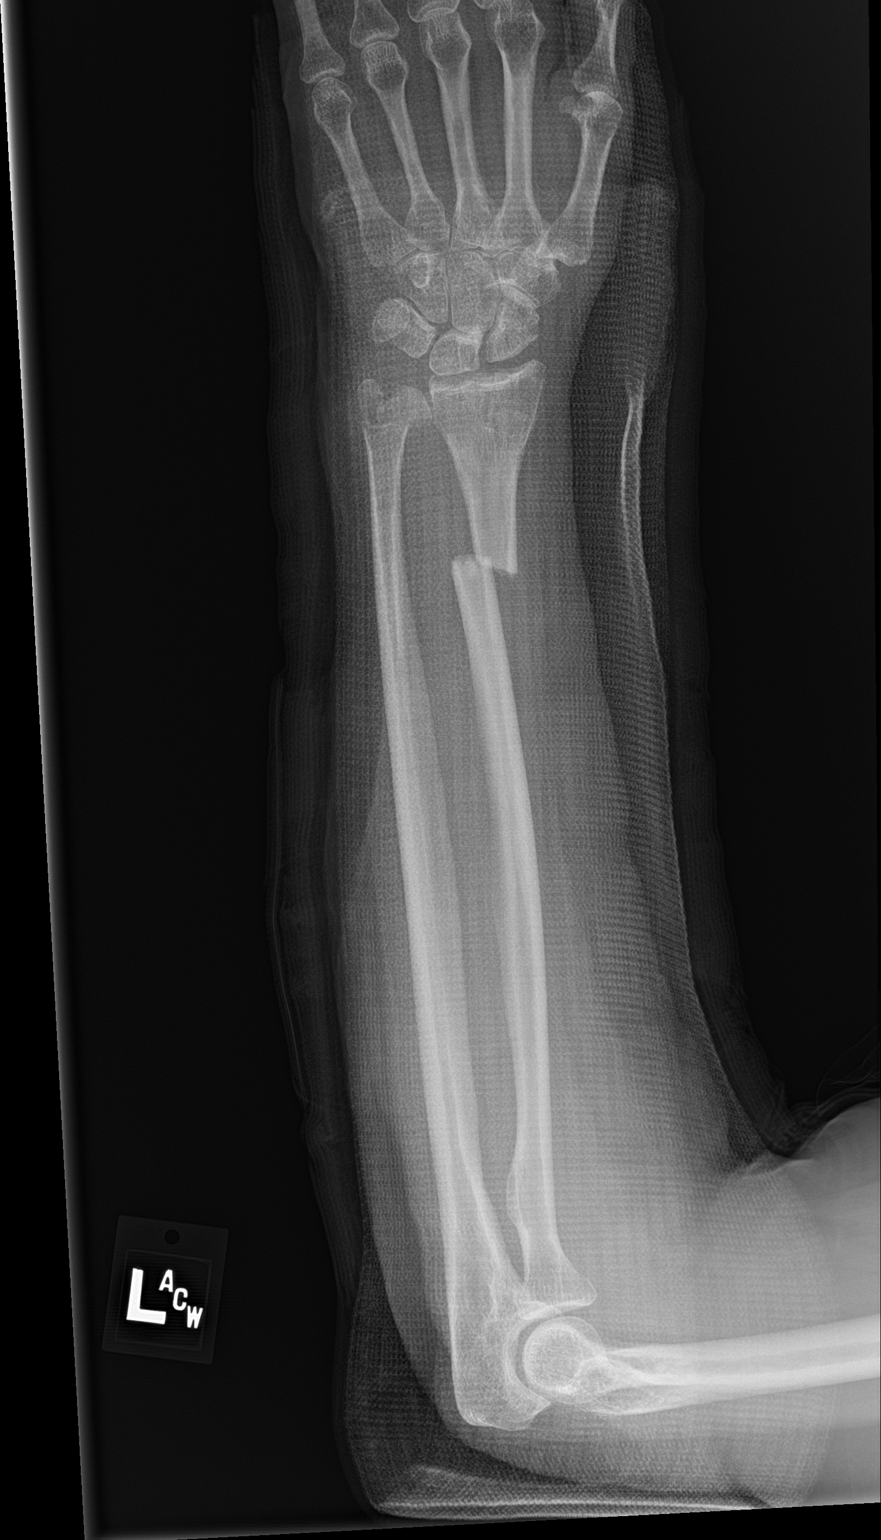

[forearm lat]
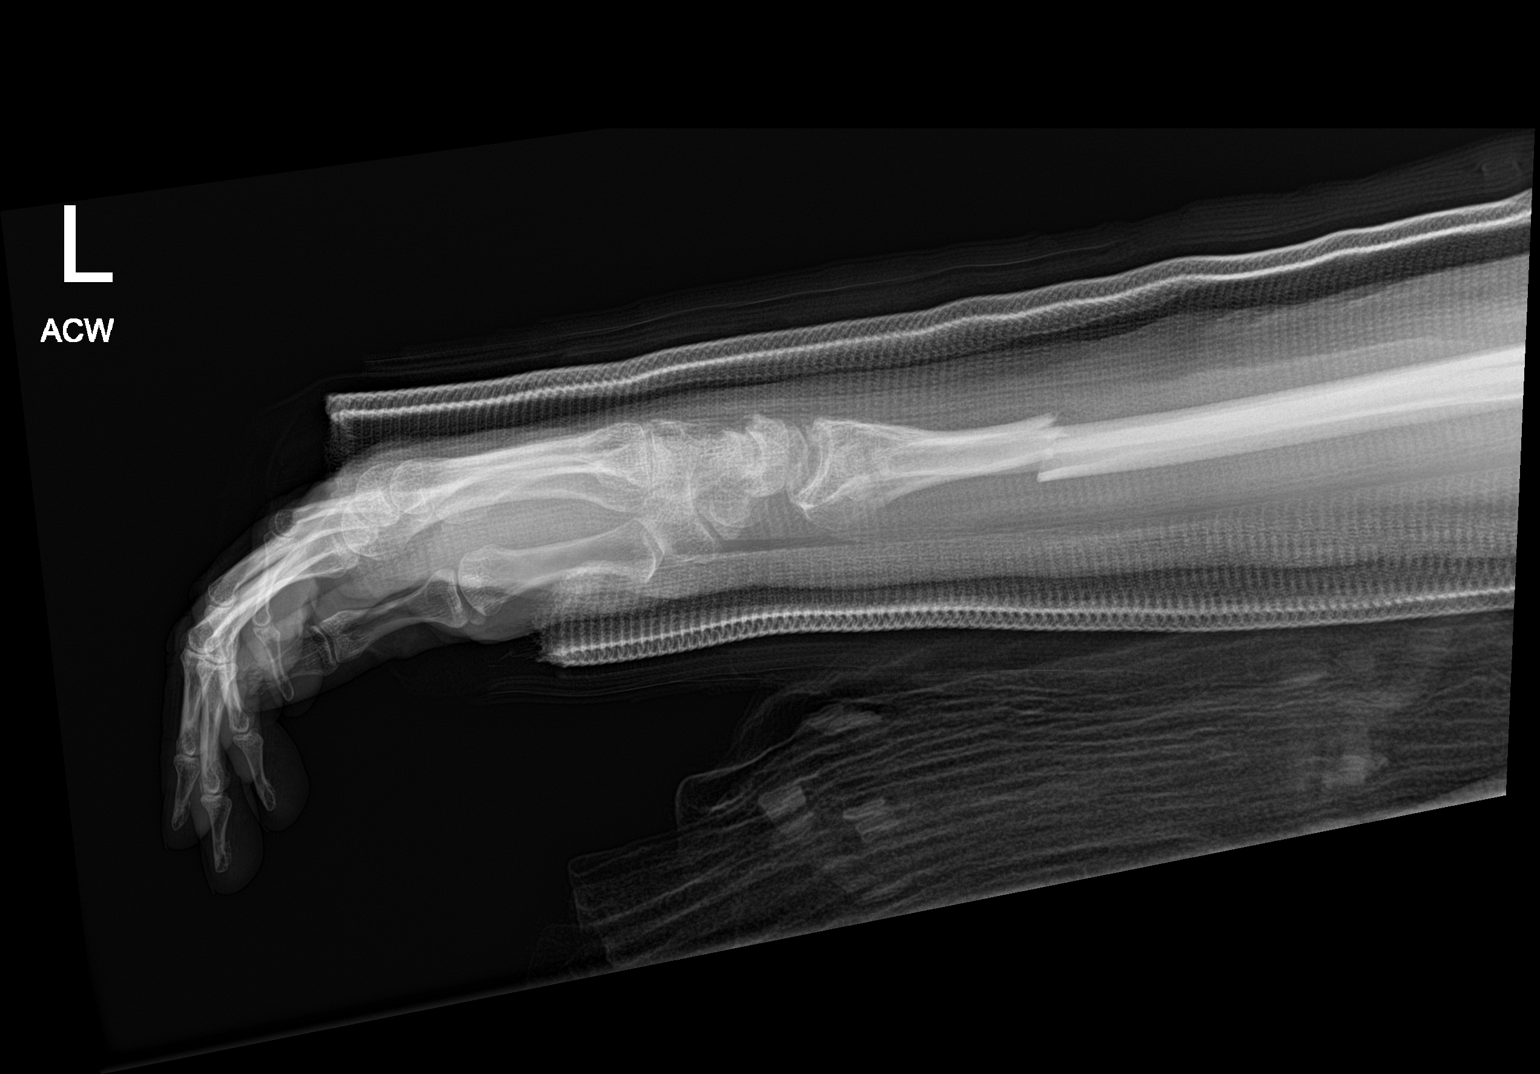

[2 of 2 positions shown; findings below may reference images not displayed]

FINDINGS: Significant interval improvement of osseous alignment. Mild lateral
and dorsal displacement of the radial fracture fragment still
remains. Interval placement of cast.
IMPRESSION: Interval improvement in alignment of the distal radius and ulna
fracture status post reduction.

## 2022-12-24 ENCOUNTER — Telehealth: Payer: Self-pay

## 2022-12-24 ENCOUNTER — Ambulatory Visit (INDEPENDENT_AMBULATORY_CARE_PROVIDER_SITE_OTHER): Payer: Medicare Other | Admitting: Internal Medicine

## 2022-12-24 ENCOUNTER — Encounter: Payer: Self-pay | Admitting: Internal Medicine

## 2022-12-24 VITALS — BP 128/79 | HR 60 | Ht 64.0 in | Wt 196.6 lb

## 2022-12-24 DIAGNOSIS — M3500 Sicca syndrome, unspecified: Secondary | ICD-10-CM | POA: Diagnosis not present

## 2022-12-24 DIAGNOSIS — Z0000 Encounter for general adult medical examination without abnormal findings: Secondary | ICD-10-CM

## 2022-12-24 DIAGNOSIS — I10 Essential (primary) hypertension: Secondary | ICD-10-CM | POA: Diagnosis not present

## 2022-12-24 DIAGNOSIS — K279 Peptic ulcer, site unspecified, unspecified as acute or chronic, without hemorrhage or perforation: Secondary | ICD-10-CM

## 2022-12-24 DIAGNOSIS — G43C0 Periodic headache syndromes in child or adult, not intractable: Secondary | ICD-10-CM | POA: Diagnosis not present

## 2022-12-24 DIAGNOSIS — E034 Atrophy of thyroid (acquired): Secondary | ICD-10-CM

## 2022-12-24 DIAGNOSIS — Z86010 Personal history of colonic polyps: Secondary | ICD-10-CM

## 2022-12-24 DIAGNOSIS — M8580 Other specified disorders of bone density and structure, unspecified site: Secondary | ICD-10-CM

## 2022-12-24 DIAGNOSIS — I77810 Thoracic aortic ectasia: Secondary | ICD-10-CM | POA: Diagnosis not present

## 2022-12-24 DIAGNOSIS — R7303 Prediabetes: Secondary | ICD-10-CM

## 2022-12-24 DIAGNOSIS — E782 Mixed hyperlipidemia: Secondary | ICD-10-CM | POA: Diagnosis not present

## 2022-12-24 LAB — POCT URINALYSIS DIPSTICK
Bilirubin, UA: NEGATIVE
Blood, UA: NEGATIVE
Glucose, UA: NEGATIVE
Ketones, UA: NEGATIVE
Leukocytes, UA: NEGATIVE
Nitrite, UA: NEGATIVE
Protein, UA: NEGATIVE
Spec Grav, UA: 1.015 (ref 1.010–1.025)
Urobilinogen, UA: 0.2 E.U./dL
pH, UA: 6.5 (ref 5.0–8.0)

## 2022-12-24 NOTE — Assessment & Plan Note (Addendum)
Reflux symptoms are minimal on current therapy - pantoprazole. No red flag signs such as weight loss, n/v, melena Last EGD 2021 - erosions

## 2022-12-24 NOTE — Patient Instructions (Signed)
Call [facility] to schedule your Bone Density at [phone].

## 2022-12-24 NOTE — Telephone Encounter (Signed)
Tried completing a PA on [email or web address] for butalbital-apap-caffeine-codeine  and it says medication has already been approved until 05/27/2023. Dr Judithann Graves informed.  - Ashley Glover

## 2022-12-24 NOTE — Assessment & Plan Note (Addendum)
Headaches are persistent, treated with Fioricet prn. Insurance needs PA for #60 which lasts her 3-4 months

## 2022-12-24 NOTE — Assessment & Plan Note (Signed)
Due for three year colonoscopy 02/2023 with Dr. Servando Snare

## 2022-12-24 NOTE — Assessment & Plan Note (Signed)
supplemented

## 2022-12-24 NOTE — Progress Notes (Signed)
Date:  12/24/2022   Name:  Ashley Glover   DOB:  Oct 08, 1960   MRN:  [id number]   Chief Complaint: Annual Exam Ashley Glover is a 62 y.o. female who presents today for her Complete Annual Exam. She feels fairly well. She reports exercising - none. She reports she is sleeping fairly well. Breast complaints - none.  She had a fall in March and fractured her wrist and tore ligaments in her ankle.  Mammogram: 06/2022 DEXA: 05/2020 osteopenia Pap smear: s/p TAH Colonoscopy: 02/2020 repeat 3 yrs  Health Maintenance Due  Topic Date Due   COVID-19 Vaccine (1) Never done   HIV Screening  Never done   Medicare Annual Wellness (AWV)  02/07/2023   Colonoscopy  03/07/2023    Immunization History  Administered Date(s) Administered   Influenza,inj,Quad PF,6+ Mos 04/24/2016, 03/04/2017, 03/11/2018   Influenza-Unspecified 03/28/2019   Tdap 09/19/2021   Zoster Recombinant(Shingrix) 08/03/2019, 11/05/2019    Hypertension This is a chronic problem. Associated symptoms include headaches. Pertinent negatives include no chest pain, palpitations or shortness of breath. Past treatments include beta blockers. Identifiable causes of hypertension include a thyroid problem.  Hyperlipidemia This is a chronic problem. The problem is controlled. Associated symptoms include myalgias. Pertinent negatives include no chest pain or shortness of breath. Current antihyperlipidemic treatment includes statins. The current treatment provides significant improvement of lipids.  Thyroid Problem Presents for follow-up visit. Patient reports no anxiety, constipation, diarrhea, fatigue, palpitations or tremors. The symptoms have been stable. Her past medical history is significant for hyperlipidemia.  Headache  This is a chronic problem. The problem occurs daily. The problem has been unchanged. Pertinent negatives include no abdominal pain, coughing, dizziness, fever, hearing loss, tinnitus or vomiting. Her past medical  history is significant for hypertension.    Lab Results  Component Value Date   NA 144 03/01/2022   K 4.3 09/12/2022   CO2 22 03/01/2022   GLUCOSE 106 (H) 03/01/2022   BUN 12 03/01/2022   CREATININE 0.76 03/01/2022   CALCIUM 11.2 (H) 03/01/2022   EGFR 89 03/01/2022   GFRNONAA >60 08/24/2020   Lab Results  Component Value Date   CHOL 186 09/19/2021   HDL 62 09/19/2021   LDLCALC 95 09/19/2021   TRIG 173 (H) 09/19/2021   CHOLHDL 3.0 09/19/2021   Lab Results  Component Value Date   TSH 3.910 03/01/2022   Lab Results  Component Value Date   HGBA1C 5.8 (H) 03/01/2022   Lab Results  Component Value Date   WBC 10.7 09/19/2021   HGB 14.2 09/19/2021   HCT 44.7 09/19/2021   MCV 88 09/19/2021   PLT 275 09/19/2021   Lab Results  Component Value Date   ALT 18 09/19/2021   AST 20 09/19/2021   ALKPHOS 112 09/19/2021   BILITOT 0.4 09/19/2021   Lab Results  Component Value Date   VD25OH 33.5 06/25/2019     Review of Systems  Constitutional:  Negative for chills, fatigue and fever.  HENT:  Negative for congestion, hearing loss, tinnitus, trouble swallowing and voice change.   Eyes:  Negative for visual disturbance.  Respiratory:  Negative for cough, chest tightness, shortness of breath and wheezing.   Cardiovascular:  Negative for chest pain, palpitations and leg swelling.  Gastrointestinal:  Negative for abdominal pain, constipation, diarrhea and vomiting.  Endocrine: Negative for polydipsia and polyuria.  Genitourinary:  Negative for dysuria, frequency, genital sores, vaginal bleeding and vaginal discharge.  Musculoskeletal:  Positive for arthralgias and  myalgias. Negative for gait problem and joint swelling.       Recent fall and wrist fracture  Skin:  Negative for color change and rash.  Neurological:  Positive for headaches. Negative for dizziness, tremors and light-headedness.  Hematological:  Negative for adenopathy. Does not bruise/bleed easily.   Psychiatric/Behavioral:  Negative for dysphoric mood and sleep disturbance. The patient is not nervous/anxious.     Patient Active Problem List   Diagnosis Date Noted   Prediabetes 03/01/2022   Aortic root dilatation (HCC) 01/16/2022   S/P total hysterectomy 03/16/2020   Dermatitis of both ear canals 03/01/2020   Pruritus 01/25/2020   Insomnia 06/25/2019   Osteopenia determined by x-ray 06/23/2018   Chronic otitis media 09/30/2017   Migraine without aura and without status migrainosus, not intractable 05/21/2017   Not currently working due to disabled status 01/24/2017   Degenerative arthritis 01/24/2017   Frequent PVCs 08/20/2016   Hx of colonic polyps 04/24/2016   Periodic headache syndrome, not intractable 01/31/2016   Rosacea, acne 01/31/2016   Essential hypertension 08/02/2015   Hypothyroidism due to acquired atrophy of thyroid 08/02/2015   Fibromyalgia 08/02/2015   Sjogren's syndrome (HCC) 08/02/2015   Peptic ulcer disease 08/02/2015   Primary osteoarthritis of both knees 08/02/2015   Other allergic rhinitis 08/02/2015   Hyperlipidemia, mixed 08/02/2015    Allergies  Allergen Reactions   Ace Inhibitors Cough   Ketamine     Pt did  not like the way it made her feel   Morphine And Codeine Nausea Only   Savella [Milnacipran Hcl] Palpitations    Past Surgical History:  Procedure Laterality Date   AUGMENTATION MAMMAPLASTY Bilateral 1987   COLONOSCOPY  05/2012   polyps   COLONOSCOPY WITH PROPOFOL N/A 06/20/2016   Procedure: COLONOSCOPY WITH PROPOFOL;  Surgeon: Midge Minium, MD;  [facility];  Service: Endoscopy;  Laterality: N/A;   COLONOSCOPY WITH PROPOFOL N/A 11/11/2019   Procedure: COLONOSCOPY WITH PROPOFOL;  Surgeon: Midge Minium, MD;  Location: [facility];  Service: Endoscopy;  Laterality: N/A;  3   COLONOSCOPY WITH PROPOFOL N/A 03/06/2020   Procedure: COLONOSCOPY WITH BIOPSY;  Surgeon: Midge Minium, MD;  Location: [facility];   Service: Endoscopy;  Laterality: N/A;   ESOPHAGOGASTRODUODENOSCOPY (EGD) WITH PROPOFOL N/A 06/20/2016   Procedure: ESOPHAGOGASTRODUODENOSCOPY (EGD) WITH PROPOFOL;  Surgeon: Midge Minium, MD;  Location: [facility];  Service: Endoscopy;  Laterality: N/A;   ESOPHAGOGASTRODUODENOSCOPY (EGD) WITH PROPOFOL N/A 11/11/2019   Procedure: ESOPHAGOGASTRODUODENOSCOPY (EGD) WITH PROPOFOL;  Surgeon: Midge Minium, MD;  Location: [facility];  Service: Endoscopy;  Laterality: N/A;   ESOPHAGOGASTRODUODENOSCOPY (EGD) WITH PROPOFOL N/A 03/06/2020   Procedure: ESOPHAGOGASTRODUODENOSCOPY (EGD) WITH BIOPSY;  Surgeon: Midge Minium, MD;  Location: [facility];  Service: Endoscopy;  Laterality: N/A;   FOOT SURGERY Right    GANGLION CYST EXCISION Left    NASAL SINUS SURGERY     NEUROMA SURGERY Left    OPEN REDUCTION INTERNAL FIXATION (ORIF) DISTAL RADIAL FRACTURE Left 08/24/2020   Procedure: OPEN REDUCTION INTERNAL FIXATION (ORIF) DISTAL RADIAL FRACTURE;  Surgeon: Christena Flake, MD;  Location: [facility];  Service: Orthopedics;  Laterality: Left;   OPEN REDUCTION INTERNAL FIXATION (ORIF) DISTAL RADIAL FRACTURE Right 09/13/2022   Procedure: OPEN REDUCTION INTERNAL FIXATION (ORIF) DISTAL RADIUS FRACTURE;  Surgeon: Kennedy Bucker, MD;  Location: [facility];  Service: Orthopedics;  Laterality: Right;   PLACEMENT OF BREAST IMPLANTS     POLYPECTOMY  06/20/2016   Procedure: POLYPECTOMY;  Surgeon:  Midge Minium, MD;  Location: [facility];  Service: Endoscopy;;   POLYPECTOMY N/A 03/06/2020   Procedure: POLYPECTOMY;  Surgeon: Midge Minium, MD;  Location: [facility];  Service: Endoscopy;  Laterality: N/A;   REPLACEMENT TOTAL KNEE Right 06/2014   TONSILLECTOMY     TOTAL VAGINAL HYSTERECTOMY  1996   cervical dysplasia    Social History   Tobacco Use   Smoking status: Never   Smokeless tobacco: Never  Vaping Use   Vaping status: Never Used  Substance Use Topics   Alcohol use:  No    Alcohol/week: 0.0 standard drinks of alcohol   Drug use: No     Medication list has been reviewed and updated.  Current Meds  Medication Sig   butalbital-apap-caffeine-codeine (FIORICET WITH CODEINE) 50-325-40-30 MG capsule TAKE 1 TO 2 CAPSULES BY MOUTH EVERY 6 HOURS AS NEEDED FOR  HEADACHE  OR  MIGRAINE   DULoxetine (CYMBALTA) 60 MG capsule TAKE 1 CAPSULE BY MOUTH DAILY   furosemide (LASIX) 40 MG tablet TAKE 1 TABLET BY MOUTH DAILY   gabapentin (NEURONTIN) 600 MG tablet TAKE 1 TABLET BY MOUTH 3 TIMES  DAILY   hydrochlorothiazide (HYDRODIURIL) 12.5 MG tablet Take 12.5 mg by mouth daily.   levothyroxine (SYNTHROID) 88 MCG tablet TAKE 1 TABLET BY MOUTH DAILY  BEFORE BREAKFAST   mometasone (ELOCON) 0.1 % lotion Apply topically daily. To both ears as needed   Multiple Vitamin (MULTIVITAMIN ADULT PO) Take 1 tablet by mouth daily.   pantoprazole (PROTONIX) 40 MG tablet TAKE 1 TABLET BY MOUTH TWICE  DAILY   promethazine (PHENERGAN) 25 MG tablet Take 1 tablet (25 mg total) by mouth every 8 (eight) hours as needed for nausea or vomiting.   propranolol (INDERAL) 40 MG tablet TAKE 1 TABLET BY MOUTH TWICE  DAILY   rosuvastatin (CRESTOR) 20 MG tablet Take 20 mg by mouth at bedtime.   tiZANidine (ZANAFLEX) 4 MG tablet TAKE 1 TABLET BY MOUTH 3 TIMES  DAILY   tobramycin-dexamethasone (TOBRADEX) ophthalmic solution Place 1 drop into both eyes every 6 (six) hours.   [DISCONTINUED] calcium-vitamin D (OSCAL WITH D) 250-125 MG-UNIT tablet Take 1 tablet by mouth daily.   [DISCONTINUED] oxyCODONE-acetaminophen (PERCOCET) 5-325 MG tablet Take 1 tablet by mouth every 4 (four) hours as needed for severe pain.   [DISCONTINUED] oxyCODONE-acetaminophen (PERCOCET) 5-325 MG tablet Take 1 tablet by mouth every 4 (four) hours as needed for severe pain.       12/24/2022    9:39 AM 03/01/2022    9:51 AM 09/19/2021    9:29 AM 12/13/2020    9:04 AM  GAD 7 : Generalized Anxiety Score  Nervous, Anxious, on Edge 0 0 0  0  Control/stop worrying 0 0 0 0  Worry too much - different things 0 0 0 0  Trouble relaxing 0 0 0 0  Restless 0 0 0 0  Easily annoyed or irritable 0 0 0 0  Afraid - awful might happen 0 0 0 0  Total GAD 7 Score 0 0 0 0  Anxiety Difficulty Not difficult at all Not difficult at all Not difficult at all Not difficult at all       12/24/2022    9:39 AM 03/01/2022    9:51 AM 02/06/2022    3:07 PM  Depression screen PHQ 2/9  Decreased Interest 0 0 0  Down, Depressed, Hopeless 0 0 0  PHQ - 2 Score 0 0 0  Altered sleeping 0 0 1  Tired,  decreased energy 0 0 1  Change in appetite 0 0 0  Feeling bad or failure about yourself  0 0 0  Trouble concentrating 0 0 0  Moving slowly or fidgety/restless 0 0 0  Suicidal thoughts 0 0 0  PHQ-9 Score 0 0 2  Difficult doing work/chores Not difficult at all Not difficult at all Not difficult at all    BP Readings from Last 3 Encounters:  12/24/22 128/79  09/13/22 (!) 155/102  09/03/22 (!) 147/110    Physical Exam Vitals and nursing note reviewed.  Constitutional:      General: She is not in acute distress.    Appearance: Normal appearance. She is well-developed.  HENT:     Head: Normocephalic and atraumatic.     Right Ear: Tympanic membrane and ear canal normal.     Left Ear: Tympanic membrane and ear canal normal.     Nose:     Right Sinus: No maxillary sinus tenderness.     Left Sinus: No maxillary sinus tenderness.  Eyes:     General: No scleral icterus.       Right eye: No discharge.        Left eye: No discharge.     Conjunctiva/sclera: Conjunctivae normal.  Neck:     Thyroid: No thyromegaly.     Vascular: No carotid bruit.  Cardiovascular:     Rate and Rhythm: Normal rate and regular rhythm.     Pulses: Normal pulses.     Heart sounds: Normal heart sounds.  Pulmonary:     Effort: Pulmonary effort is normal. No respiratory distress.     Breath sounds: No wheezing.  Chest:  Breasts:    Right: No mass, nipple discharge, skin  change or tenderness.     Left: No mass, nipple discharge, skin change or tenderness.     Comments: Fibrocystic changes on left Abdominal:     General: Bowel sounds are normal.     Palpations: Abdomen is soft.     Tenderness: There is no abdominal tenderness.  Musculoskeletal:     Cervical back: Normal range of motion. No erythema.     Right lower leg: No edema.     Left lower leg: No edema.  Lymphadenopathy:     Cervical: No cervical adenopathy.  Skin:    General: Skin is warm and dry.     Findings: No rash.  Neurological:     Mental Status: She is alert and oriented to person, place, and time.     Cranial Nerves: No cranial nerve deficit.     Sensory: No sensory deficit.     Deep Tendon Reflexes: Reflexes are normal and symmetric.  Psychiatric:        Attention and Perception: Attention normal.        Mood and Affect: Mood normal.     Wt Readings from Last 3 Encounters:  12/24/22 196 lb 9.6 oz (89.2 kg)  09/13/22 193 lb 8 oz (87.8 kg)  09/03/22 190 lb 0.6 oz (86.2 kg)    BP 128/79 (BP Location: Left Arm, Cuff Size: Large)   Pulse 60   Ht 5\' 4"  (1.626 m)   Wt 196 lb 9.6 oz (89.2 kg)   SpO2 (!) 61%   BMI 33.75 kg/m   Assessment and Plan:  Problem List Items Addressed This Visit       Unprioritized   Sjogren's syndrome (HCC) (Chronic)   Prediabetes (Chronic)   Relevant Orders   Hemoglobin A1c  Periodic headache syndrome, not intractable (Chronic)    Headaches are persistent, treated with Fioricet prn. Insurance needs PA for #60 which lasts her 3-4 months      Peptic ulcer disease (Chronic)    Reflux symptoms are minimal on current therapy - pantoprazole. No red flag signs such as weight loss, n/v, melena Last EGD 2021 - erosions       Relevant Orders   CBC with Differential/Platelet   Osteopenia determined by x-ray    Recent fall with fracture; treated in the past with [facility] Needs repeat DEXA Consider referral for Prolia ( would not recommend  Fosamax due to hx of gastritis)      Relevant Orders   VITAMIN D 25 Hydroxy (Vit-D Deficiency, Fractures)   DG Bone Density   Hypothyroidism due to acquired atrophy of thyroid (Chronic)    supplemented      Relevant Orders   TSH + free T4   Hyperlipidemia, mixed (Chronic)    LDL is  Lab Results  Component Value Date   LDLCALC 95 09/19/2021   Currently being treated with crestor with good compliance and no concerns.       Relevant Orders   Lipid panel   Hx of colonic polyps    Due for three year colonoscopy 02/2023 with Dr. Servando Snare      Relevant Orders   Ambulatory referral to Gastroenterology   Essential hypertension (Chronic)    Normal exam with stable BP on propranolol and lasix. No concerns or side effects to current medication. No change in regimen; continue low sodium diet.       Relevant Orders   Comprehensive metabolic panel   POCT urinalysis dipstick   Aortic root dilatation (HCC)   Other Visit Diagnoses     Annual physical exam    -  Primary   up to date on screenings and immunizations continue work on healthy diet, activity as tolerated       Return in about 6 months (around 06/26/2023) for HTN.    Reubin Milan, MD [facility] [place]

## 2022-12-24 NOTE — Assessment & Plan Note (Signed)
LDL is  Lab Results  Component Value Date   LDLCALC 95 09/19/2021   Currently being treated with crestor with good compliance and no concerns.

## 2022-12-24 NOTE — Assessment & Plan Note (Signed)
Normal exam with stable BP on propranolol and lasix. No concerns or side effects to current medication. No change in regimen; continue low sodium diet.

## 2022-12-24 NOTE — Assessment & Plan Note (Addendum)
Recent fall with fracture; treated in the past with [facility] Needs repeat DEXA Consider referral for Prolia ( would not recommend Fosamax due to hx of gastritis)

## 2022-12-25 ENCOUNTER — Encounter: Payer: Self-pay | Admitting: Internal Medicine

## 2023-01-07 ENCOUNTER — Other Ambulatory Visit: Payer: Self-pay | Admitting: Internal Medicine

## 2023-01-07 DIAGNOSIS — G43009 Migraine without aura, not intractable, without status migrainosus: Secondary | ICD-10-CM

## 2023-01-13 ENCOUNTER — Other Ambulatory Visit: Payer: Self-pay | Admitting: Internal Medicine

## 2023-01-14 NOTE — Telephone Encounter (Signed)
Change to #100 for best insurance coverage Requested Prescriptions  Pending Prescriptions Disp Refills   pantoprazole (PROTONIX) 40 MG tablet [Pharmacy Med Name: Pantoprazole Sodium 40 MG Oral Tablet Delayed Release] 200 tablet 3    Sig: TAKE 1 TABLET BY MOUTH TWICE  DAILY     Gastroenterology: Proton Pump Inhibitors Passed - 01/13/2023  4:25 AM      Passed - Valid encounter within last 12 months    Recent Outpatient Visits           3 weeks ago Annual physical exam   [place] [facility], Nyoka Cowden, MD   10 months ago Essential hypertension   [place] [facility] at [facility], Nyoka Cowden, MD   1 year ago Annual physical exam   [facility] at [facility], Nyoka Cowden, MD   2 years ago Essential hypertension   [place] [facility] at [facility], Nyoka Cowden, MD   2 years ago Annual physical exam   [place] [facility] at [facility], Nyoka Cowden, MD       Future Appointments             In 4 months Judithann Graves Nyoka Cowden, MD [facility] at [facility], [facility]             DULoxetine (CYMBALTA) 60 MG capsule [Pharmacy Med Name: DULoxetine HCl 60 MG Oral Capsule Delayed Release Particles] 100 capsule 3    Sig: TAKE 1 CAPSULE BY MOUTH DAILY     Psychiatry: Antidepressants - SNRI - duloxetine Passed - 01/13/2023  4:25 AM      Passed - Cr in normal range and within 360 days    Creatinine, Ser  Date Value Ref Range Status  12/24/2022 0.78 0.57 - 1.00 mg/dL Final         Passed - eGFR is 30 or above and within 360 days    GFR calc Af Amer  Date Value Ref Range Status  07/10/2020 76 >59 mL/min/1.73 Final    Comment:    **In accordance with recommendations from the NKF-ASN Task force,**   [facility] is in the process of updating its eGFR calculation to the   2021  CKD-EPI creatinine equation that estimates kidney function   without a race variable.    GFR, Estimated  Date Value Ref Range Status  08/24/2020 >60 >60 mL/min Final    Comment:    (NOTE) Calculated using the CKD-EPI Creatinine Equation (2021)    eGFR  Date Value Ref Range Status  12/24/2022 85 >59 mL/min/1.73 Final         Passed - Completed PHQ-2 or PHQ-9 in the last 360 days      Passed - Last BP in normal range    BP Readings from Last 1 Encounters:  12/24/22 128/79         Passed - Valid encounter within last 6 months    Recent Outpatient Visits           3 weeks ago Annual physical exam   [facility] at [facility], Nyoka Cowden, MD   10 months ago Essential hypertension   [place] [facility] at [facility], Nyoka Cowden, MD   1 year ago Annual physical exam   [facility] at [facility], Nyoka Cowden, MD   2 years ago Essential hypertension   [place] [facility] at [facility], Nyoka Cowden, MD   2 years ago Annual physical exam   [facility] at [facility], Nyoka Cowden, MD       Future Appointments             In 4 months Judithann Graves, Nyoka Cowden, MD [facility] Primary Care & Sports Medicine at [facility], [facility]

## 2023-01-16 DIAGNOSIS — I77810 Thoracic aortic ectasia: Secondary | ICD-10-CM | POA: Diagnosis not present

## 2023-01-16 DIAGNOSIS — I10 Essential (primary) hypertension: Secondary | ICD-10-CM | POA: Diagnosis not present

## 2023-01-20 ENCOUNTER — Other Ambulatory Visit: Payer: Self-pay | Admitting: Internal Medicine

## 2023-01-20 DIAGNOSIS — J3089 Other allergic rhinitis: Secondary | ICD-10-CM

## 2023-01-20 DIAGNOSIS — E034 Atrophy of thyroid (acquired): Secondary | ICD-10-CM

## 2023-01-21 NOTE — Telephone Encounter (Signed)
Requested Prescriptions  Refused Prescriptions Disp Refills   montelukast (SINGULAIR) 10 MG tablet [Pharmacy Med Name: Montelukast Sodium 10 MG Oral Tablet] 100 tablet 2    Sig: TAKE 1 TABLET BY MOUTH AT  BEDTIME     Pulmonology:  Leukotriene Inhibitors Passed - 01/20/2023  4:23 AM      Passed - Valid encounter within last 12 months    Recent Outpatient Visits           4 weeks ago Annual physical exam   [place] [facility] at [facility], Nyoka Cowden, MD   10 months ago Essential hypertension   [place] [facility] at [facility], Nyoka Cowden, MD   1 year ago Annual physical exam   [facility] at [facility], Nyoka Cowden, MD   2 years ago Essential hypertension   [place] [facility] at [facility], Nyoka Cowden, MD   2 years ago Annual physical exam   [facility] & Sports Medicine at [facility], Nyoka Cowden, MD       Future Appointments             In 4 months Judithann Graves Nyoka Cowden, MD [facility] Primary Care & [facility] Medicine at [facility], [facility]             levothyroxine (SYNTHROID) 88 MCG tablet [Pharmacy Med Name: Levothyroxine Sodium 88 MCG Oral Tablet] 100 tablet 2    Sig: TAKE 1 TABLET BY MOUTH DAILY  BEFORE BREAKFAST     Endocrinology:  Hypothyroid Agents Passed - 01/20/2023  4:23 AM      Passed - TSH in normal range and within 360 days    TSH  Date Value Ref Range Status  12/24/2022 3.380 0.450 - 4.500 uIU/mL Final         Passed - Valid encounter within last 12 months    Recent Outpatient Visits           4 weeks ago Annual physical exam   [facility] at [facility], Nyoka Cowden, MD   10 months ago Essential hypertension   [place] [facility] at [facility], Nyoka Cowden, MD   1 year ago Annual physical exam   [facility] at [facility], Nyoka Cowden, MD   2 years ago Essential hypertension   [place] [facility] at [facility], Nyoka Cowden, MD   2 years ago Annual physical exam   [facility] & [facility] Medicine at [facility], Nyoka Cowden, MD       Future Appointments             In 4 months Judithann Graves, Nyoka Cowden, MD [facility] Primary Care & Sports Medicine at [facility], [facility]

## 2023-01-28 ENCOUNTER — Telehealth: Payer: Self-pay | Admitting: Internal Medicine

## 2023-01-28 NOTE — Telephone Encounter (Signed)
Copied from CRM [id number]. Topic: General - Other >> Jan 28, 2023  2:17 PM Dominique A wrote: Reason for CRM: Pt is calling to see if her last two sets of labs can be mailed to her. Pt states that she has no computer and no way on logging on to check her results. Please advise.  Pt address: [address] [place] [id number]

## 2023-01-28 NOTE — Telephone Encounter (Signed)
Mailed labs to patient.

## 2023-01-29 ENCOUNTER — Ambulatory Visit
Admission: RE | Admit: 2023-01-29 | Discharge: 2023-01-29 | Disposition: A | Discharge status: Home / Self Care | Payer: Medicare Other | Source: Ambulatory Visit | Attending: Internal Medicine | Admitting: Internal Medicine

## 2023-01-29 DIAGNOSIS — Z780 Asymptomatic menopausal state: Secondary | ICD-10-CM | POA: Insufficient documentation

## 2023-01-29 DIAGNOSIS — M8580 Other specified disorders of bone density and structure, unspecified site: Secondary | ICD-10-CM | POA: Insufficient documentation

## 2023-01-29 DIAGNOSIS — Z8262 Family history of osteoporosis: Secondary | ICD-10-CM | POA: Diagnosis not present

## 2023-01-29 DIAGNOSIS — Z13820 Encounter for screening for osteoporosis: Secondary | ICD-10-CM | POA: Insufficient documentation

## 2023-01-29 DIAGNOSIS — M85851 Other specified disorders of bone density and structure, right thigh: Secondary | ICD-10-CM | POA: Insufficient documentation

## 2023-02-09 ENCOUNTER — Other Ambulatory Visit: Payer: Self-pay | Admitting: Internal Medicine

## 2023-02-09 DIAGNOSIS — M797 Fibromyalgia: Secondary | ICD-10-CM

## 2023-02-10 ENCOUNTER — Telehealth: Payer: Self-pay | Admitting: Gastroenterology

## 2023-02-10 NOTE — Telephone Encounter (Signed)
Patient called in wanting to schedule her EGD and colonoscopy.

## 2023-02-11 ENCOUNTER — Telehealth: Payer: Self-pay | Admitting: Internal Medicine

## 2023-02-11 NOTE — Telephone Encounter (Signed)
Patient is due for repeat colonoscopy. Last colonoscopy and EGD was 03/06/2020.  Patient is requesting a repeat EGD as well. Okay to schedule both?

## 2023-02-11 NOTE — Telephone Encounter (Signed)
Spoken to patient and she stated that she could not get a hold of her neighbor so she will call me Wednesday to let me know which days will work for her.

## 2023-02-11 NOTE — Telephone Encounter (Signed)
Chelsea from [facility] called stated they have changed manufacturers for the levothyroxine (SYNTHROID) 88 MCG tablet and need apprvl to fill the script. ZOX#[id number]. Please f/u with pharmacy

## 2023-02-12 ENCOUNTER — Other Ambulatory Visit: Payer: Self-pay | Admitting: Internal Medicine

## 2023-02-12 ENCOUNTER — Telehealth: Payer: Self-pay | Admitting: *Deleted

## 2023-02-12 ENCOUNTER — Other Ambulatory Visit: Payer: Self-pay | Admitting: *Deleted

## 2023-02-12 DIAGNOSIS — Z8719 Personal history of other diseases of the digestive system: Secondary | ICD-10-CM

## 2023-02-12 DIAGNOSIS — E034 Atrophy of thyroid (acquired): Secondary | ICD-10-CM

## 2023-02-12 DIAGNOSIS — Z86010 Personal history of colonic polyps: Secondary | ICD-10-CM

## 2023-02-12 NOTE — Telephone Encounter (Signed)
Printed and mailed.  KP

## 2023-02-12 NOTE — Telephone Encounter (Signed)
Called gave the ok for the medication to be filled.  KP

## 2023-02-12 NOTE — Telephone Encounter (Signed)
Medication Refill - Medication: levothyroxine (SYNTHROID) 88 MCG tablet   Has the patient contacted their pharmacy? Yes.   (Agent: If no, request that the patient contact the pharmacy for the refill. If patient does not wish to contact the pharmacy document the reason why and proceed with request.) (Agent: If yes, when and what did the pharmacy advise?)  Preferred Pharmacy (with phone number or street name):  [facility] [place], [place] - [address] [employer] Phone: [phone]  Fax: [phone]     Has the patient been seen for an appointment in the last year OR does the patient have an upcoming appointment? Yes.    Agent: Please be advised that RX refills may take up to 3 business days. We ask that you follow-up with your pharmacy.

## 2023-02-12 NOTE — Telephone Encounter (Signed)
Patient called back and we are scheduling her EGD and colonoscopy on 04/07/2023

## 2023-02-12 NOTE — Telephone Encounter (Signed)
Patient was given a sample of Sutab due to how she could tolerate Suprep or Golytely on the last colonoscopy. She was given some the last time.

## 2023-02-12 NOTE — Telephone Encounter (Signed)
Patient said that she never received her lab results in the mail and asked if they could be sent again.

## 2023-02-12 NOTE — Telephone Encounter (Signed)
Gastroenterology Pre-Procedure Review  Request Date: 04/07/2023 Requesting Physician: Dr. Servando Snare  PATIENT REVIEW QUESTIONS: The patient responded to the following health history questions as indicated:    1. Are you having any GI issues? no 2. Do you have a personal history of Polyps? yes (03/06/2020) 3. Do you have a family history of Colon Cancer or Polyps? no 4. Diabetes Mellitus? no 5. Joint replacements in the past 12 months?no 6. Major health problems in the past 3 months?no 7. Any artificial heart valves, MVP, or defibrillator?no    MEDICATIONS & ALLERGIES:    Patient reports the following regarding taking any anticoagulation/antiplatelet therapy:   Plavix, Coumadin, Eliquis, Xarelto, Lovenox, Pradaxa, Brilinta, or Effient? no Aspirin? no  Patient confirms/reports the following medications:  Current Outpatient Medications  Medication Sig Dispense Refill   butalbital-apap-caffeine-codeine (FIORICET WITH CODEINE) 50-325-40-30 MG capsule TAKE 1 TO 2 CAPSULES BY MOUTH EVERY 6 HOURS AS NEEDED FOR  HEADACHE  OR  MIGRAINE 60 capsule 2   DULoxetine (CYMBALTA) 60 MG capsule TAKE 1 CAPSULE BY MOUTH DAILY 100 capsule 1   furosemide (LASIX) 40 MG tablet TAKE 1 TABLET BY MOUTH DAILY 100 tablet 0   gabapentin (NEURONTIN) 600 MG tablet TAKE 1 TABLET BY MOUTH 3 TIMES  DAILY 300 tablet 2   hydrochlorothiazide (HYDRODIURIL) 12.5 MG tablet Take 12.5 mg by mouth daily.     hydrocortisone 2.5 % cream APPLY TOPICALLY TO AFFECTED  AREA(S) TOPICALLY TWICE DAILY 90 g 0   levothyroxine (SYNTHROID) 88 MCG tablet TAKE 1 TABLET BY MOUTH DAILY  BEFORE BREAKFAST 90 tablet 2   mometasone (ELOCON) 0.1 % lotion Apply topically daily. To both ears as needed 60 mL 1   Multiple Vitamin (MULTIVITAMIN ADULT PO) Take 1 tablet by mouth daily.     pantoprazole (PROTONIX) 40 MG tablet TAKE 1 TABLET BY MOUTH TWICE  DAILY 200 tablet 3   promethazine (PHENERGAN) 25 MG tablet TAKE 1 TABLET BY MOUTH EVERY 8 HOURS AS NEEDED  FOR NAUSEA AND FOR VOMITING 60 tablet 0   propranolol (INDERAL) 40 MG tablet TAKE 1 TABLET BY MOUTH TWICE  DAILY 200 tablet 2   rosuvastatin (CRESTOR) 20 MG tablet Take 20 mg by mouth at bedtime.     tiZANidine (ZANAFLEX) 4 MG tablet TAKE 1 TABLET BY MOUTH 3 TIMES  DAILY 300 tablet 1   tobramycin-dexamethasone (TOBRADEX) ophthalmic solution Place 1 drop into both eyes every 6 (six) hours. 5 mL 5   No current facility-administered medications for this visit.    Patient confirms/reports the following allergies:  Allergies  Allergen Reactions   Ace Inhibitors Cough   Ketamine     Pt did  not like the way it made her feel   Morphine And Codeine Nausea Only   Savella [Milnacipran Hcl] Palpitations    No orders of the defined types were placed in this encounter.   AUTHORIZATION INFORMATION Primary Insurance: 1D#: Group #:  Secondary Insurance: 1D#: Group #:  SCHEDULE INFORMATION: Date: 04/07/2023 Time: Location: [facility]

## 2023-02-13 MED ORDER — LEVOTHYROXINE SODIUM 88 MCG PO TABS
88.0000 ug | ORAL_TABLET | Freq: Every day | ORAL | 1 refills | Status: DC
Start: 2023-02-13 — End: 2023-07-14

## 2023-02-13 NOTE — Telephone Encounter (Signed)
Requested Prescriptions  Pending Prescriptions Disp Refills   levothyroxine (SYNTHROID) 88 MCG tablet 90 tablet 1    Sig: Take 1 tablet (88 mcg total) by mouth daily before breakfast.     Endocrinology:  Hypothyroid Agents Passed - 02/12/2023 10:14 AM      Passed - TSH in normal range and within 360 days    TSH  Date Value Ref Range Status  12/24/2022 3.380 0.450 - 4.500 uIU/mL Final         Passed - Valid encounter within last 12 months    Recent Outpatient Visits           1 month ago Annual physical exam   [facility] at [facility], Nyoka Cowden, MD   11 months ago Essential hypertension   [place] [facility] at [facility], Nyoka Cowden, MD   1 year ago Annual physical exam   [facility] at [facility], Nyoka Cowden, MD   2 years ago Essential hypertension    Primary Care & Sports Medicine at [facility], Nyoka Cowden, MD   2 years ago Annual physical exam   [facility] Primary Care & Sports Medicine at [facility], Nyoka Cowden, MD       Future Appointments             In 3 months Judithann Graves, Nyoka Cowden, MD [facility] Primary Care & Sports Medicine at [facility], [facility]

## 2023-02-17 DIAGNOSIS — M25572 Pain in left ankle and joints of left foot: Secondary | ICD-10-CM | POA: Diagnosis not present

## 2023-02-17 DIAGNOSIS — M7051 Other bursitis of knee, right knee: Secondary | ICD-10-CM | POA: Diagnosis not present

## 2023-02-17 DIAGNOSIS — M1712 Unilateral primary osteoarthritis, left knee: Secondary | ICD-10-CM | POA: Diagnosis not present

## 2023-02-17 DIAGNOSIS — M25571 Pain in right ankle and joints of right foot: Secondary | ICD-10-CM | POA: Diagnosis not present

## 2023-03-31 ENCOUNTER — Ambulatory Visit: Payer: Self-pay | Admitting: *Deleted

## 2023-03-31 NOTE — Telephone Encounter (Signed)
Reason for Disposition . [1] MODERATE back pain (e.g., interferes with normal activities) AND [2] present > 3 days  Answer Assessment - Initial Assessment Questions 1. ONSET: "When did the pain begin?"      Worsening symptoms 6 days ago- heat and rest not helping 2. LOCATION: "Where does it hurt?" (upper, mid or lower back)     Worst of pain whole mid section pf back- hard to bend- between shoulder blades to bottom of spine 3. SEVERITY: "How bad is the pain?"  (e.g., Scale 1-10; mild, moderate, or severe)   - MILD (1-3): Doesn't interfere with normal activities.    - MODERATE (4-7): Interferes with normal activities or awakens from sleep.    - SEVERE (8-10): Excruciating pain, unable to do any normal activities.      7/10 4. PATTERN: "Is the pain constant?" (e.g., yes, no; constant, intermittent)      Chronic- when still not as bad- if moves- worse 5. RADIATION: "Does the pain shoot into your legs or somewhere else?"     No- walking makes it worse- vibration- jarring 6. CAUSE:  "What do you think is causing the back pain?"      Degenerative disease and spinal stenosis - this is a bad flare 7. BACK OVERUSE:  "Any recent lifting of heavy objects, strenuous work or exercise?"     Not sure- patient is not at home- she has been visiting her niece  10. MEDICINES: "What have you taken so far for the pain?" (e.g., nothing, acetaminophen, NSAIDS)     tiZANidine (ZANAFLEX) 4 MG tablet  10. OTHER SYMPTOMS: "Do you have any other symptoms?" (e.g., fever, abdomen pain, burning with urination, blood in urine)       Headache, neck pain  Protocols used: Back Pain-A-AH

## 2023-03-31 NOTE — Telephone Encounter (Signed)
  Chief Complaint: back pain- flare Symptoms: patient states she has chronic back pain- but her flare has lasted longer than noraml and her prescribed medications are not helping Frequency: 6 days Pertinent Negatives: Patient denies neurologic disorders Disposition: [] ED /[] Urgent Care (no appt availability in office) / [x] Appointment(In office/virtual)/ []  [facility]/ [] Home Care/ [] Refused Recommended Disposition /[] [facility]/ []  Follow-up with PCP Additional Notes: Patient is in [place] presently and is requesting appointment tomorrow with provider- appointment has been scheduled

## 2023-04-01 ENCOUNTER — Telehealth: Payer: Self-pay

## 2023-04-01 ENCOUNTER — Encounter: Payer: Self-pay | Admitting: Internal Medicine

## 2023-04-01 ENCOUNTER — Ambulatory Visit (INDEPENDENT_AMBULATORY_CARE_PROVIDER_SITE_OTHER): Payer: Medicare Other | Admitting: Internal Medicine

## 2023-04-01 ENCOUNTER — Encounter: Payer: Self-pay | Admitting: Gastroenterology

## 2023-04-01 ENCOUNTER — Ambulatory Visit
Admission: RE | Admit: 2023-04-01 | Discharge: 2023-04-01 | Disposition: A | Discharge status: Home / Self Care | Payer: Medicare Other | Attending: Internal Medicine | Admitting: Internal Medicine

## 2023-04-01 ENCOUNTER — Ambulatory Visit
Admission: RE | Admit: 2023-04-01 | Discharge: 2023-04-01 | Disposition: A | Discharge status: Home / Self Care | Payer: Medicare Other | Source: Ambulatory Visit | Attending: Internal Medicine | Admitting: Internal Medicine

## 2023-04-01 ENCOUNTER — Ambulatory Visit: Payer: Medicare Other | Admitting: Anesthesiology

## 2023-04-01 VITALS — BP 128/79 | HR 61 | Ht 64.0 in | Wt 192.0 lb

## 2023-04-01 DIAGNOSIS — M546 Pain in thoracic spine: Secondary | ICD-10-CM | POA: Insufficient documentation

## 2023-04-01 DIAGNOSIS — D485 Neoplasm of uncertain behavior of skin: Secondary | ICD-10-CM

## 2023-04-01 MED ORDER — CYCLOBENZAPRINE HCL 10 MG PO TABS
10.0000 mg | ORAL_TABLET | Freq: Three times a day (TID) | ORAL | 0 refills | Status: AC | PRN
Start: 2023-04-01 — End: ?

## 2023-04-01 NOTE — Anesthesia Preprocedure Evaluation (Addendum)
Anesthesia Evaluation  Patient identified by MRN, date of birth, ID band Patient awake    Reviewed: Allergy & Precautions, H&P , NPO status , Patient's Chart, lab work & pertinent test results  Airway Mallampati: III  TM Distance: <3 FB Neck ROM: Full    Dental no notable dental hx.    Pulmonary neg pulmonary ROS   Pulmonary exam normal breath sounds clear to auscultation       Cardiovascular hypertension, negative cardio ROS Normal cardiovascular exam Rhythm:Regular Rate:Normal  07-02-21 EF >55% NTERPRETATION  NORMAL LEFT VENTRICULAR SYSTOLIC FUNCTION  NORMAL RIGHT VENTRICULAR SYSTOLIC FUNCTION  TRIVIAL REGURGITATION NOTED (See above)  NO VALVULAR STENOSIS  Compared with prior Echo study on 01/15/2019 AORTA IS NOW DILATED, RV WALL IS THICKENED  TECHNICALLY DIFFICULT WITH SUBOPTIMAL SOUND PENETRATION    __cho 01-14-22 for hx benign essential hypertension and aortic root dilatation     Echo report reads EF 55%, aortic root "normal" and "indeterminate" for any dilatation.  Trileaflet aortic valve   _______________________________________________________________________________________     Neuro/Psych  Headaches  Neuromuscular disease negative neurological ROS  negative psych ROS   GI/Hepatic negative GI ROS, Neg liver ROS, PUD,GERD  ,,  Endo/Other  negative endocrine ROSHypothyroidism    Renal/GU negative Renal ROS  negative genitourinary   Musculoskeletal negative musculoskeletal ROS (+) Arthritis ,  Fibromyalgia -  Abdominal   Peds negative pediatric ROS (+)  Hematology negative hematology ROS (+)   Anesthesia Other Findings Fibromyalgia  Sjogrens syndrome (HCC) Hypertension  Degenerative joint disease Hypothyroid  Allergic rhinitis Hyperlipidemia  Mitral valve prolapse GERD (gastroesophageal reflux disease) Redness of both eyes Benign neoplasm of ascending colon Benign neoplasm of cecum Hx of abnormal  cervical Pap smear Pre-diabetes PVCs (premature ventricular contractions) Acute gastritis without hemorrhage Distal radius fracture, left  Aortic root enlargement (HCC)    Reproductive/Obstetrics negative OB ROS                              Anesthesia Physical Anesthesia Plan  ASA: 3  Anesthesia Plan: General   Post-op Pain Management:    Induction: Intravenous  PONV Risk Score and Plan:   Airway Management Planned: Natural Airway and Nasal Cannula  Additional Equipment:   Intra-op Plan:   Post-operative Plan:   Informed Consent: I have reviewed the patients History and Physical, chart, labs and discussed the procedure including the risks, benefits and alternatives for the proposed anesthesia with the patient or authorized representative who has indicated his/her understanding and acceptance.     Dental Advisory Given  Plan Discussed with: Anesthesiologist, CRNA and Surgeon  Anesthesia Plan Comments: (Patient consented for risks of anesthesia including but not limited to:  - adverse reactions to medications - risk of airway placement if required - damage to eyes, teeth, lips or other oral mucosa - nerve damage due to positioning  - sore throat or hoarseness - Damage to heart, brain, nerves, lungs, other parts of body or loss of life  Patient voiced understanding and assent.)         Anesthesia Quick Evaluation

## 2023-04-01 NOTE — Telephone Encounter (Signed)
Pt called for x-ray results. Shared provider's note. Pt is not interested in PT at this time.  Reubin Milan, MD 04/01/2023  2:31 PM EST     Good news - no evidence of compression fracture.  Take the Flexeril and the higher dose of gabapentin.  If desired, I can refer to PT.

## 2023-04-01 NOTE — Patient Instructions (Signed)
Increase Gabapentin to 4 times per day - call for a new Rx if needed.  Try ice in place of heat.  Substitute Flexeril or Tizanidine for the new 10 days.

## 2023-04-01 NOTE — Progress Notes (Signed)
Date:  04/01/2023   Name:  Ashley Glover   DOB:  07-Jan-1961   MRN:  [id number]   Chief Complaint: Back Pain (Started years ago. Has flare ups on and off. This has been ongoing for 1 week. No injury or falls recently. )  Back Pain This is a recurrent problem. The current episode started in the past 7 days. The problem occurs constantly. The problem has been gradually worsening since onset. The pain is present in the thoracic spine. The quality of the pain is described as shooting and stabbing. The pain does not radiate. The pain is at a severity of 8/10. The pain is severe. The symptoms are aggravated by twisting, coughing and bending. Pertinent negatives include no chest pain, fever, numbness or weakness. She has tried analgesics, heat and muscle relaxant for the symptoms. The treatment provided no relief.    Review of Systems  Constitutional:  Positive for fatigue. Negative for chills and fever.  Respiratory:  Negative for chest tightness and shortness of breath.   Cardiovascular:  Negative for chest pain.  Musculoskeletal:  Positive for back pain and myalgias.  Skin:        Several skin lesions; pigmented lesion on left hand being monitored  Neurological:  Negative for weakness and numbness.     Lab Results  Component Value Date   NA 145 (H) 12/24/2022   K 4.9 12/24/2022   CO2 29 12/24/2022   GLUCOSE 100 (H) 12/24/2022   BUN 10 12/24/2022   CREATININE 0.78 12/24/2022   CALCIUM 9.3 12/24/2022   EGFR 85 12/24/2022   GFRNONAA >60 08/24/2020   Lab Results  Component Value Date   CHOL 158 12/24/2022   HDL 61 12/24/2022   LDLCALC 68 12/24/2022   TRIG 174 (H) 12/24/2022   CHOLHDL 2.6 12/24/2022   Lab Results  Component Value Date   TSH 3.380 12/24/2022   Lab Results  Component Value Date   HGBA1C 6.1 (H) 12/24/2022   Lab Results  Component Value Date   WBC 10.5 12/24/2022   HGB 14.2 12/24/2022   HCT 45.6 12/24/2022   MCV 92 12/24/2022   PLT 276 12/24/2022    Lab Results  Component Value Date   ALT 54 (H) 12/24/2022   AST 29 12/24/2022   ALKPHOS 115 12/24/2022   BILITOT 0.3 12/24/2022   Lab Results  Component Value Date   VD25OH 57.3 12/24/2022     Patient Active Problem List   Diagnosis Date Noted   Prediabetes 03/01/2022   Aortic root dilatation (HCC) 01/16/2022   S/P total hysterectomy 03/16/2020   Dermatitis of both ear canals 03/01/2020   Pruritus 01/25/2020   Insomnia 06/25/2019   Osteopenia determined by x-ray 06/23/2018   Chronic otitis media 09/30/2017   Migraine without aura and without status migrainosus, not intractable 05/21/2017   Not currently working due to disabled status 01/24/2017   Degenerative arthritis 01/24/2017   Frequent PVCs 08/20/2016   Hx of colonic polyps 04/24/2016   Periodic headache syndrome, not intractable 01/31/2016   Rosacea, acne 01/31/2016   Essential hypertension 08/02/2015   Hypothyroidism due to acquired atrophy of thyroid 08/02/2015   Fibromyalgia 08/02/2015   Sjogren's syndrome (HCC) 08/02/2015   Peptic ulcer disease 08/02/2015   Primary osteoarthritis of both knees 08/02/2015   Other allergic rhinitis 08/02/2015   Hyperlipidemia, mixed 08/02/2015    Allergies  Allergen Reactions   Ace Inhibitors Cough   Ketamine     Pt did  not  like the way it made her feel   Morphine And Codeine Nausea Only   Savella [Milnacipran Hcl] Palpitations    Past Surgical History:  Procedure Laterality Date   AUGMENTATION MAMMAPLASTY Bilateral 1987   COLONOSCOPY  05/2012   polyps   COLONOSCOPY WITH PROPOFOL N/A 06/20/2016   Procedure: COLONOSCOPY WITH PROPOFOL;  Surgeon: Midge Minium, MD;  Location: [facility];  Service: Endoscopy;  Laterality: N/A;   COLONOSCOPY WITH PROPOFOL N/A 11/11/2019   Procedure: COLONOSCOPY WITH PROPOFOL;  Surgeon: Midge Minium, MD;  Location: [facility];  Service: Endoscopy;  Laterality: N/A;  3   COLONOSCOPY WITH PROPOFOL N/A 03/06/2020    Procedure: COLONOSCOPY WITH BIOPSY;  Surgeon: Midge Minium, MD;  Location: [facility];  Service: Endoscopy;  Laterality: N/A;   ESOPHAGOGASTRODUODENOSCOPY (EGD) WITH PROPOFOL N/A 06/20/2016   Procedure: ESOPHAGOGASTRODUODENOSCOPY (EGD) WITH PROPOFOL;  Surgeon: Midge Minium, MD;  Location: [facility];  Service: Endoscopy;  Laterality: N/A;   ESOPHAGOGASTRODUODENOSCOPY (EGD) WITH PROPOFOL N/A 11/11/2019   Procedure: ESOPHAGOGASTRODUODENOSCOPY (EGD) WITH PROPOFOL;  Surgeon: Midge Minium, MD;  Location: [facility];  Service: Endoscopy;  Laterality: N/A;   ESOPHAGOGASTRODUODENOSCOPY (EGD) WITH PROPOFOL N/A 03/06/2020   Procedure: ESOPHAGOGASTRODUODENOSCOPY (EGD) WITH BIOPSY;  Surgeon: Midge Minium, MD;  Location: [facility];  Service: Endoscopy;  Laterality: N/A;   FOOT SURGERY Right    GANGLION CYST EXCISION Left    NASAL SINUS SURGERY     NEUROMA SURGERY Left    OPEN REDUCTION INTERNAL FIXATION (ORIF) DISTAL RADIAL FRACTURE Left 08/24/2020   Procedure: OPEN REDUCTION INTERNAL FIXATION (ORIF) DISTAL RADIAL FRACTURE;  Surgeon: Christena Flake, MD;  Location: [facility] ORS;  Service: Orthopedics;  Laterality: Left;   OPEN REDUCTION INTERNAL FIXATION (ORIF) DISTAL RADIAL FRACTURE Right 09/13/2022   Procedure: OPEN REDUCTION INTERNAL FIXATION (ORIF) DISTAL RADIUS FRACTURE;  Surgeon: Kennedy Bucker, MD;  Location: [facility];  Service: Orthopedics;  Laterality: Right;   PLACEMENT OF BREAST IMPLANTS     POLYPECTOMY  06/20/2016   Procedure: POLYPECTOMY;  Surgeon: Midge Minium, MD;  Location: [facility];  Service: Endoscopy;;   POLYPECTOMY N/A 03/06/2020   Procedure: POLYPECTOMY;  Surgeon: Midge Minium, MD;  Location: [facility];  Service: Endoscopy;  Laterality: N/A;   REPLACEMENT TOTAL KNEE Right 06/2014   TONSILLECTOMY     TOTAL VAGINAL HYSTERECTOMY  1996   cervical dysplasia    Social History   Tobacco Use   Smoking status: Never   Smokeless  tobacco: Never  Vaping Use   Vaping status: Never Used  Substance Use Topics   Alcohol use: No    Alcohol/week: 0.0 standard drinks of alcohol   Drug use: No     Medication list has been reviewed and updated.  Current Meds  Medication Sig   butalbital-apap-caffeine-codeine (FIORICET WITH CODEINE) 50-325-40-30 MG capsule TAKE 1 TO 2 CAPSULES BY MOUTH EVERY 6 HOURS AS NEEDED FOR  HEADACHE  OR  MIGRAINE   Calcium Carb-Cholecalciferol (CALCIUM 600 + D PO) Take by mouth daily.   cyclobenzaprine (FLEXERIL) 10 MG tablet Take 1 tablet (10 mg total) by mouth 3 (three) times daily as needed for muscle spasms.   DULoxetine (CYMBALTA) 60 MG capsule TAKE 1 CAPSULE BY MOUTH DAILY   furosemide (LASIX) 40 MG tablet TAKE 1 TABLET BY MOUTH DAILY   gabapentin (NEURONTIN) 600 MG tablet TAKE 1 TABLET BY MOUTH 3 TIMES  DAILY   hydrochlorothiazide (HYDRODIURIL) 12.5 MG tablet Take 12.5 mg by mouth daily.  hydrocortisone 2.5 % cream APPLY TOPICALLY TO AFFECTED  AREA(S) TOPICALLY TWICE DAILY   levothyroxine (SYNTHROID) 88 MCG tablet Take 1 tablet (88 mcg total) by mouth daily before breakfast.   mometasone (ELOCON) 0.1 % lotion Apply topically daily. To both ears as needed   Multiple Vitamin (MULTIVITAMIN ADULT PO) Take 1 tablet by mouth daily.   Omega-3 Fatty Acids (FISH OIL PO) Take by mouth.   pantoprazole (PROTONIX) 40 MG tablet TAKE 1 TABLET BY MOUTH TWICE  DAILY   promethazine (PHENERGAN) 25 MG tablet TAKE 1 TABLET BY MOUTH EVERY 8 HOURS AS NEEDED FOR NAUSEA AND FOR VOMITING   propranolol (INDERAL) 40 MG tablet TAKE 1 TABLET BY MOUTH TWICE  DAILY   rosuvastatin (CRESTOR) 20 MG tablet Take 20 mg by mouth at bedtime.   tiZANidine (ZANAFLEX) 4 MG tablet TAKE 1 TABLET BY MOUTH 3 TIMES  DAILY   tobramycin-dexamethasone (TOBRADEX) ophthalmic solution Place 1 drop into both eyes every 6 (six) hours.       04/01/2023   10:23 AM 12/24/2022    9:39 AM 03/01/2022    9:51 AM 09/19/2021    9:29 AM  GAD 7 :  Generalized Anxiety Score  Nervous, Anxious, on Edge 0 0 0 0  Control/stop worrying 0 0 0 0  Worry too much - different things 0 0 0 0  Trouble relaxing 0 0 0 0  Restless 0 0 0 0  Easily annoyed or irritable 0 0 0 0  Afraid - awful might happen 0 0 0 0  Total GAD 7 Score 0 0 0 0  Anxiety Difficulty Not difficult at all Not difficult at all Not difficult at all Not difficult at all       04/01/2023   10:23 AM 12/24/2022    9:39 AM 03/01/2022    9:51 AM  Depression screen PHQ 2/9  Decreased Interest 0 0 0  Down, Depressed, Hopeless 0 0 0  PHQ - 2 Score 0 0 0  Altered sleeping 0 0 0  Tired, decreased energy 0 0 0  Change in appetite 0 0 0  Feeling bad or failure about yourself  0 0 0  Trouble concentrating 0 0 0  Moving slowly or fidgety/restless 0 0 0  Suicidal thoughts 0 0 0  PHQ-9 Score 0 0 0  Difficult doing work/chores Not difficult at all Not difficult at all Not difficult at all    BP Readings from Last 3 Encounters:  04/01/23 128/79  12/24/22 128/79  09/13/22 (!) 155/102    Physical Exam Vitals and nursing note reviewed.  Constitutional:      General: She is in acute distress.     Appearance: She is well-developed.  HENT:     Head: Normocephalic and atraumatic.  Cardiovascular:     Rate and Rhythm: Normal rate and regular rhythm.  Pulmonary:     Effort: Pulmonary effort is normal. No respiratory distress.     Breath sounds: No wheezing or rhonchi.  Musculoskeletal:     Cervical back: Spasms present. Decreased range of motion.     Thoracic back: Bony tenderness present. No swelling or spasms.       Back:     Comments: Pain to palpation over upper thoracic spine  Skin:    General: Skin is warm and dry.     Findings: No rash.          Comments: Benign appearing flat pigmented macule on dorsum of left hand  Neurological:  Mental Status: She is alert and oriented to person, place, and time.  Psychiatric:        Mood and Affect: Mood normal.         Behavior: Behavior normal.     Wt Readings from Last 3 Encounters:  04/01/23 192 lb (87.1 kg)  12/24/22 196 lb 9.6 oz (89.2 kg)  09/13/22 193 lb 8 oz (87.8 kg)    BP 128/79 (BP Location: Right Arm, Cuff Size: Large)   Pulse 61   Ht 5\' 4"  (1.626 m)   Wt 192 lb (87.1 kg)   SpO2 94%   BMI 32.96 kg/m   Assessment and Plan:  Problem List Items Addressed This Visit   None Visit Diagnoses     Acute midline thoracic back pain    -  Primary   concern for possible compression fracture increase gabapentin to QID Flexeril in place of tizanidine consider PT if no fx and no improvement   Relevant Medications   cyclobenzaprine (FLEXERIL) 10 MG tablet   Other Relevant Orders   DG Thoracic Spine 2 View   Neoplasm of uncertain behavior of skin       Relevant Orders   Ambulatory referral to Dermatology       No follow-ups on file.    Reubin Milan, MD [facility] [place]

## 2023-04-02 ENCOUNTER — Ambulatory Visit: Payer: Self-pay | Admitting: *Deleted

## 2023-04-02 NOTE — Telephone Encounter (Signed)
  Chief Complaint: requesting if medication can be changed back to zanaflex and if dose can be increased Symptoms: flexeril not helping with back spasms. Mobility worsening with any movement or walking , becomes severe.  Frequency: today  Pertinent Negatives: Patient denies na Disposition: [] ED /[] Urgent Care (no appt availability in office) / [] Appointment(In office/virtual)/ []  [place] [facility]/ [] Home Care/ [] Refused Recommended Disposition /[] [place] [facility]/ [x]  Follow-up with PCP Additional Notes:   Patient reports she has tried flexeril 3 times day yesterday and feels medication not as effective as zanaflex. Would like advice from PCP if she should continue flexeril or go back to zanaflex and increase dose if that is possible. Trying tylenol as well and no decrease in pain esp. With movement.  Patient also requesting if she can get a copy of her labs since June bc she does not have a computer to view or able to view or print from phone. Address in chart verified to be correct with patient. Please advise.    Summary: medication questions   Patient states that the cyclobenzaprine (FLEXERIL) 10 MG tablet that she was given is not working and wants to know if she can go back to tiZANidine (ZANAFLEX) 4 MG tablet and just increase the dose. Please advise.                 Reason for Disposition  Caller wants to use a complementary or alternative medicine  Answer Assessment - Initial Assessment Questions 1. NAME of MEDICINE: "What medicine(s) are you calling about?"     Flexeril and zanaflex 2. QUESTION: "What is your question?" (e.g., double dose of medicine, side effect)     Can patient take zanaflex and possible to increase dose for back spasms?  3. PRESCRIBER: "Who prescribed the medicine?" Reason: if prescribed by specialist, call should be referred to that group.     PCP 4. SYMPTOMS: "Do you have any symptoms?" If Yes, ask: "What symptoms are you having?"  "How  bad are the symptoms (e.g., mild, moderate, severe)     Flexeril not helping with spasms. Severe pain with any mobility  5. PREGNANCY:  "Is there any chance that you are pregnant?" "When was your last menstrual period?"     na  Protocols used: Medication Question Call-A-AH

## 2023-04-02 NOTE — Telephone Encounter (Signed)
Patient informed.  Ashley Glover 

## 2023-04-03 NOTE — Telephone Encounter (Signed)
Copied from CRM [id number]. Topic: General - Other >> Apr 03, 2023 12:38 PM Ashley Glover P wrote: Reason for CRM: pt came in and seen provider for back pain.  She had an xray and she said it was neg but her back is still hurting and seems worse.  She wants to know if she can have a mri.  CB@  [phone]

## 2023-04-04 ENCOUNTER — Telehealth: Payer: Self-pay | Admitting: Internal Medicine

## 2023-04-04 NOTE — Telephone Encounter (Signed)
Copied from CRM [id number]. Topic: Referral - Question >> Apr 04, 2023  9:20 AM Marlow Baars wrote: Reason for CRM: The patient called in stating she does not want to proceed with Physical Therapy as it cost too much and she is hurting too much. She said she has had it in the past and it has not helped that much. Please assist patient further. She is frustrated because she really feels like an MRI is warranted and will be able to tell exactly what it is.

## 2023-04-07 ENCOUNTER — Other Ambulatory Visit: Payer: Self-pay | Admitting: *Deleted

## 2023-04-07 ENCOUNTER — Ambulatory Visit: Payer: Medicare Other | Admitting: Anesthesiology

## 2023-04-07 ENCOUNTER — Telehealth: Payer: Self-pay | Admitting: Internal Medicine

## 2023-04-07 ENCOUNTER — Telehealth: Payer: Self-pay

## 2023-04-07 ENCOUNTER — Other Ambulatory Visit: Payer: Self-pay

## 2023-04-07 ENCOUNTER — Telehealth: Payer: Self-pay | Admitting: *Deleted

## 2023-04-07 ENCOUNTER — Encounter
Admission: RE | Disposition: A | Discharge status: Home / Self Care | Payer: Self-pay | Source: Home / Self Care | Attending: Gastroenterology

## 2023-04-07 ENCOUNTER — Encounter: Payer: Self-pay | Admitting: Gastroenterology

## 2023-04-07 ENCOUNTER — Ambulatory Visit
Admission: RE | Admit: 2023-04-07 | Discharge: 2023-04-07 | Disposition: A | Discharge status: Home / Self Care | Payer: Medicare Other | Attending: Gastroenterology | Admitting: Gastroenterology

## 2023-04-07 DIAGNOSIS — Z1211 Encounter for screening for malignant neoplasm of colon: Secondary | ICD-10-CM | POA: Diagnosis not present

## 2023-04-07 DIAGNOSIS — Z860100 Personal history of colon polyps, unspecified: Secondary | ICD-10-CM

## 2023-04-07 DIAGNOSIS — Z538 Procedure and treatment not carried out for other reasons: Secondary | ICD-10-CM | POA: Diagnosis not present

## 2023-04-07 DIAGNOSIS — Z8719 Personal history of other diseases of the digestive system: Secondary | ICD-10-CM

## 2023-04-07 HISTORY — DX: Migraine, unspecified, not intractable, without status migrainosus: G43.909

## 2023-04-07 HISTORY — DX: Hypothyroidism, unspecified: E03.9

## 2023-04-07 HISTORY — DX: Other specified disorders of arteries and arterioles: I77.89

## 2023-04-07 SURGERY — COLONOSCOPY WITH PROPOFOL
Anesthesia: General

## 2023-04-07 MED ORDER — SODIUM CHLORIDE 0.9% FLUSH: 10.0000 mL | INTRAVENOUS | Status: DC | PRN

## 2023-04-07 MED ORDER — SODIUM CHLORIDE 0.9 % IV SOLN
INTRAVENOUS | Status: DC
Start: 2023-04-07 — End: 2023-04-07

## 2023-04-07 NOTE — Telephone Encounter (Signed)
Spoken to patient and she will call back to reschedule. She have to discuss dates with her neighbor who is bringing her. Gave patient some dates, Dr Servando Snare is available in [place].

## 2023-04-07 NOTE — Telephone Encounter (Signed)
Patient left a voicemail and states she was schedule for a colonoscopy and EGD today with Dr, Servando Snare at [facility] surgery center but called them this morning and he said it would be best for her to rescheduled the procedure with 2 day prep because she was not clean out this morning. She is wanting to get this reschedule.

## 2023-04-07 NOTE — Telephone Encounter (Signed)
Copied from CRM [id number]. Topic: General - Other >> Apr 07, 2023  3:08 PM Marlow Baars wrote: Reason for CRM: The patient returned Chassidy phone call. Please assist patient further

## 2023-04-07 NOTE — Telephone Encounter (Signed)
Patient said she declined to do a P due to copay being $40 every visit.. Will continue to rest and take medication at this time.

## 2023-04-07 NOTE — Telephone Encounter (Signed)
Called and left patient a detailed message informing this. Told her insurance requires to fail PT before they will cover an MRI. Told her to call back if she would like [place] to order PT.  - Myrth Dahan

## 2023-04-08 NOTE — Telephone Encounter (Signed)
Request reschedule to 04/28/2023

## 2023-04-08 NOTE — Telephone Encounter (Signed)
Requested to be reschedule on 04/28/2023.  Left sample of Clenpiq in the front office for patient, 2 boxes so patient can complete 2 day prep  New instructions will left with the sample as well.

## 2023-04-08 NOTE — Telephone Encounter (Signed)
Patient called office on 04/07/2023, she did not go through with the procedure.   Denman George, [facility]  04/07/23  3:39 PM Note Patient left a voicemail and states she was schedule for a colonoscopy and EGD today with Dr, Servando Snare at [facility] surgery center but called them this morning and he said it would be best for her to rescheduled the procedure with 2 day prep because she was not clean out this morning. She is wanting to get this reschedule.

## 2023-04-11 DIAGNOSIS — S39012A Strain of muscle, fascia and tendon of lower back, initial encounter: Secondary | ICD-10-CM | POA: Diagnosis not present

## 2023-04-11 DIAGNOSIS — M47896 Other spondylosis, lumbar region: Secondary | ICD-10-CM | POA: Diagnosis not present

## 2023-04-15 DIAGNOSIS — H0289 Other specified disorders of eyelid: Secondary | ICD-10-CM | POA: Diagnosis not present

## 2023-04-15 DIAGNOSIS — H43813 Vitreous degeneration, bilateral: Secondary | ICD-10-CM | POA: Diagnosis not present

## 2023-04-15 DIAGNOSIS — M3501 Sicca syndrome with keratoconjunctivitis: Secondary | ICD-10-CM | POA: Diagnosis not present

## 2023-04-15 DIAGNOSIS — H2513 Age-related nuclear cataract, bilateral: Secondary | ICD-10-CM | POA: Diagnosis not present

## 2023-04-19 ENCOUNTER — Other Ambulatory Visit: Payer: Self-pay | Admitting: Internal Medicine

## 2023-04-19 DIAGNOSIS — I10 Essential (primary) hypertension: Secondary | ICD-10-CM

## 2023-04-19 DIAGNOSIS — M797 Fibromyalgia: Secondary | ICD-10-CM

## 2023-04-21 ENCOUNTER — Encounter: Payer: Self-pay | Admitting: Gastroenterology

## 2023-04-21 NOTE — Telephone Encounter (Signed)
Requested Prescriptions  Pending Prescriptions Disp Refills   gabapentin (NEURONTIN) 600 MG tablet [Pharmacy Med Name: Gabapentin 600 MG Oral Tablet] 300 tablet 2    Sig: TAKE 1 TABLET BY MOUTH 3 TIMES  DAILY     Neurology: Anticonvulsants - gabapentin Passed - 04/19/2023  4:40 AM      Passed - Cr in normal range and within 360 days    Creatinine, Ser  Date Value Ref Range Status  12/24/2022 0.78 0.57 - 1.00 mg/dL Final         Passed - Completed PHQ-2 or PHQ-9 in the last 360 days      Passed - Valid encounter within last 12 months    Recent Outpatient Visits           2 weeks ago Acute midline thoracic back pain   [place] [facility] at [facility], Nyoka Cowden, MD   3 months ago Annual physical exam   [place] [facility] at [facility], Nyoka Cowden, MD   1 year ago Essential hypertension   [place] [facility] at [facility], Nyoka Cowden, MD   1 year ago Annual physical exam   [place] [facility] at [facility], Nyoka Cowden, MD   2 years ago Essential hypertension   [place] [facility] at [facility], Nyoka Cowden, MD       Future Appointments             In 1 month Judithann Graves Nyoka Cowden, MD [facility] at [facility], [facility]   In 9 months Willeen Niece, MD [facility]             furosemide (LASIX) 40 MG tablet [Pharmacy Med Name: Furosemide 40 MG Oral Tablet] 100 tablet 2    Sig: TAKE 1 TABLET BY MOUTH DAILY     Cardiovascular:  Diuretics - Loop Failed - 04/19/2023  4:40 AM      Failed - Na in normal range and within 180 days    Sodium  Date Value Ref Range Status  12/24/2022 145 (H) 134 - 144 mmol/L Final         Failed - Mg Level in normal range and within 180 days    No results found for: "MG"       Failed - Last BP in normal range    BP  Readings from Last 1 Encounters:  04/07/23 (!) 125/94         Passed - K in normal range and within 180 days    Potassium  Date Value Ref Range Status  12/24/2022 4.9 3.5 - 5.2 mmol/L Final         Passed - Ca in normal range and within 180 days    Calcium  Date Value Ref Range Status  12/24/2022 9.3 8.7 - 10.3 mg/dL Final         Passed - Cr in normal range and within 180 days    Creatinine, Ser  Date Value Ref Range Status  12/24/2022 0.78 0.57 - 1.00 mg/dL Final         Passed - Cl in normal range and within 180 days    Chloride  Date Value Ref Range Status  12/24/2022 103 96 - 106 mmol/L Final         Passed - Valid  encounter within last 6 months    Recent Outpatient Visits           2 weeks ago Acute midline thoracic back pain   [place] [facility] at [facility], Nyoka Cowden, MD   3 months ago Annual physical exam   [facility] at [facility], Nyoka Cowden, MD   1 year ago Essential hypertension   [place] [facility] at [facility], Nyoka Cowden, MD   1 year ago Annual physical exam   [facility] at [facility], Nyoka Cowden, MD   2 years ago Essential hypertension   [place] [facility] at [facility], Nyoka Cowden, MD       Future Appointments             In 1 month Judithann Graves Nyoka Cowden, MD [facility] at [facility], [facility]   In 9 months Willeen Niece, MD [place] [facility]             propranolol (INDERAL) 40 MG tablet [Pharmacy Med Name: PROPRANOLOL  40MG   TAB] 200 tablet 2    Sig: TAKE 1 TABLET BY MOUTH TWICE  DAILY     Cardiovascular:  Beta Blockers Failed - 04/19/2023  4:40 AM      Failed - Last BP in normal range    BP Readings from Last 1 Encounters:  04/07/23 (!) 125/94         Passed - Last Heart Rate in normal range     Pulse Readings from Last 1 Encounters:  04/01/23 61         Passed - Valid encounter within last 6 months    Recent Outpatient Visits           2 weeks ago Acute midline thoracic back pain   [place] [facility] at [facility], Nyoka Cowden, MD   3 months ago Annual physical exam   [facility] at [facility], Nyoka Cowden, MD   1 year ago Essential hypertension   [place] [facility] at [facility], Nyoka Cowden, MD   1 year ago Annual physical exam   [facility] at [facility], Nyoka Cowden, MD   2 years ago Essential hypertension   [facility] at [facility], Nyoka Cowden, MD       Future Appointments             In 1 month Judithann Graves, Nyoka Cowden, MD [facility] at [facility], [facility]   In 9 months Willeen Niece, MD [facility]

## 2023-04-23 DIAGNOSIS — I77810 Thoracic aortic ectasia: Secondary | ICD-10-CM | POA: Diagnosis not present

## 2023-04-23 DIAGNOSIS — E782 Mixed hyperlipidemia: Secondary | ICD-10-CM | POA: Diagnosis not present

## 2023-04-23 DIAGNOSIS — I493 Ventricular premature depolarization: Secondary | ICD-10-CM | POA: Diagnosis not present

## 2023-04-23 DIAGNOSIS — I10 Essential (primary) hypertension: Secondary | ICD-10-CM | POA: Diagnosis not present

## 2023-04-28 ENCOUNTER — Ambulatory Visit: Payer: Medicare Other | Admitting: Anesthesiology

## 2023-04-28 ENCOUNTER — Encounter: Payer: Self-pay | Admitting: Gastroenterology

## 2023-04-28 ENCOUNTER — Encounter
Admission: RE | Disposition: A | Discharge status: Home / Self Care | Payer: Self-pay | Source: Home / Self Care | Attending: Gastroenterology

## 2023-04-28 ENCOUNTER — Other Ambulatory Visit: Payer: Self-pay

## 2023-04-28 ENCOUNTER — Ambulatory Visit
Admission: RE | Admit: 2023-04-28 | Discharge: 2023-04-28 | Disposition: A | Discharge status: Home / Self Care | Payer: Medicare Other | Attending: Gastroenterology | Admitting: Gastroenterology

## 2023-04-28 DIAGNOSIS — Z8249 Family history of ischemic heart disease and other diseases of the circulatory system: Secondary | ICD-10-CM | POA: Diagnosis not present

## 2023-04-28 DIAGNOSIS — Z8711 Personal history of peptic ulcer disease: Secondary | ICD-10-CM | POA: Insufficient documentation

## 2023-04-28 DIAGNOSIS — E039 Hypothyroidism, unspecified: Secondary | ICD-10-CM | POA: Insufficient documentation

## 2023-04-28 DIAGNOSIS — D124 Benign neoplasm of descending colon: Secondary | ICD-10-CM | POA: Insufficient documentation

## 2023-04-28 DIAGNOSIS — R12 Heartburn: Secondary | ICD-10-CM | POA: Diagnosis present

## 2023-04-28 DIAGNOSIS — Z1211 Encounter for screening for malignant neoplasm of colon: Secondary | ICD-10-CM | POA: Insufficient documentation

## 2023-04-28 DIAGNOSIS — Z8349 Family history of other endocrine, nutritional and metabolic diseases: Secondary | ICD-10-CM | POA: Insufficient documentation

## 2023-04-28 DIAGNOSIS — I10 Essential (primary) hypertension: Secondary | ICD-10-CM | POA: Insufficient documentation

## 2023-04-28 DIAGNOSIS — K5730 Diverticulosis of large intestine without perforation or abscess without bleeding: Secondary | ICD-10-CM | POA: Insufficient documentation

## 2023-04-28 DIAGNOSIS — D123 Benign neoplasm of transverse colon: Secondary | ICD-10-CM | POA: Diagnosis not present

## 2023-04-28 DIAGNOSIS — Z8719 Personal history of other diseases of the digestive system: Secondary | ICD-10-CM

## 2023-04-28 DIAGNOSIS — D122 Benign neoplasm of ascending colon: Secondary | ICD-10-CM | POA: Insufficient documentation

## 2023-04-28 DIAGNOSIS — K641 Second degree hemorrhoids: Secondary | ICD-10-CM | POA: Insufficient documentation

## 2023-04-28 DIAGNOSIS — Z860100 Personal history of colon polyps, unspecified: Secondary | ICD-10-CM

## 2023-04-28 DIAGNOSIS — K317 Polyp of stomach and duodenum: Secondary | ICD-10-CM | POA: Insufficient documentation

## 2023-04-28 DIAGNOSIS — K635 Polyp of colon: Secondary | ICD-10-CM

## 2023-04-28 DIAGNOSIS — Z860101 Personal history of adenomatous and serrated colon polyps: Secondary | ICD-10-CM | POA: Diagnosis present

## 2023-04-28 DIAGNOSIS — G709 Myoneural disorder, unspecified: Secondary | ICD-10-CM | POA: Diagnosis not present

## 2023-04-28 DIAGNOSIS — D125 Benign neoplasm of sigmoid colon: Secondary | ICD-10-CM | POA: Insufficient documentation

## 2023-04-28 DIAGNOSIS — K219 Gastro-esophageal reflux disease without esophagitis: Secondary | ICD-10-CM | POA: Diagnosis not present

## 2023-04-28 HISTORY — PX: POLYPECTOMY: SHX5525

## 2023-04-28 HISTORY — PX: COLONOSCOPY WITH PROPOFOL: SHX5780

## 2023-04-28 HISTORY — PX: ESOPHAGOGASTRODUODENOSCOPY (EGD) WITH PROPOFOL: SHX5813

## 2023-04-28 SURGERY — COLONOSCOPY WITH PROPOFOL
Anesthesia: General | Site: Rectum

## 2023-04-28 MED ORDER — SODIUM CHLORIDE 0.9% FLUSH: 10.0000 mL | Freq: Two times a day (BID) | INTRAVENOUS | Status: DC

## 2023-04-28 MED ORDER — PROPOFOL 10 MG/ML IV BOLUS
INTRAVENOUS | Status: DC | PRN
  Administered 2023-04-28: 40 mg via INTRAVENOUS
  Administered 2023-04-28: 20 mg via INTRAVENOUS
  Administered 2023-04-28: 40 mg via INTRAVENOUS
  Administered 2023-04-28: 30 mg via INTRAVENOUS
  Administered 2023-04-28: 40 mg via INTRAVENOUS
  Administered 2023-04-28: 50 mg via INTRAVENOUS
  Administered 2023-04-28: 100 mg via INTRAVENOUS

## 2023-04-28 MED ORDER — SODIUM CHLORIDE 0.9 % IV SOLN
INTRAVENOUS | Status: DC
Start: 2023-04-28 — End: 2023-04-28

## 2023-04-28 MED ORDER — PROPOFOL 1000 MG/100ML IV EMUL
INTRAVENOUS | Status: AC
  Filled 2023-04-28: qty 100

## 2023-04-28 MED ORDER — PROPOFOL 10 MG/ML IV BOLUS
INTRAVENOUS | Status: AC
Start: 2023-04-28 — End: ?
  Filled 2023-04-28: qty 20

## 2023-04-28 MED ORDER — LIDOCAINE 2% (20 MG/ML) 5 ML SYRINGE
INTRAMUSCULAR | Status: DC | PRN
  Administered 2023-04-28: 100 mg via INTRAVENOUS

## 2023-04-28 MED ORDER — STERILE WATER FOR IRRIGATION IR SOLN
Status: DC | PRN
  Administered 2023-04-28: 150 mL

## 2023-04-28 MED ORDER — LIDOCAINE HCL (PF) 2 % IJ SOLN
INTRAMUSCULAR | Status: AC
  Filled 2023-04-28: qty 5

## 2023-04-28 MED ORDER — LACTATED RINGERS IV SOLN: INTRAVENOUS | Status: DC

## 2023-04-28 SURGICAL SUPPLY — 8 items
BLOCK BITE 60FR ADLT L/F GRN (MISCELLANEOUS) ×3 IMPLANT
FORCEPS BIOP RAD 4 LRG CAP 4 (CUTTING FORCEPS) IMPLANT
GOWN CVR UNV OPN BCK APRN NK (MISCELLANEOUS) ×6 IMPLANT
KIT PRC NS LF DISP ENDO (KITS) ×3 IMPLANT
MANIFOLD NEPTUNE II (INSTRUMENTS) ×3 IMPLANT
SNARE COLD EXACTO (MISCELLANEOUS) IMPLANT
TRAP ETRAP POLY (MISCELLANEOUS) IMPLANT
WATER STERILE IRR 250ML POUR (IV SOLUTION) ×3 IMPLANT

## 2023-04-28 NOTE — Op Note (Signed)
[facility] Gastroenterology Patient Name: Ashley Glover Procedure Date: 04/28/2023 7:31 AM MRN: [id number] Account #: [id number] Date of Birth: 16-Jan-1961 Admit Type: Outpatient Age: 62 Room: [facility] OR ROOM 01 Gender: Female Note Status: Finalized Instrument Name: [id number] Procedure:             Colonoscopy Indications:           High risk colon cancer surveillance: Personal history                         of colonic polyps Providers:             Midge Minium MD, MD Referring MD:          Bari Edward, MD (Referring MD) Medicines:             Propofol per Anesthesia Complications:         No immediate complications. Procedure:             Pre-Anesthesia Assessment:                        - Prior to the procedure, a History and Physical was                         performed, and patient medications and allergies were                         reviewed. The patient's tolerance of previous                         anesthesia was also reviewed. The risks and benefits                         of the procedure and the sedation options and risks                         were discussed with the patient. All questions were                         answered, and informed consent was obtained. Prior                         Anticoagulants: The patient has taken no anticoagulant                         or antiplatelet agents. ASA Grade Assessment: II - A                         patient with mild systemic disease. After reviewing                         the risks and benefits, the patient was deemed in                         satisfactory condition to undergo the procedure.                        After obtaining informed consent, the colonoscope was  passed under direct vision. Throughout the procedure,                         the patient's blood pressure, pulse, and oxygen                         saturations were monitored continuously. The                          Colonoscope was introduced through the anus and                         advanced to the the cecum, identified by appendiceal                         orifice and ileocecal valve. The colonoscopy was                         performed without difficulty. The patient tolerated                         the procedure well. The quality of the bowel                         preparation was excellent. Findings:      The perianal and digital rectal examinations were normal.      A 4 mm polyp was found in the ascending colon. The polyp was sessile.       The polyp was removed with a cold snare. Resection and retrieval were       complete.      A 4 mm polyp was found in the transverse colon. The polyp was sessile.       The polyp was removed with a cold snare. Resection and retrieval were       complete.      Two sessile polyps were found in the descending colon. The polyps were 2       to 4 mm in size. These polyps were removed with a cold snare. Resection       and retrieval were complete.      A 4 mm polyp was found in the sigmoid colon. The polyp was sessile. The       polyp was removed with a cold snare. Resection and retrieval were       complete.      Many small-mouthed diverticula were found in the sigmoid colon.      Non-bleeding internal hemorrhoids were found during retroflexion. The       hemorrhoids were Grade II (internal hemorrhoids that prolapse but reduce       spontaneously). Impression:            - One 4 mm polyp in the ascending colon, removed with                         a cold snare. Resected and retrieved.                        - One 4 mm polyp in the transverse colon, removed with  a cold snare. Resected and retrieved.                        - Two 2 to 4 mm polyps in the descending colon,                         removed with a cold snare. Resected and retrieved.                        - One 4 mm polyp in the sigmoid colon, removed with a                          cold snare. Resected and retrieved.                        - Diverticulosis in the sigmoid colon.                        - Non-bleeding internal hemorrhoids. Recommendation:        - Discharge patient to home.                        - Resume previous diet.                        - Continue present medications.                        - Await pathology results.                        - Repeat colonoscopy in 5 years for surveillance. Procedure Code(s):     --- Professional ---                        [phone], Colonoscopy, flexible; with removal of                         tumor(s), polyp(s), or other lesion(s) by snare                         technique Diagnosis Code(s):     --- Professional ---                        Z86.010, Personal history of colonic polyps                        D12.5, Benign neoplasm of sigmoid colon CPT copyright 2022 American Medical Association. All rights reserved. The codes documented in this report are preliminary and upon coder review may  be revised to meet current compliance requirements. Midge Minium MD, MD 04/28/2023 8:18:48 AM This report has been signed electronically. Number of Addenda: 0 Note Initiated On: 04/28/2023 7:31 AM Scope Withdrawal Time: 0 hours 9 minutes 41 seconds  Total Procedure Duration: 0 hours 14 minutes 20 seconds  Estimated Blood Loss:  Estimated blood loss: none.      [facility]

## 2023-04-28 NOTE — Anesthesia Postprocedure Evaluation (Signed)
Anesthesia Post Note  Patient: KONYA SCHILD  Procedure(s) Performed: COLONOSCOPY WITH BIOPSY (Rectum) ESOPHAGOGASTRODUODENOSCOPY (EGD) WITH BIOPSY (Mouth) POLYPECTOMY (Rectum)  Patient location during evaluation: PACU Anesthesia Type: General Level of consciousness: awake and alert Pain management: pain level controlled Vital Signs Assessment: post-procedure vital signs reviewed and stable Respiratory status: spontaneous breathing, nonlabored ventilation, respiratory function stable and patient connected to nasal cannula oxygen Cardiovascular status: blood pressure returned to baseline and stable Postop Assessment: no apparent nausea or vomiting Anesthetic complications: no   No notable events documented.   Last Vitals:  Vitals:   04/28/23 0819 04/28/23 0830  BP: (!) 93/55 100/70  Pulse: 62 (!) 57  Resp: (!) 22 (!) 22  Temp: 36.6 C 36.7 C  SpO2: 97% 95%    Last Pain:  Vitals:   04/28/23 0830  TempSrc:   PainSc: 0-No pain                 Yevette Edwards

## 2023-04-28 NOTE — Transfer of Care (Signed)
Immediate Anesthesia Transfer of Care Note  Patient: Ashley Glover  Procedure(s) Performed: COLONOSCOPY WITH BIOPSY (Rectum) ESOPHAGOGASTRODUODENOSCOPY (EGD) WITH BIOPSY (Mouth) POLYPECTOMY (Rectum)  Patient Location: PACU  Anesthesia Type: General  Level of Consciousness: awake, alert  and patient cooperative  Airway and Oxygen Therapy: Patient Spontanous Breathing and Patient connected to supplemental oxygen  Post-op Assessment: Post-op Vital signs reviewed, Patient's Cardiovascular Status Stable, Respiratory Function Stable, Patent Airway and No signs of Nausea or vomiting  Post-op Vital Signs: Reviewed and stable  Complications: No notable events documented.

## 2023-04-28 NOTE — Anesthesia Preprocedure Evaluation (Signed)
Anesthesia Evaluation  Patient identified by MRN, date of birth, ID band Patient awake    Reviewed: Allergy & Precautions, H&P , NPO status , Patient's Chart, lab work & pertinent test results, reviewed documented beta blocker date and time   Airway Mallampati: II   Neck ROM: full    Dental  (+) Poor Dentition   Pulmonary neg pulmonary ROS   Pulmonary exam normal        Cardiovascular Exercise Tolerance: Good hypertension, On Medications negative cardio ROS Normal cardiovascular exam Rhythm:regular Rate:Normal     Neuro/Psych  Headaches  Neuromuscular disease  negative psych ROS   GI/Hepatic Neg liver ROS, PUD,GERD  Medicated,,  Endo/Other  Hypothyroidism    Renal/GU negative Renal ROS  negative genitourinary   Musculoskeletal   Abdominal   Peds  Hematology negative hematology ROS (+)   Anesthesia Other Findings Past Medical History: No date: Acute gastritis without hemorrhage No date: Allergic rhinitis No date: Aortic root enlargement (HCC) No date: Benign neoplasm of ascending colon No date: Benign neoplasm of cecum No date: Degenerative joint disease 08/24/2020: Distal radius fracture, left No date: Fibromyalgia No date: GERD (gastroesophageal reflux disease) 08/02/2015: Hx of abnormal cervical Pap smear     Comment:  1990's - had partial hysterectomy for dysplasia and HPV  No date: Hyperlipidemia No date: Hypertension No date: Hypothyroid No date: Hypothyroidism (acquired) No date: Migraines No date: Mitral valve prolapse     Comment:  states it is mild and does not see a cardiologist No date: Pre-diabetes No date: PVCs (premature ventricular contractions) 09/30/2017: Redness of both eyes No date: Sjogrens syndrome [facility]) Past Surgical History: 1987: AUGMENTATION MAMMAPLASTY; Bilateral 05/2012: COLONOSCOPY     Comment:  polyps 06/20/2016: COLONOSCOPY WITH PROPOFOL; N/A     Comment:  Procedure:  COLONOSCOPY WITH PROPOFOL;  Surgeon: Midge Minium, MD;  Location: [facility] SURGERY CNTR;  Service:               Endoscopy;  Laterality: N/A; 11/11/2019: COLONOSCOPY WITH PROPOFOL; N/A     Comment:  Procedure: COLONOSCOPY WITH PROPOFOL;  Surgeon: Midge Minium, MD;  Location: [facility] SURGERY [facility];  Service:               Endoscopy;  Laterality: N/A;  3 03/06/2020: COLONOSCOPY WITH PROPOFOL; N/A     Comment:  Procedure: COLONOSCOPY WITH BIOPSY;  Surgeon: Midge Minium, MD;  Location: [facility] SURGERY CNTR;  Service:               Endoscopy;  Laterality: N/A; 06/20/2016: ESOPHAGOGASTRODUODENOSCOPY (EGD) WITH PROPOFOL; N/A     Comment:  Procedure: ESOPHAGOGASTRODUODENOSCOPY (EGD) WITH               PROPOFOL;  Surgeon: Midge Minium, MD;  Location: [facility]               SURGERY CNTR;  Service: Endoscopy;  Laterality: N/A; 11/11/2019: ESOPHAGOGASTRODUODENOSCOPY (EGD) WITH PROPOFOL; N/A     Comment:  Procedure: ESOPHAGOGASTRODUODENOSCOPY (EGD) WITH               PROPOFOL;  Surgeon: Midge Minium, MD;  Location: [facility]               SURGERY CNTR;  Service: Endoscopy;  Laterality: N/A; 03/06/2020: ESOPHAGOGASTRODUODENOSCOPY (EGD)  WITH PROPOFOL; N/A     Comment:  Procedure: ESOPHAGOGASTRODUODENOSCOPY (EGD) WITH BIOPSY;              Surgeon: Midge Minium, MD;  Location: [facility] SURGERY               CNTR;  Service: Endoscopy;  Laterality: N/A; No date: FOOT SURGERY; Right No date: GANGLION CYST EXCISION; Left No date: NASAL SINUS SURGERY No date: NEUROMA SURGERY; Left 08/24/2020: OPEN REDUCTION INTERNAL FIXATION (ORIF) DISTAL RADIAL  FRACTURE; Left     Comment:  Procedure: OPEN REDUCTION INTERNAL FIXATION (ORIF)               DISTAL RADIAL FRACTURE;  Surgeon: Christena Flake, MD;                Location: [facility] ORS;  Service: Orthopedics;  Laterality:               Left; 09/13/2022: OPEN REDUCTION INTERNAL FIXATION (ORIF) DISTAL RADIAL  FRACTURE; Right     Comment:   Procedure: OPEN REDUCTION INTERNAL FIXATION (ORIF)               DISTAL RADIUS FRACTURE;  Surgeon: Kennedy Bucker, MD;                Location: [facility] SURGERY CNTR;  Service: Orthopedics;                Laterality: Right; No date: PLACEMENT OF BREAST IMPLANTS 06/20/2016: POLYPECTOMY     Comment:  Procedure: POLYPECTOMY;  Surgeon: Midge Minium, MD;                Location: [facility] SURGERY CN[facility];  Service: Endoscopy;; 03/06/2020: POLYPECTOMY; N/A     Comment:  Procedure: POLYPECTOMY;  Surgeon: Midge Minium, MD;                Location: [facility] SUR[facility]ERY [facility];  Service: Endoscopy;                Laterality: N/A; 06/2014: REPLACEMENT TOTAL KNEE; Right No date: TONSILLECTOMY 1996: TOTAL VAGINAL HYSTERECTOMY     Comment:  cervical dysplasia BMI    Body Mass Index: 32.61 kg/m     Reproductive/Obstetrics negative OB ROS                             Anesthesia Physical Anesthesia Plan  ASA: 2  Anesthesia Plan: General   Post-op Pain Management:    Induction:   PONV Risk Score and Plan:   Airway Management Planned:   Additional Equipment:   Intra-op Plan:   Post-operative Plan:   Informed Consent: I have reviewed the patients History and Physical, chart, labs and discussed the procedure including the risks, benefits and alternatives for the proposed anesthesia with the patient or authorized representative who has indicated his/her understanding and acceptance.     Dental Advisory Given  Plan Discussed with: CRNA  Anesthesia Plan Comments:        Anesthesia Quick Evaluation

## 2023-04-28 NOTE — H&P (Signed)
Ashley Minium, MD [facility] [address], [address] [place], [place] [address] Phone:[phone] Fax : [phone]  Primary Care Physician:  Reubin Milan, MD Primary Gastroenterologist:  Dr. Servando Snare  Pre-Procedure History & Physical: HPI:  Ashley Glover is a 62 y.o. female is here for an endoscopy and colonoscopy.   Past Medical History:  Diagnosis Date   Acute gastritis without hemorrhage    Allergic rhinitis    Aortic root enlargement (HCC)    Benign neoplasm of ascending colon    Benign neoplasm of cecum    Degenerative joint disease    Distal radius fracture, left 08/24/2020   Fibromyalgia    GERD (gastroesophageal reflux disease)    Hx of abnormal cervical Pap smear 08/02/2015   1990's - had partial hysterectomy for dysplasia and HPV    Hyperlipidemia    Hypertension    Hypothyroid    Hypothyroidism (acquired)    Migraines    Mitral valve prolapse    states it is mild and does not see a cardiologist   Pre-diabetes    PVCs (premature ventricular contractions)    Redness of both eyes 09/30/2017   Sjogrens syndrome (HCC)     Past Surgical History:  Procedure Laterality Date   AUGMENTATION MAMMAPLASTY Bilateral 1987   COLONOSCOPY  05/2012   polyps   COLONOSCOPY WITH PROPOFOL N/A 06/20/2016   Procedure: COLONOSCOPY WITH PROPOFOL;  Surgeon: Ashley Minium, MD;  Location: [facility] SURGERY CNTR;  Service: Endoscopy;  Laterality: N/A;   COLONOSCOPY WITH PROPOFOL N/A 11/11/2019   Procedure: COLONOSCOPY WITH PROPOFOL;  Surgeon: Ashley Minium, MD;  Location: [facility] SURGERY CNTR;  Service: Endoscopy;  Laterality: N/A;  3   COLONOSCOPY WITH PROPOFOL N/A 03/06/2020   Procedure: COLONOSCOPY WITH BIOPSY;  Surgeon: Ashley Minium, MD;  Location: [facility] SURGERY CNTR;  Service: Endoscopy;  Laterality: N/A;   ESOPHAGOGASTRODUODENOSCOPY (EGD) WITH PROPOFOL N/A 06/20/2016   Procedure: ESOPHAGOGASTRODUODENOSCOPY (EGD) WITH PROPOFOL;  Surgeon: Ashley Minium, MD;  Location: [facility] SURGERY CN[facility];   Service: Endoscopy;  Laterality: N/A;   ESOPHAGOGASTRODUODENOSCOPY (EGD) WITH PROPOFOL N/A 11/11/2019   Procedure: ESOPHAGOGASTRODUODENOSCOPY (EGD) WITH PROPOFOL;  Surgeon: Ashley Minium, MD;  Location: [facility] SURGERY CNTR;  Service: Endoscopy;  Laterality: N/A;   ESOPHAGOGASTRODUODENOSCOPY (EGD) WITH PROPOFOL N/A 03/06/2020   Procedure: ESOPHAGOGASTRODUODENOSCOPY (EGD) WITH BIOPSY;  Surgeon: Ashley Minium, MD;  Location: [facility] SUR[facility];  Service: Endoscopy;  Laterality: N/A;   FOOT SURGERY Right    GANGLION CYST EXCISION Left    NASAL SINUS SURGERY     NEUROMA SURGERY Left    OPEN REDUCTION INTERNAL FIXATION (ORIF) DISTAL RADIAL FRACTURE Left 08/24/2020   Procedure: OPEN REDUCTION INTERNAL FIXATION (ORIF) DISTAL RADIAL FRACTURE;  Surgeon: Christena Flake, MD;  Location: [facility] ORS;  Service: Orthopedics;  Laterality: Left;   OPEN REDUCTION INTERNAL FIXATION (ORIF) DISTAL RADIAL FRACTURE Right 09/13/2022   Procedure: OPEN REDUCTION INTERNAL FIXATION (ORIF) DISTAL RADIUS FRACTURE;  Surgeon: Kennedy Bucker, MD;  Location: [facility] SURGERY CNTR;  Service: Orthopedics;  Laterality: Right;   PLACEMENT OF BREAST IMPLANTS     POLYPECTOMY  06/20/2016   Procedure: POLYPECTOMY;  Surgeon: Ashley Minium, MD;  Location: [facility];  Service: Endoscopy;;   POLYPECTOMY N/A 03/06/2020   Procedure: POLYPECTOMY;  Surgeon: Ashley Minium, MD;  Location: [facility];  Service: Endoscopy;  Laterality: N/A;   REPLACEMENT TOTAL KNEE Right 06/2014   TONSILLECTOMY     TOTAL VAGINAL HYSTERECTOMY  1996   cervical dysplasia    Prior to Admission medications  Medication Sig Start Date End Date Taking? Authorizing Provider  levothyroxine (SYNTHROID) 88 MCG tablet Take 1 tablet (88 mcg total) by mouth daily before breakfast. 02/13/23  Yes Reubin Milan, MD  propranolol (INDERAL) 40 MG tablet TAKE 1 TABLET BY MOUTH TWICE  DAILY 04/21/23  Yes Reubin Milan, MD  butalbital-apap-caffeine-codeine (FIORICET  WITH CODEINE) [phone] MG capsule TAKE 1 TO 2 CAPSULES BY MOUTH EVERY 6 HOURS AS NEEDED FOR  HEADACHE  OR  MIGRAINE 08/15/22   Reubin Milan, MD  Calcium Carb-Cholecalciferol (CALCIUM 600 + D PO) Take by mouth daily.    [provider]  cyclobenzaprine (FLEXERIL) 10 MG tablet Take 1 tablet (10 mg total) by mouth 3 (three) times daily as needed for muscle spasms. Patient not taking: Reported on 04/07/2023 04/01/23   Reubin Milan, MD  DULoxetine (CYMBALTA) 60 MG capsule TAKE 1 CAPSULE BY MOUTH DAILY 01/14/23   Reubin Milan, MD  furosemide (LASIX) 40 MG tablet TAKE 1 TABLET BY MOUTH DAILY 04/21/23   Reubin Milan, MD  gabapentin (NEURONTIN) 600 MG tablet TAKE 1 TABLET BY MOUTH 3 TIMES  DAILY 04/21/23   Reubin Milan, MD  hydrochlorothiazide (HYDRODIURIL) 12.5 MG tablet Take 12.5 mg by mouth daily. 09/18/21   [provider]  hydrocortisone 2.5 % cream APPLY TOPICALLY TO AFFECTED  AREA(S) TOPICALLY TWICE DAILY 02/10/23   Reubin Milan, MD  mometasone (ELOCON) 0.1 % lotion Apply topically daily. To both ears as needed 03/01/20   Reubin Milan, MD  Multiple Vitamin (MULTIVITAMIN ADULT PO) Take 1 tablet by mouth daily.    [provider]  Omega-3 Fatty Acids (FISH OIL PO) Take by mouth.    [provider]  pantoprazole (PROTONIX) 40 MG tablet TAKE 1 TABLET BY MOUTH TWICE  DAILY 01/14/23   Reubin Milan, MD  promethazine (PHENERGAN) 25 MG tablet TAKE 1 TABLET BY MOUTH EVERY 8 HOURS AS NEEDED FOR NAUSEA AND FOR VOMITING 01/07/23   Reubin Milan, MD  rosuvastatin (CRESTOR) 20 MG tablet Take 20 mg by mouth at bedtime. 12/06/19   [provider]  tiZANidine (ZANAFLEX) 4 MG tablet TAKE 1 TABLET BY MOUTH 3 TIMES  DAILY 02/10/23   Reubin Milan, MD  tobramycin-dexamethasone [facility]) ophthalmic solution Place 1 drop into both eyes every 6 (six) hours. Patient not taking: Reported on 04/07/2023 12/22/18   Reubin Milan, MD     Allergies as of 04/07/2023 - Review Complete 04/07/2023  Allergen Reaction Noted   Ace inhibitors Cough 11/21/2017   Ketamine  09/11/2022   Morphine and codeine Nausea Only 08/02/2015   Savella [milnacipran hcl] Palpitations 08/02/2015    Family History  Problem Relation Age of Onset   Pulmonary embolism Mother    Hypertension Mother    AVM Father    Hypertension Father    Hypothyroidism Father    Breast cancer Neg Hx     Social History   Socioeconomic History   Marital status: Divorced    Spouse name: Not on file   Number of children: 1   Years of education: Not on file   Highest education level: Not on file  Occupational History   Occupation: disabled  Tobacco Use   Smoking status: Never   Smokeless tobacco: Never  Vaping Use   Vaping status: Never Used  Substance and Sexual Activity   Alcohol use: No    Alcohol/week: 0.0 standard drinks of alcohol   Drug use: No  Sexual activity: Not on file  Other Topics Concern   Not on file  Social History Narrative   Patient is on Soc Sec Disability.  She has been disabled for a number of years from Degenerative arthritis and Fibromyalgia.   Social Determinants of Health   Financial Resource Strain: Low Risk  (06/21/2019)   Overall Financial Resource Strain (CARDIA)    Difficulty of Paying Living Expenses: Not hard at all  Food Insecurity: No Food Insecurity (02/06/2022)   Hunger Vital Sign    Worried About Running Out of Food in the Last Year: Never true    Ran Out of Food in the Last Year: Never true  Transportation Needs: No Transportation Needs (06/21/2019)   PRAPARE - Administrator, Civil Service (Medical): No    Lack of Transportation (Non-Medical): No  Physical Activity: Insufficiently Active (06/21/2019)   Exercise Vital Sign    Days of Exercise per Week: 3 days    Minutes of Exercise per Session: 30 min  Stress: No Stress Concern Present (06/21/2019)   [employer] of Occupational Health  - Occupational Stress Questionnaire    Feeling of Stress : Not at all  Social Connections: Unknown (02/06/2022)   Social Connection and Isolation Panel [NHANES]    Frequency of Communication with Friends and Family: More than three times a week    Frequency of Social Gatherings with Friends and Family: Once a week    Attends Religious Services: Patient declined    Database administrator or Organizations: Patient declined    Attends Banker Meetings: Patient declined    Marital Status: Divorced  Catering manager Violence: Not At Risk (02/06/2022)   Humiliation, Afraid, Rape, and Kick questionnaire    Fear of Current or Ex-Partner: No    Emotionally Abused: No    Physically Abused: No    Sexually Abused: No    Review of Systems: See HPI, otherwise negative ROS  Physical Exam: BP 135/82   Pulse (!) 59   Temp 98 F (36.7 C) (Temporal)   Resp 16   Ht 5\' 4"  (1.626 m)   Wt 86.2 kg   SpO2 94%   BMI 32.61 kg/m  General:   Alert,  pleasant and cooperative in NAD Head:  Normocephalic and atraumatic. Neck:  Supple; no masses or thyromegaly. Lungs:  Clear throughout to auscultation.    Heart:  Regular rate and rhythm. Abdomen:  Soft, nontender and nondistended. Normal bowel sounds, without guarding, and without rebound.   Neurologic:  Alert and  oriented x4;  grossly normal neurologically.  Impression/Plan: HIROKO FENTY is here for an endoscopy and colonoscopy to be performed for GERD a history of adenomatous polyps on 2021   Risks, benefits, limitations, and alternatives regarding  endoscopy and colonoscopy have been reviewed with the patient.  Questions have been answered.  All parties agreeable.   Ashley Minium, MD  04/28/2023, 7:41 AM

## 2023-04-28 NOTE — Op Note (Signed)
[facility] Gastroenterology Patient Name: Ashley Glover Procedure Date: 04/28/2023 7:32 AM MRN: [id number] Account #: [id number] Date of Birth: 07/02/1960 Admit Type: Outpatient Age: 62 Room: [facility] OR ROOM 01 Gender: Female Note Status: Finalized Instrument Name: [id number] Procedure:             Upper GI endoscopy Indications:           Heartburn Providers:             Midge Minium MD, MD Referring MD:          Bari Edward, MD (Referring MD) Medicines:             Propofol per Anesthesia Complications:         No immediate complications. Procedure:             Pre-Anesthesia Assessment:                        - Prior to the procedure, a History and Physical was                         performed, and patient medications and allergies were                         reviewed. The patient's tolerance of previous                         anesthesia was also reviewed. The risks and benefits                         of the procedure and the sedation options and risks                         were discussed with the patient. All questions were                         answered, and informed consent was obtained. Prior                         Anticoagulants: The patient has taken no anticoagulant                         or antiplatelet agents. ASA Grade Assessment: II - A                         patient with mild systemic disease. After reviewing                         the risks and benefits, the patient was deemed in                         satisfactory condition to undergo the procedure.                        After obtaining informed consent, the endoscope was                         passed under direct vision. Throughout the procedure,  the patient's blood pressure, pulse, and oxygen                         saturations were monitored continuously. The was                         introduced through the mouth, and advanced to the                          second part of duodenum. The upper GI endoscopy was                         accomplished without difficulty. The patient tolerated                         the procedure well. Findings:      The examined esophagus was normal.      A few sessile polyps with no stigmata of recent bleeding were found in       the gastric fundus. Biopsies were taken with a cold forceps for       histology.      The examined duodenum was normal. Impression:            - Normal esophagus.                        - A few gastric polyps. Biopsied.                        - Normal examined duodenum. Recommendation:        - Discharge patient to home.                        - Resume previous diet.                        - Continue present medications.                        - Await pathology results.                        - Perform a colonoscopy today. Procedure Code(s):     --- Professional ---                        [phone], Esophagogastroduodenoscopy, flexible,                         transoral; with biopsy, single or multiple Diagnosis Code(s):     --- Professional ---                        R12, Heartburn                        K31.7, Polyp of stomach and duodenum CPT copyright 2022 [employer]. All rights reserved. The codes documented in this report are preliminary and upon coder review may  be revised to meet current compliance requirements. Midge Minium MD, MD 04/28/2023 7:58:56 AM This report has been signed electronically. Number of Addenda: 0 Note Initiated On: 04/28/2023 7:32 AM Total Procedure Duration:  0 hours 3 minutes 0 seconds  Estimated Blood Loss:  Estimated blood loss: none.      [facility]

## 2023-04-29 ENCOUNTER — Encounter: Payer: Self-pay | Admitting: Gastroenterology

## 2023-04-30 LAB — SURGICAL PATHOLOGY

## 2023-05-02 ENCOUNTER — Encounter: Payer: Self-pay | Admitting: Gastroenterology

## 2023-05-05 ENCOUNTER — Other Ambulatory Visit: Payer: Self-pay | Admitting: Internal Medicine

## 2023-05-06 NOTE — Telephone Encounter (Signed)
Requested medication (s) are due for refill today: Yes  Requested medication (s) are on the active medication list: Yes  Last refill:  02/10/23  Future visit scheduled: Yes  Notes to clinic:  Unable to refill per protocol, medication not assigned to the refill protocol.      Requested Prescriptions  Pending Prescriptions Disp Refills   hydrocortisone 2.5 % cream [Pharmacy Med Name: HYDROCORTISONE  2.5%  CRE] 90 g 0    Sig: APPLY TOPICALLY TO AFFECTED  AREA(S) TWICE DAILY     Off-Protocol Failed - 05/05/2023  1:28 PM      Failed - Medication not assigned to a protocol, review manually.      Passed - Valid encounter within last 12 months    Recent Outpatient Visits           1 month ago Acute midline thoracic back pain   [place] [facility] at [facility], Nyoka Cowden, MD   4 months ago Annual physical exam   [facility] at [facility], Nyoka Cowden, MD   1 year ago Essential hypertension   [place] [facility] at [facility], Nyoka Cowden, MD   1 year ago Annual physical exam   [facility] at [facility], Nyoka Cowden, MD   2 years ago Essential hypertension   [facility] Medicine at [facility], Nyoka Cowden, MD       Future Appointments             In 1 month Judithann Graves, Nyoka Cowden, MD [facility] & [facility] at [facility], [facility]   In 8 months Willeen Niece, MD [facility]

## 2023-05-12 ENCOUNTER — Other Ambulatory Visit: Payer: Self-pay | Admitting: Internal Medicine

## 2023-05-12 DIAGNOSIS — G43009 Migraine without aura, not intractable, without status migrainosus: Secondary | ICD-10-CM

## 2023-05-13 NOTE — Telephone Encounter (Signed)
Requested medications are due for refill today.  yes  Requested medications are on the active medications list.  yes  Last refill. varied  Future visit scheduled.   yes  Notes to clinic.  Refills not delegated.    Requested Prescriptions  Pending Prescriptions Disp Refills   butalbital-apap-caffeine-codeine (FIORICET WITH CODEINE) 50-325-40-30 MG capsule [Pharmacy Med Name: Butalbital-APAP-Caff-Cod 50-325-40-30 MG Oral Capsule] 60 capsule 0    Sig: TAKE 1 TO 2 CAPSULES BY MOUTH EVERY 6 HOURS AS NEEDED FOR  HEADACHE  OR  MIGRAINE     Not Delegated - Analgesics:  Opioid Agonist Combinations 2 Failed - 05/13/2023 12:03 PM      Failed - This refill cannot be delegated      Failed - Urine Drug Screen completed in last 360 days      Failed - Valid encounter within last 3 months    Recent Outpatient Visits           1 month ago Acute midline thoracic back pain   [place] [facility] at [facility], Nyoka Cowden, MD   4 months ago Annual physical exam   [facility] Medicine at [facility], Nyoka Cowden, MD   1 year ago Essential hypertension   [place] [facility], Nyoka Cowden, MD   1 year ago Annual physical exam   [facility] at [facility], Nyoka Cowden, MD   2 years ago Essential hypertension    Primary Care & Sports Medicine at [facility], Nyoka Cowden, MD       Future Appointments             In 4 weeks Reubin Milan, MD [facility] Primary Care & Sports Medicine at [facility], [facility]   In 8 months Willeen Niece, MD [facility]            Passed - Cr in normal range and within 360 days    Creatinine, Ser  Date Value Ref Range Status  12/24/2022 0.78 0.57 - 1.00 mg/dL Final         Passed - eGFR is 10 or above and within 360 days    GFR calc Af Amer  Date Value Ref  Range Status  07/10/2020 76 >59 mL/min/1.73 Final    Comment:    **In accordance with recommendations from the NKF-ASN Task force,**   [facility] is in the process of updating its eGFR calculation to the   2021 CKD-EPI creatinine equation that estimates kidney function   without a race variable.    GFR, Estimated  Date Value Ref Range Status  08/24/2020 >60 >60 mL/min Final    Comment:    (NOTE) Calculated using the CKD-EPI Creatinine Equation (2021)    eGFR  Date Value Ref Range Status  12/24/2022 85 >59 mL/min/1.73 Final         Passed - Patient is not pregnant       promethazine (PHENERGAN) 25 MG tablet [Pharmacy Med Name: Promethazine HCl 25 MG Oral Tablet] 60 tablet 0    Sig: TAKE 1 TABLET BY MOUTH EVERY 8 HOURS AS NEEDED FOR NAUSEA AND FOR VOMITING     Not Delegated - Gastroenterology: Antiemetics Failed - 05/13/2023 12:03 PM      Failed - This refill cannot be delegated      Passed - Valid encounter within  last 6 months    Recent Outpatient Visits           1 month ago Acute midline thoracic back pain   [place] [facility] [place], Nyoka Cowden, MD   4 months ago Annual physical exam   [facility] at [facility], Nyoka Cowden, MD   1 year ago Essential hypertension   [place] [facility] at [facility], Nyoka Cowden, MD   1 year ago Annual physical exam   [facility] Primary [facility] & Sports Medicine at [facility], Nyoka Cowden, MD   2 years ago Essential hypertension   [facility] Medicine at [facility], Nyoka Cowden, MD       Future Appointments             In 4 weeks Judithann Graves Nyoka Cowden, MD [facility] Primary [facility] Medicine at [facility], [facility]   In 8 months Willeen Niece, MD [facility]

## 2023-05-13 NOTE — Telephone Encounter (Signed)
Please review.  KP

## 2023-05-14 ENCOUNTER — Ambulatory Visit: Payer: Medicare Other

## 2023-05-14 DIAGNOSIS — Z1231 Encounter for screening mammogram for malignant neoplasm of breast: Secondary | ICD-10-CM

## 2023-05-14 DIAGNOSIS — Z0000 Encounter for general adult medical examination without abnormal findings: Secondary | ICD-10-CM | POA: Diagnosis not present

## 2023-05-14 NOTE — Progress Notes (Signed)
Subjective:   Ashley Glover is a 62 y.o. female who presents for Medicare Annual (Subsequent) preventive examination.  Visit Complete: Virtual I connected with  Ashley Glover on 05/14/23 by a audio enabled telemedicine application and verified that I am speaking with the correct person using two identifiers.  Patient Location: Home  Provider Location: Office/Clinic  I discussed the limitations of evaluation and management by telemedicine. The patient expressed understanding and agreed to proceed.  Vital Signs: Because this visit was a virtual/telehealth visit, some criteria may be missing or patient reported. Any vitals not documented were not able to be obtained and vitals that have been documented are patient reported.  Cardiac Risk Factors include: advanced age (>59men, >52 women);dyslipidemia;hypertension;obesity (BMI >30kg/m2)     Objective:    Today's Vitals   05/14/23 0903  PainSc: 5    There is no height or weight on file to calculate BMI.     05/14/2023    9:07 AM 04/28/2023    7:08 AM 04/07/2023   10:22 AM 09/13/2022   12:01 PM 09/13/2022   11:46 AM 09/03/2022    2:34 PM 02/06/2022    3:07 PM  Advanced Directives  Does Patient Have a Medical Advance Directive? No No No No No No No  Would patient like information on creating a medical advance directive? No - Patient declined  No - Patient declined Yes (MAU/Ambulatory/Procedural Areas - Information given) No - Patient declined No - Patient declined No - Patient declined    Current Medications (verified) Outpatient Encounter Medications as of 05/14/2023  Medication Sig   butalbital-apap-caffeine-codeine (FIORICET WITH CODEINE) 50-325-40-30 MG capsule TAKE 1 TO 2 CAPSULES BY MOUTH EVERY 6 HOURS AS NEEDED FOR  HEADACHE  OR  MIGRAINE   Calcium Carb-Cholecalciferol (CALCIUM 600 + D PO) Take by mouth daily.   cyclobenzaprine (FLEXERIL) 10 MG tablet Take 1 tablet (10 mg total) by mouth 3 (three) times daily as needed for  muscle spasms.   DULoxetine (CYMBALTA) 60 MG capsule TAKE 1 CAPSULE BY MOUTH DAILY   furosemide (LASIX) 40 MG tablet TAKE 1 TABLET BY MOUTH DAILY   gabapentin (NEURONTIN) 600 MG tablet TAKE 1 TABLET BY MOUTH 3 TIMES  DAILY   hydrochlorothiazide (HYDRODIURIL) 12.5 MG tablet Take 12.5 mg by mouth daily.   hydrocortisone 2.5 % cream APPLY TOPICALLY TO AFFECTED  AREA(S) TWICE DAILY   levothyroxine (SYNTHROID) 88 MCG tablet Take 1 tablet (88 mcg total) by mouth daily before breakfast.   mometasone (ELOCON) 0.1 % lotion Apply topically daily. To both ears as needed   Multiple Vitamin (MULTIVITAMIN ADULT PO) Take 1 tablet by mouth daily.   Omega-3 Fatty Acids (FISH OIL PO) Take by mouth.   pantoprazole (PROTONIX) 40 MG tablet TAKE 1 TABLET BY MOUTH TWICE  DAILY   promethazine (PHENERGAN) 25 MG tablet TAKE 1 TABLET BY MOUTH EVERY 8 HOURS AS NEEDED FOR NAUSEA AND FOR VOMITING   propranolol (INDERAL) 40 MG tablet TAKE 1 TABLET BY MOUTH TWICE  DAILY   rosuvastatin (CRESTOR) 20 MG tablet Take 20 mg by mouth at bedtime.   tiZANidine (ZANAFLEX) 4 MG tablet TAKE 1 TABLET BY MOUTH 3 TIMES  DAILY   tobramycin-dexamethasone (TOBRADEX) ophthalmic solution Place 1 drop into both eyes every 6 (six) hours.   No facility-administered encounter medications on file as of 05/14/2023.    Allergies (verified) Ace inhibitors, Ketamine, Morphine and codeine, and Savella [milnacipran hcl]   History: Past Medical History:  Diagnosis Date  Acute gastritis without hemorrhage    Allergic rhinitis    Aortic root enlargement (HCC)    Benign neoplasm of ascending colon    Benign neoplasm of cecum    Degenerative joint disease    Distal radius fracture, left 08/24/2020   Fibromyalgia    GERD (gastroesophageal reflux disease)    Hx of abnormal cervical Pap smear 08/02/2015   1990's - had partial hysterectomy for dysplasia and HPV    Hyperlipidemia    Hypertension    Hypothyroid    Hypothyroidism (acquired)     Migraines    Mitral valve prolapse    states it is mild and does not see a cardiologist   Pre-diabetes    PVCs (premature ventricular contractions)    Redness of both eyes 09/30/2017   Sjogrens syndrome (HCC)    Past Surgical History:  Procedure Laterality Date   AUGMENTATION MAMMAPLASTY Bilateral 1987   COLONOSCOPY  05/2012   polyps   COLONOSCOPY WITH PROPOFOL N/A 06/20/2016   Procedure: COLONOSCOPY WITH PROPOFOL;  Surgeon: Midge Minium, MD;  Location: [facility];  Service: Endoscopy;  Laterality: N/A;   COLONOSCOPY WITH PROPOFOL N/A 11/11/2019   Procedure: COLONOSCOPY WITH PROPOFOL;  Surgeon: Midge Minium, MD;  Location: [facility];  Service: Endoscopy;  Laterality: N/A;  3   COLONOSCOPY WITH PROPOFOL N/A 03/06/2020   Procedure: COLONOSCOPY WITH BIOPSY;  Surgeon: Midge Minium, MD;  Location: [facility];  Service: Endoscopy;  Laterality: N/A;   COLONOSCOPY WITH PROPOFOL N/A 04/28/2023   Procedure: COLONOSCOPY WITH BIOPSY;  Surgeon: Midge Minium, MD;  Location: [facility];  Service: Endoscopy;  Laterality: N/A;   ESOPHAGOGASTRODUODENOSCOPY (EGD) WITH PROPOFOL N/A 06/20/2016   Procedure: ESOPHAGOGASTRODUODENOSCOPY (EGD) WITH PROPOFOL;  Surgeon: Midge Minium, MD;  Location: [facility];  Service: Endoscopy;  Laterality: N/A;   ESOPHAGOGASTRODUODENOSCOPY (EGD) WITH PROPOFOL N/A 11/11/2019   Procedure: ESOPHAGOGASTRODUODENOSCOPY (EGD) WITH PROPOFOL;  Surgeon: Midge Minium, MD;  Location: [facility];  Service: Endoscopy;  Laterality: N/A;   ESOPHAGOGASTRODUODENOSCOPY (EGD) WITH PROPOFOL N/A 03/06/2020   Procedure: ESOPHAGOGASTRODUODENOSCOPY (EGD) WITH BIOPSY;  Surgeon: Midge Minium, MD;  Location: [facility];  Service: Endoscopy;  Laterality: N/A;   ESOPHAGOGASTRODUODENOSCOPY (EGD) WITH PROPOFOL N/A 04/28/2023   Procedure: ESOPHAGOGASTRODUODENOSCOPY (EGD) WITH BIOPSY;  Surgeon: Midge Minium, MD;  Location: [facility];  Service:  Endoscopy;  Laterality: N/A;   FOOT SURGERY Right    GANGLION CYST EXCISION Left    NASAL SINUS SURGERY     NEUROMA SURGERY Left    OPEN REDUCTION INTERNAL FIXATION (ORIF) DISTAL RADIAL FRACTURE Left 08/24/2020   Procedure: OPEN REDUCTION INTERNAL FIXATION (ORIF) DISTAL RADIAL FRACTURE;  Surgeon: Christena Flake, MD;  Location: [facility] ORS;  Service: Orthopedics;  Laterality: Left;   OPEN REDUCTION INTERNAL FIXATION (ORIF) DISTAL RADIAL FRACTURE Right 09/13/2022   Procedure: OPEN REDUCTION INTERNAL FIXATION (ORIF) DISTAL RADIUS FRACTURE;  Surgeon: Kennedy Bucker, MD;  Location: [facility];  Service: Orthopedics;  Laterality: Right;   PLACEMENT OF BREAST IMPLANTS     POLYPECTOMY  06/20/2016   Procedure: POLYPECTOMY;  Surgeon: Midge Minium, MD;  Location: [facility];  Service: Endoscopy;;   POLYPECTOMY N/A 03/06/2020   Procedure: POLYPECTOMY;  Surgeon: Midge Minium, MD;  Location: [facility];  Service: Endoscopy;  Laterality: N/A;   POLYPECTOMY  04/28/2023   Procedure: POLYPECTOMY;  Surgeon: Midge Minium, MD;  Location: [facility];  Service: Endoscopy;;   REPLACEMENT TOTAL KNEE Right 06/2014   TONSILLECTOMY  TOTAL VAGINAL HYSTERECTOMY  1996   cervical dysplasia   Family History  Problem Relation Age of Onset   Pulmonary embolism Mother    Hypertension Mother    AVM Father    Hypertension Father    Hypothyroidism Father    Breast cancer Neg Hx    Social History   Socioeconomic History   Marital status: Divorced    Spouse name: Not on file   Number of children: 1   Years of education: Not on file   Highest education level: Not on file  Occupational History   Occupation: disabled  Tobacco Use   Smoking status: Never   Smokeless tobacco: Never  Vaping Use   Vaping status: Never Used  Substance and Sexual Activity   Alcohol use: No    Alcohol/week: 0.0 standard drinks of alcohol   Drug use: No   Sexual activity: Not on file  Other Topics  Concern   Not on file  Social History Narrative   Patient is on Soc Sec Disability.  She has been disabled for a number of years from Degenerative arthritis and Fibromyalgia.   Social Drivers of Corporate investment banker Strain: Low Risk  (05/14/2023)   Overall Financial Resource Strain (CARDIA)    Difficulty of Paying Living Expenses: Not hard at all  Food Insecurity: No Food Insecurity (05/14/2023)   Hunger Vital Sign    Worried About Running Out of Food in the Last Year: Never true    Ran Out of Food in the Last Year: Never true  Transportation Needs: No Transportation Needs (05/14/2023)   PRAPARE - Administrator, Civil Service (Medical): No    Lack of Transportation (Non-Medical): No  Physical Activity: Insufficiently Active (05/14/2023)   Exercise Vital Sign    Days of Exercise per Week: 3 days    Minutes of Exercise per Session: 30 min  Stress: No Stress Concern Present (05/14/2023)   [employer] of Occupational Health - Occupational Stress Questionnaire    Feeling of Stress : Only a little  Social Connections: Socially Isolated (05/14/2023)   Social Connection and Isolation Panel [NHANES]    Frequency of Communication with Friends and Family: More than three times a week    Frequency of Social Gatherings with Friends and Family: Once a week    Attends Religious Services: Never    Database administrator or Organizations: No    Attends Engineer, structural: Never    Marital Status: Divorced    Tobacco Counseling Counseling given: Not Answered   Clinical Intake:  Pre-visit preparation completed: Yes  Pain : 0-10 Pain Score: 5  Pain Type: Chronic pain Pain Location: Head Pain Descriptors / Indicators: Aching, Pounding Pain Onset: More than a month ago Pain Frequency: Intermittent     BMI - recorded: 33 Nutritional Status: BMI > 30  Obese Diabetes: No  How often do you need to have someone help you when you read instructions,  pamphlets, or other written materials from your doctor or pharmacy?: 1 - Never  Interpreter Needed?: No  Information entered by :: Kennedy Bucker, LPN   Activities of Daily Living    05/14/2023    9:09 AM 04/07/2023   10:07 AM  In your present state of health, do you have any difficulty performing the following activities:  Hearing? 0 0  Vision? 0 0  Difficulty concentrating or making decisions? 0 0  Walking or climbing stairs? 1   Comment ARTHRITIS  Dressing or bathing? 0   Doing errands, shopping? 0   Preparing Food and eating ? N   Using the Toilet? N   In the past six months, have you accidently leaked urine? N   Do you have problems with loss of bowel control? N   Managing your Medications? N   Managing your Finances? N   Housekeeping or managing your Housekeeping? N     Patient Care Team: Reubin Milan, MD as PCP - General (Internal Medicine) Chauncey Reading, MD as Referring Physician (Orthopedic Surgery) Lavena Stanford, MD as Referring Physician (Orthopedic Surgery) Midge Minium, MD as Consulting Physician (Gastroenterology) Adriana Reams, DO as Referring Physician (Interventional Cardiology) Nevada Crane, MD as Consulting Physician (Ophthalmology)  Indicate any recent Medical Services you may have received from other than [facility] providers in the past year (date may be approximate).     Assessment:   This is a routine wellness examination for Ashley Glover.  Hearing/Vision screen Hearing Screening - Comments:: NO AIDS Vision Screening - Comments:: READERS- DR.KING   Goals Addressed             This Visit's Progress    DIET - EAT MORE FRUITS AND VEGETABLES         Depression Screen    05/14/2023    9:06 AM 04/01/2023   10:23 AM 12/24/2022    9:39 AM 03/01/2022    9:51 AM 02/06/2022    3:07 PM 09/19/2021    9:29 AM 12/13/2020    9:04 AM  PHQ 2/9 Scores  PHQ - 2 Score 0 0 0 0 0 0 0  PHQ- 9 Score 0 0 0 0 2 4 2     Fall Risk    05/14/2023     9:08 AM 04/01/2023   10:22 AM 12/24/2022    9:39 AM 03/01/2022    9:51 AM 02/06/2022    3:07 PM  Fall Risk   Falls in the past year? 1 1 0 0 0  Number falls in past yr: 0 0 0 0 0  Injury with Fall? 1 0 0 0 0  Risk for fall due to : History of fall(s) No Fall Risks No Fall Risks No Fall Risks History of fall(s)  Follow up Falls evaluation completed;Falls prevention discussed Falls evaluation completed Falls evaluation completed Falls evaluation completed Falls evaluation completed    MEDICARE RISK AT HOME: Medicare Risk at Home Any stairs in or around the home?: Yes If so, are there any without handrails?: No Home free of loose throw rugs in walkways, pet beds, electrical cords, etc?: Yes Adequate lighting in your home to reduce risk of falls?: Yes Life alert?: No Use of a cane, walker or w/c?: No Grab bars in the bathroom?: Yes Shower chair or bench in shower?: Yes Elevated toilet seat or a handicapped toilet?: No  TIMED UP AND GO:  Was the test performed?  No    Cognitive Function:        05/14/2023    9:11 AM 02/06/2022    3:08 PM 06/21/2019   10:26 AM  6CIT Screen  What Year? 0 points 0 points 0 points  What month? 0 points 0 points 0 points  What time? 0 points 0 points 0 points  Count back from 20 0 points 0 points 0 points  Months in reverse 0 points 0 points 0 points  Repeat phrase 0 points 0 points 0 points  Total Score 0 points 0 points 0 points  Immunizations Immunization History  Administered Date(s) Administered   Influenza,inj,Quad PF,6+ Mos 04/24/2016, 03/04/2017, 03/11/2018   Influenza-Unspecified 03/28/2019   Tdap 09/19/2021   Zoster Recombinant(Shingrix) 08/03/2019, 11/05/2019    TDAP status: Up to date  Flu Vaccine status: Declined, Education has been provided regarding the importance of this vaccine but patient still declined. Advised may receive this vaccine at local pharmacy or Health Dept. Aware to provide a copy of the vaccination record if  obtained from local pharmacy or Health Dept. Verbalized acceptance and understanding.  Pneumococcal vaccine status: Declined,  Education has been provided regarding the importance of this vaccine but patient still declined. Advised may receive this vaccine at local pharmacy or Health Dept. Aware to provide a copy of the vaccination record if obtained from local pharmacy or Health Dept. Verbalized acceptance and understanding.   Covid-19 vaccine status: Declined, Education has been provided regarding the importance of this vaccine but patient still declined. Advised may receive this vaccine at local pharmacy or Health Dept.or vaccine clinic. Aware to provide a copy of the vaccination record if obtained from local pharmacy or Health Dept. Verbalized acceptance and understanding.  Qualifies for Shingles Vaccine? Yes   Zostavax completed No   Shingrix Completed?: Yes  Screening Tests Health Maintenance  Topic Date Due   COVID-19 Vaccine (1) Never done   HIV Screening  Never done   INFLUENZA VACCINE  08/25/2023 (Originally 12/26/2022)   MAMMOGRAM  07/23/2023   Medicare Annual Wellness (AWV)  05/13/2024   Colonoscopy  04/27/2028   DTaP/Tdap/Td (2 - Td or Tdap) 09/20/2031   Hepatitis C Screening  Completed   Zoster Vaccines- Shingrix  Completed   HPV VACCINES  Aged Out    Health Maintenance  Health Maintenance Due  Topic Date Due   COVID-19 Vaccine (1) Never done   HIV Screening  Never done    Colorectal cancer screening: Type of screening: Colonoscopy. Completed 04/28/23. Repeat every 5 years  Mammogram status: Completed 07/22/22. Repeat every year- referral sent for Feb 2025   Lung Cancer Screening: (Low Dose CT Chest recommended if Age 48-80 years, 20 pack-year currently smoking OR have quit w/in 15years.) does not qualify.    Additional Screening:  Hepatitis C Screening: does qualify; Completed 05/21/17  Vision Screening: Recommended annual ophthalmology exams for early  detection of glaucoma and other disorders of the eye. Is the patient up to date with their annual eye exam?  Yes  Who is the provider or what is the name of the office in which the patient attends annual eye exams? DR.Brooke Dare If pt is not established with a provider, would they like to be referred to a provider to establish care? No .   Dental Screening: Recommended annual dental exams for proper oral hygiene    Community Resource Referral / Chronic Care Management: CRR required this visit?  No   CCM required this visit?  No     Plan:     I have personally reviewed and noted the following in the patient's chart:   Medical and social history Use of alcohol, tobacco or illicit drugs  Current medications and supplements including opioid prescriptions. Patient is currently taking opioid prescriptions. Information provided to patient regarding non-opioid alternatives. Patient advised to discuss non-opioid treatment plan with their provider. Functional ability and status Nutritional status Physical activity Advanced directives List of other physicians Hospitalizations, surgeries, and ER visits in previous 12 months Vitals Screenings to include cognitive, depression, and falls Referrals and appointments  In addition, I  have reviewed and discussed with patient certain preventive protocols, quality metrics, and best practice recommendations. A written personalized care plan for preventive services as well as general preventive health recommendations were provided to patient.     Hal Hope, LPN   13/24/4010   After Visit Summary: (MyChart) Due to this being a telephonic visit, the after visit summary with patients personalized plan was offered to patient via MyChart   Nurse Notes: NONE

## 2023-05-14 NOTE — Patient Instructions (Addendum)
Ashley Glover , Thank you for taking time to come for your Medicare Wellness Visit. I appreciate your ongoing commitment to your health goals. Please review the following plan we discussed and let me know if I can assist you in the future.   Referrals/Orders/Follow-Ups/Clinician Recommendations: REFERRAL SENT FOR MAMMOGRAM IN FEB 2025  You have an order for:  []   2D Mammogram  [x]   3D Mammogram  []   Bone Density     Please call for appointment:  [facility]  [address] [place] [address] [phone]  [facility] [address] [place], [place] [address] [phone]  [facility] [address] Geanie Logan [place], [place] [address] [phone]    Make sure to wear two-piece clothing.  No lotions, powders, or deodorants the day of the appointment. Make sure to bring picture ID and insurance card.  Bring list of medications you are currently taking including any supplements.   Schedule your [place] screening mammogram through MyChart!   Log into your MyChart account.  Go to 'Visit' (or 'Appointments' if on mobile App) --> Schedule an Appointment  Under 'Select a Reason for Visit' choose the Mammogram Screening option.  Complete the pre-visit questions and select the time and place that best fits your schedule.    This is a list of the screening recommended for you and due dates:  Health Maintenance  Topic Date Due   COVID-19 Vaccine (1) Never done   HIV Screening  Never done   Flu Shot  08/25/2023*   Mammogram  07/23/2023   Medicare Annual Wellness Visit  05/13/2024   Colon Cancer Screening  04/27/2028   DTaP/Tdap/Td vaccine (2 - Td or Tdap) 09/20/2031   Hepatitis C Screening  Completed   Zoster (Shingles) Vaccine  Completed   HPV Vaccine  Aged Out  *Topic was postponed. The date shown is not the original due date.    Advanced  directives: (ACP Link)Information on Advanced Care Planning can be found at [facility] Advance Health Care Directives Advance Health Care Directives ([email or web address]   Next Medicare Annual Wellness Visit scheduled for next year: Yes   05/26/24 @ 8:50 AM IN PERSON

## 2023-05-19 ENCOUNTER — Telehealth: Payer: Self-pay | Admitting: Internal Medicine

## 2023-05-19 ENCOUNTER — Other Ambulatory Visit: Payer: Self-pay | Admitting: Internal Medicine

## 2023-05-19 DIAGNOSIS — G43009 Migraine without aura, not intractable, without status migrainosus: Secondary | ICD-10-CM

## 2023-05-19 MED ORDER — BUTALBITAL-APAP-CAFF-COD 50-325-40-30 MG PO CAPS: 1.0000 | ORAL_CAPSULE | Freq: Four times a day (QID) | ORAL | 2 refills | Status: DC | PRN

## 2023-05-19 NOTE — Telephone Encounter (Signed)
Called pt and updated her on the Rx changes Dr Judithann Graves made. Reassured pt if there were anymore issues with Rx, to message [place] or give [place] a call back.

## 2023-05-19 NOTE — Telephone Encounter (Signed)
butalbital-apap-caffeine-codeine (FIORICET WITH CODEINE) 50-325-40-30 MG capsule   Patient Ashley Glover states that the price of the medication increased because of the change in dosage.  She would like it changed back to the prior dosage   " Now it says one capsule" before is said "one or two capsule"     Please advise

## 2023-05-23 DIAGNOSIS — M5126 Other intervertebral disc displacement, lumbar region: Secondary | ICD-10-CM | POA: Diagnosis not present

## 2023-05-23 DIAGNOSIS — S32019A Unspecified fracture of first lumbar vertebra, initial encounter for closed fracture: Secondary | ICD-10-CM | POA: Diagnosis not present

## 2023-05-23 DIAGNOSIS — M545 Low back pain, unspecified: Secondary | ICD-10-CM | POA: Diagnosis not present

## 2023-05-23 DIAGNOSIS — M4316 Spondylolisthesis, lumbar region: Secondary | ICD-10-CM | POA: Diagnosis not present

## 2023-05-23 DIAGNOSIS — M419 Scoliosis, unspecified: Secondary | ICD-10-CM | POA: Diagnosis not present

## 2023-05-23 DIAGNOSIS — Z885 Allergy status to narcotic agent status: Secondary | ICD-10-CM | POA: Diagnosis not present

## 2023-05-23 DIAGNOSIS — S32018A Other fracture of first lumbar vertebra, initial encounter for closed fracture: Secondary | ICD-10-CM | POA: Diagnosis not present

## 2023-05-23 DIAGNOSIS — M47816 Spondylosis without myelopathy or radiculopathy, lumbar region: Secondary | ICD-10-CM | POA: Diagnosis not present

## 2023-05-23 DIAGNOSIS — G8929 Other chronic pain: Secondary | ICD-10-CM | POA: Diagnosis not present

## 2023-05-24 DIAGNOSIS — S32019A Unspecified fracture of first lumbar vertebra, initial encounter for closed fracture: Secondary | ICD-10-CM | POA: Diagnosis not present

## 2023-06-10 ENCOUNTER — Ambulatory Visit: Payer: Medicare Other | Admitting: Internal Medicine

## 2023-06-26 ENCOUNTER — Ambulatory Visit: Payer: Medicare Other | Admitting: Dermatology

## 2023-07-13 ENCOUNTER — Other Ambulatory Visit: Payer: Self-pay | Admitting: Internal Medicine

## 2023-07-13 DIAGNOSIS — E034 Atrophy of thyroid (acquired): Secondary | ICD-10-CM

## 2023-07-14 NOTE — Telephone Encounter (Signed)
LOV 04/01/2023.  All criteria met.  Requested Prescriptions  Pending Prescriptions Disp Refills   DULoxetine (CYMBALTA) 60 MG capsule [Pharmacy Med Name: DULoxetine HCl 60 MG Oral Capsule Delayed Release Particles] 100 capsule 0    Sig: TAKE 1 CAPSULE BY MOUTH DAILY     Psychiatry: Antidepressants - SNRI - duloxetine Failed - 07/14/2023  3:12 PM      Failed - Valid encounter within last 6 months    Recent Outpatient Visits           3 months ago Acute midline thoracic back pain   [place] [facility], Nyoka Cowden, MD   6 months ago Annual physical exam   [facility] at [facility] Nyoka Cowden, MD   1 year ago Essential hypertension   [place] [facility] at [facility], Nyoka Cowden, MD   1 year ago Annual physical exam   [facility] at [facility], Nyoka Cowden, MD   2 years ago Essential hypertension   [place] [facility] at [facility], Nyoka Cowden, MD       Future Appointments             In 1 month Judithann Graves Nyoka Cowden, MD [facility] Medicine at [facility], [facility]   In 6 months Willeen Niece, MD [facility]            Passed - Cr in normal range and within 360 days    Creatinine, Ser  Date Value Ref Range Status  12/24/2022 0.78 0.57 - 1.00 mg/dL Final         Passed - eGFR is 30 or above and within 360 days    GFR calc Af Amer  Date Value Ref Range Status  07/10/2020 76 >59 mL/min/1.73 Final    Comment:    **In accordance with recommendations from the NKF-ASN Task force,**   [facility] is in the process of updating its eGFR calculation to the   2021 CKD-EPI creatinine equation that estimates kidney function   without a race variable.    GFR, Estimated  Date Value Ref Range Status  08/24/2020 >60 >60 mL/min Final    Comment:     (NOTE) Calculated using the CKD-EPI Creatinine Equation (2021)    eGFR  Date Value Ref Range Status  12/24/2022 85 >59 mL/min/1.73 Final         Passed - Completed PHQ-2 or PHQ-9 in the last 360 days      Passed - Last BP in normal range    BP Readings from Last 1 Encounters:  04/28/23 100/70          levothyroxine (SYNTHROID) 88 MCG tablet [Pharmacy Med Name: Levothyroxine Sodium 88 MCG Oral Tablet] 100 tablet 0    Sig: TAKE 1 TABLET BY MOUTH DAILY  BEFORE BREAKFAST     Endocrinology:  Hypothyroid Agents Passed - 07/14/2023  3:12 PM      Passed - TSH in normal range and within 360 days    TSH  Date Value Ref Range Status  12/24/2022 3.380 0.450 - 4.500 uIU/mL Final         Passed - Valid encounter within last 12 months    Recent Outpatient Visits           3 months ago Acute midline thoracic  back pain   [place] [facility] at [facility], Nyoka Cowden, MD   6 months ago Annual physical exam   [facility] at [facility], Nyoka Cowden, MD   1 year ago Essential hypertension   [place] [facility] at [facility], Nyoka Cowden, MD   1 year ago Annual physical exam   [facility] at [facility], Nyoka Cowden, MD   2 years ago Essential hypertension   [facility] at [facility], Nyoka Cowden, MD       Future Appointments             In 1 month Judithann Graves, Nyoka Cowden, MD [facility] Medicine at [facility], [facility]   In 6 months Willeen Niece, MD [facility]

## 2023-07-23 DIAGNOSIS — S32010A Wedge compression fracture of first lumbar vertebra, initial encounter for closed fracture: Secondary | ICD-10-CM | POA: Diagnosis not present

## 2023-07-29 ENCOUNTER — Telehealth: Payer: Self-pay | Admitting: Gastroenterology

## 2023-07-29 NOTE — Telephone Encounter (Signed)
 The called in and left a voicemail requesting to her results. I call her to let her know that I receive the patient message, and I sent the message to the nurse.

## 2023-07-30 NOTE — Telephone Encounter (Signed)
 Left message on voicemail.

## 2023-07-30 NOTE — Telephone Encounter (Signed)
 Pt states that she never received letter with results from 04/2023 and she has not accessed her Mychart... Results given and letter mailed to pt again, verified address

## 2023-08-08 ENCOUNTER — Other Ambulatory Visit: Payer: Self-pay | Admitting: Internal Medicine

## 2023-08-08 DIAGNOSIS — M797 Fibromyalgia: Secondary | ICD-10-CM

## 2023-08-11 NOTE — Telephone Encounter (Signed)
 Requested medication (s) are due for refill today - yes  Requested medication (s) are on the active medication list -yes  Future visit scheduled -yes  Last refill: 02/10/23 #300 1RF  Notes to clinic: non delegated Rx   Requested Prescriptions  Pending Prescriptions Disp Refills   tiZANidine (ZANAFLEX) 4 MG tablet [Pharmacy Med Name: tiZANidine HCl 4 MG Oral Tablet] 300 tablet 1    Sig: TAKE 1 TABLET BY MOUTH 3 TIMES  DAILY     Not Delegated - Cardiovascular:  Alpha-2 Agonists - tizanidine Failed - 08/11/2023 10:36 AM      Failed - This refill cannot be delegated      Failed - Valid encounter within last 6 months    Recent Outpatient Visits           4 months ago Acute midline thoracic back pain   [place] [facility] at [facility], Nyoka Cowden, MD   7 months ago Annual physical exam   [facility] at [facility], Nyoka Cowden, MD   1 year ago Essential hypertension   [place] [facility] at [facility], Nyoka Cowden, MD   1 year ago Annual physical exam   [facility] Medicine at [facility], Nyoka Cowden, MD   2 years ago Essential hypertension   [place] [facility] at [facility], Nyoka Cowden, MD       Future Appointments             In 1 week Judithann Graves Nyoka Cowden, MD [facility] Medicine at [facility], [facility]   In 5 months Willeen Niece, MD [facility]               Requested Prescriptions  Pending Prescriptions Disp Refills   tiZANidine (ZANAFLEX) 4 MG tablet [Pharmacy Med Name: tiZANidine HCl 4 MG Oral Tablet] 300 tablet 1    Sig: TAKE 1 TABLET BY MOUTH 3 TIMES  DAILY     Not Delegated - Cardiovascular:  Alpha-2 Agonists - tizanidine Failed - 08/11/2023 10:36 AM      Failed - This refill cannot be delegated      Failed - Valid encounter within last 6  months    Recent Outpatient Visits           4 months ago Acute midline thoracic back pain   [place] [facility] at [facility], Nyoka Cowden, MD   7 months ago Annual physical exam   [facility] Sports Medicine at [facility], Nyoka Cowden, MD   1 year ago Essential hypertension   [place] [facility] at [place], Nyoka Cowden, MD   1 year ago Annual physical exam   [facility] Medicine at [facility], Nyoka Cowden, MD   2 years ago Essential hypertension   [facility] at [facility], Nyoka Cowden, MD       Future Appointments             In 1 week Judithann Graves, Nyoka Cowden, MD [facility] Primary Care & Sports Medicine at [facility], [facility]   In 5 months Willeen Niece, MD [facility]

## 2023-08-18 ENCOUNTER — Telehealth: Payer: Self-pay

## 2023-08-18 ENCOUNTER — Encounter: Payer: Self-pay | Admitting: Internal Medicine

## 2023-08-18 ENCOUNTER — Ambulatory Visit (INDEPENDENT_AMBULATORY_CARE_PROVIDER_SITE_OTHER): Payer: Medicare Other | Admitting: Internal Medicine

## 2023-08-18 VITALS — BP 112/70 | HR 64 | Ht 64.0 in | Wt 182.0 lb

## 2023-08-18 DIAGNOSIS — M8088XD Other osteoporosis with current pathological fracture, vertebra(e), subsequent encounter for fracture with routine healing: Secondary | ICD-10-CM | POA: Diagnosis not present

## 2023-08-18 DIAGNOSIS — I10 Essential (primary) hypertension: Secondary | ICD-10-CM | POA: Diagnosis not present

## 2023-08-18 DIAGNOSIS — M797 Fibromyalgia: Secondary | ICD-10-CM

## 2023-08-18 DIAGNOSIS — G43009 Migraine without aura, not intractable, without status migrainosus: Secondary | ICD-10-CM

## 2023-08-18 DIAGNOSIS — M8580 Other specified disorders of bone density and structure, unspecified site: Secondary | ICD-10-CM

## 2023-08-18 MED ORDER — ALENDRONATE SODIUM 70 MG PO TABS: 70.0000 mg | ORAL_TABLET | ORAL | 3 refills | Status: DC

## 2023-08-18 MED ORDER — BUTALBITAL-APAP-CAFF-COD 50-325-40-30 MG PO CAPS: 1.0000 | ORAL_CAPSULE | Freq: Four times a day (QID) | ORAL | 2 refills | Status: DC | PRN

## 2023-08-18 MED ORDER — TIZANIDINE HCL 4 MG PO TABS: 4.0000 mg | ORAL_TABLET | Freq: Three times a day (TID) | ORAL | 0 refills | Status: DC

## 2023-08-18 MED ORDER — PROMETHAZINE HCL 25 MG PO TABS: 25.0000 mg | ORAL_TABLET | Freq: Three times a day (TID) | ORAL | 0 refills | Status: DC | PRN

## 2023-08-18 NOTE — Assessment & Plan Note (Signed)
 Stable on gabapentin and tizanidine

## 2023-08-18 NOTE — Assessment & Plan Note (Signed)
 Recent vertebral compression fracture - healing Wrist fractures in the past few years. Will start Fosamax.

## 2023-08-18 NOTE — Assessment & Plan Note (Signed)
 No recent change in migraine headaches. Headaches respond well to current therapy with fioricet with codeine. Will continue regimen;  follow up if worsening.

## 2023-08-18 NOTE — Progress Notes (Signed)
 Date:  08/18/2023   Name:  Ashley Glover   DOB:  [birth year]   MRN:  [id number]   Chief Complaint: Hypertension  Hypertension This is a chronic problem. The problem is controlled. Associated symptoms include headaches. Pertinent negatives include no chest pain, palpitations or shortness of breath.  Migraine  This is a recurrent problem. The problem occurs intermittently. The problem has been unchanged. The pain quality is similar to prior headaches. Associated symptoms include back pain. Pertinent negatives include no abdominal pain, coughing, dizziness or weakness. Her past medical history is significant for hypertension.  Back Pain This is a new problem. The current episode started more than 1 month ago. The problem has been gradually improving since onset. The pain is present in the lumbar spine (compression fracture after a fall). The pain is moderate. Associated symptoms include headaches. Pertinent negatives include no abdominal pain, chest pain or weakness.    Review of Systems  Constitutional:  Negative for fatigue and unexpected weight change.  HENT:  Negative for trouble swallowing.   Eyes:  Negative for visual disturbance.  Respiratory:  Negative for cough, chest tightness, shortness of breath and wheezing.   Cardiovascular:  Negative for chest pain, palpitations and leg swelling.  Gastrointestinal:  Negative for abdominal pain, constipation and diarrhea.  Musculoskeletal:  Positive for back pain. Negative for arthralgias and myalgias.  Neurological:  Positive for headaches. Negative for dizziness, weakness and light-headedness.  Psychiatric/Behavioral:  Negative for dysphoric mood and sleep disturbance. The patient is not nervous/anxious.      Lab Results  Component Value Date   NA 145 (H) 12/24/2022   K 4.9 12/24/2022   CO2 29 12/24/2022   GLUCOSE 100 (H) 12/24/2022   BUN 10 12/24/2022   CREATININE 0.78 12/24/2022   CALCIUM 9.3 12/24/2022   EGFR 85 12/24/2022    GFRNONAA >60 08/24/2020   Lab Results  Component Value Date   CHOL 158 12/24/2022   HDL 61 12/24/2022   LDLCALC 68 12/24/2022   TRIG 174 (H) 12/24/2022   CHOLHDL 2.6 12/24/2022   Lab Results  Component Value Date   TSH 3.380 12/24/2022   Lab Results  Component Value Date   HGBA1C 6.1 (H) 12/24/2022   Lab Results  Component Value Date   WBC 10.5 12/24/2022   HGB 14.2 12/24/2022   HCT 45.6 12/24/2022   MCV 92 12/24/2022   PLT 276 12/24/2022   Lab Results  Component Value Date   ALT 54 (H) 12/24/2022   AST 29 12/24/2022   ALKPHOS 115 12/24/2022   BILITOT 0.3 12/24/2022   Lab Results  Component Value Date   VD25OH 57.3 12/24/2022     Patient Active Problem List   Diagnosis Date Noted   Polyp of ascending colon 04/28/2023   Gastroesophageal reflux disease without esophagitis 04/28/2023   Prediabetes 03/01/2022   Aortic root dilatation (HCC) 01/16/2022   S/P total hysterectomy 03/16/2020   Dermatitis of both ear canals 03/01/2020   Pruritus 01/25/2020   Insomnia 06/25/2019   Osteopenia determined by x-ray 06/23/2018   Chronic otitis media 09/30/2017   Migraine without aura and without status migrainosus, not intractable 05/21/2017   Not currently working due to disabled status 01/24/2017   Degenerative arthritis 01/24/2017   Frequent PVCs 08/20/2016   History of colon polyps 04/24/2016   Periodic headache syndrome, not intractable 01/31/2016   Rosacea, acne 01/31/2016   Essential hypertension 08/02/2015   Hypothyroidism due to acquired atrophy of thyroid 08/02/2015  Fibromyalgia 08/02/2015   Sjogren's syndrome (HCC) 08/02/2015   Peptic ulcer disease 08/02/2015   Primary osteoarthritis of both knees 08/02/2015   Other allergic rhinitis 08/02/2015   Hyperlipidemia, mixed 08/02/2015    Allergies  Allergen Reactions   Ace Inhibitors Cough   Ketamine     Pt did  not like the way it made her feel   Morphine And Codeine Nausea Only   Savella [Milnacipran  Hcl] Palpitations    Past Surgical History:  Procedure Laterality Date   AUGMENTATION MAMMAPLASTY Bilateral 1987   COLONOSCOPY  05/2012   polyps   COLONOSCOPY WITH PROPOFOL N/A 06/20/2016   Procedure: COLONOSCOPY WITH PROPOFOL;  Surgeon: Midge Minium, MD;  [facility];  Service: Endoscopy;  Laterality: N/A;   COLONOSCOPY WITH PROPOFOL N/A 11/11/2019   Procedure: COLONOSCOPY WITH PROPOFOL;  Surgeon: Midge Minium, MD;  Location: [facility];  Service: Endoscopy;  Laterality: N/A;  3   COLONOSCOPY WITH PROPOFOL N/A 03/06/2020   Procedure: COLONOSCOPY WITH BIOPSY;  Surgeon: Midge Minium, MD;  Location: [facility];  Service: Endoscopy;  Laterality: N/A;   COLONOSCOPY WITH PROPOFOL N/A 04/28/2023   Procedure: COLONOSCOPY WITH BIOPSY;  Surgeon: Midge Minium, MD;  Location: [facility];  Service: Endoscopy;  Laterality: N/A;   ESOPHAGOGASTRODUODENOSCOPY (EGD) WITH PROPOFOL N/A 06/20/2016   Procedure: ESOPHAGOGASTRODUODENOSCOPY (EGD) WITH PROPOFOL;  Surgeon: Midge Minium, MD;  Location[facility];  Service: Endoscopy;  Laterality: N/A;   ESOPHAGOGASTRODUODENOSCOPY (EGD) WITH PROPOFOL N/A 11/11/2019   Procedure: ESOPHAGOGASTRODUODENOSCOPY (EGD) WITH PROPOFOL;  Surgeon: Midge Minium, MD;  Location: [facility];  Service: Endoscopy;  Laterality: N/A;   ESOPHAGOGASTRODUODENOSCOPY (EGD) WITH PROPOFOL N/A 03/06/2020   Procedure: ESOPHAGOGASTRODUODENOSCOPY (EGD) WITH BIOPSY;  Surgeon: Midge Minium, MD;  [facility];  Service: Endoscopy;  Laterality: N/A;   ESOPHAGOGASTRODUODENOSCOPY (EGD) WITH PROPOFOL N/A 04/28/2023   Procedure: ESOPHAGOGASTRODUODENOSCOPY (EGD) WITH BIOPSY;  Surgeon: Midge Minium, MD;  Location: [facility];  Service: Endoscopy;  Laterality: N/A;   FOOT SURGERY Right    GANGLION CYST EXCISION Left    NASAL SINUS SURGERY     NEUROMA SURGERY Left    OPEN REDUCTION INTERNAL FIXATION (ORIF) DISTAL RADIAL  FRACTURE Left 08/24/2020   Procedure: OPEN REDUCTION INTERNAL FIXATION (ORIF) DISTAL RADIAL FRACTURE;  Surgeon: Christena Flake, MD;  Location: [facility] ORS;  Service: Orthopedics;  Laterality: Left;   OPEN REDUCTION INTERNAL FIXATION (ORIF) DISTAL RADIAL FRACTURE Right 09/13/2022   Procedure: OPEN REDUCTION INTERNAL FIXATION (ORIF) DISTAL RADIUS FRACTURE;  Surgeon: Kennedy Bucker, MD;  Location: [facility];  Service: Orthopedics;  Laterality: Right;   PLACEMENT OF BREAST IMPLANTS     POLYPECTOMY  06/20/2016   Procedure: POLYPECTOMY;  Surgeon: Midge Minium, MD;  Location: [facility];  Service: Endoscopy;;   POLYPECTOMY N/A 03/06/2020   Procedure: POLYPECTOMY;  Surgeon: Midge Minium, MD;  Location: [facility];  Service: Endoscopy;  Laterality: N/A;   POLYPECTOMY  04/28/2023   Procedure: POLYPECTOMY;  Surgeon: Midge Minium, MD;  Location: [facility];  Service: Endoscopy;;   REPLACEMENT TOTAL KNEE Right 06/2014   TONSILLECTOMY     TOTAL VAGINAL HYSTERECTOMY  1996   cervical dysplasia    Social History   Tobacco Use   Smoking status: Never   Smokeless tobacco: Never  Vaping Use   Vaping status: Never Used  Substance Use Topics   Alcohol use: No    Alcohol/week: 0.0 standard drinks of alcohol   Drug use: No  Medication list has been reviewed and updated.  Current Meds  Medication Sig   alendronate (FOSAMAX) 70 MG tablet Take 1 tablet (70 mg total) by mouth every 7 (seven) days. Take with a full glass of water on an empty stomach.   Calcium Carb-Cholecalciferol (CALCIUM 600 + D PO) Take by mouth daily.   cyclobenzaprine (FLEXERIL) 10 MG tablet Take 1 tablet (10 mg total) by mouth 3 (three) times daily as needed for muscle spasms.   DULoxetine (CYMBALTA) 60 MG capsule TAKE 1 CAPSULE BY MOUTH DAILY   furosemide (LASIX) 40 MG tablet TAKE 1 TABLET BY MOUTH DAILY   gabapentin (NEURONTIN) 600 MG tablet TAKE 1 TABLET BY MOUTH 3 TIMES  DAILY    hydrochlorothiazide (HYDRODIURIL) 12.5 MG tablet Take 12.5 mg by mouth daily.   hydrocortisone 2.5 % cream APPLY TOPICALLY TO AFFECTED  AREA(S) TWICE DAILY   levothyroxine (SYNTHROID) 88 MCG tablet TAKE 1 TABLET BY MOUTH DAILY  BEFORE BREAKFAST   mometasone (ELOCON) 0.1 % lotion Apply topically daily. To both ears as needed   Multiple Vitamin (MULTIVITAMIN ADULT PO) Take 1 tablet by mouth daily.   Omega-3 Fatty Acids (FISH OIL PO) Take by mouth.   pantoprazole (PROTONIX) 40 MG tablet TAKE 1 TABLET BY MOUTH TWICE  DAILY   propranolol (INDERAL) 40 MG tablet TAKE 1 TABLET BY MOUTH TWICE  DAILY   rosuvastatin (CRESTOR) 20 MG tablet Take 20 mg by mouth at bedtime.   tobramycin-dexamethasone (TOBRADEX) ophthalmic solution Place 1 drop into both eyes every 6 (six) hours.   [DISCONTINUED] butalbital-apap-caffeine-codeine (FIORICET WITH CODEINE) 50-325-40-30 MG capsule Take 1-2 capsules by mouth every 6 (six) hours as needed for headache. Maximum 6 caps per day.   [DISCONTINUED] promethazine (PHENERGAN) 25 MG tablet TAKE 1 TABLET BY MOUTH EVERY 8 HOURS AS NEEDED FOR NAUSEA AND FOR VOMITING   [DISCONTINUED] tiZANidine (ZANAFLEX) 4 MG tablet TAKE 1 TABLET BY MOUTH 3 TIMES  DAILY       08/18/2023    9:26 AM 04/01/2023   10:23 AM 12/24/2022    9:39 AM 03/01/2022    9:51 AM  GAD 7 : Generalized Anxiety Score  Nervous, Anxious, on Edge 0 0 0 0  Control/stop worrying 0 0 0 0  Worry too much - different things 0 0 0 0  Trouble relaxing 0 0 0 0  Restless 0 0 0 0  Easily annoyed or irritable 0 0 0 0  Afraid - awful might happen 0 0 0 0  Total GAD 7 Score 0 0 0 0  Anxiety Difficulty Not difficult at all Not difficult at all Not difficult at all Not difficult at all       08/18/2023    9:26 AM 05/14/2023    9:06 AM 04/01/2023   10:23 AM  Depression screen PHQ 2/9  Decreased Interest 0 0 0  Down, Depressed, Hopeless 0 0 0  PHQ - 2 Score 0 0 0  Altered sleeping 0 0 0  Tired, decreased energy 0 0 0   Change in appetite 0 0 0  Feeling bad or failure about yourself  0 0 0  Trouble concentrating 0 0 0  Moving slowly or fidgety/restless 0 0 0  Suicidal thoughts 0 0 0  PHQ-9 Score 0 0 0  Difficult doing work/chores Not difficult at all Not difficult at all Not difficult at all    BP Readings from Last 3 Encounters:  08/18/23 112/70  04/28/23 100/70  04/07/23 (!) 125/94  Physical Exam Vitals and nursing note reviewed.  Constitutional:      General: She is not in acute distress.    Appearance: Normal appearance. She is well-developed.  HENT:     Head: Normocephalic and atraumatic.  Cardiovascular:     Rate and Rhythm: Normal rate and regular rhythm.  Pulmonary:     Effort: Pulmonary effort is normal. No respiratory distress.     Breath sounds: No wheezing or rhonchi.  Musculoskeletal:     Cervical back: Normal range of motion.     Right lower leg: No edema.     Left lower leg: No edema.  Skin:    General: Skin is warm and dry.     Findings: No rash.  Neurological:     Mental Status: She is alert and oriented to person, place, and time.  Psychiatric:        Mood and Affect: Mood normal.        Behavior: Behavior normal.     Wt Readings from Last 3 Encounters:  08/18/23 182 lb (82.6 kg)  04/28/23 190 lb (86.2 kg)  04/07/23 197 lb (89.4 kg)    BP 112/70   Pulse 64   Ht 5\' 4"  (1.626 m)   Wt 182 lb (82.6 kg)   SpO2 97%   BMI 31.24 kg/m   Assessment and Plan:  Problem List Items Addressed This Visit       Unprioritized   Essential hypertension - Primary (Chronic)   Blood pressures is well controlled.  Current medications propranolol and diuretics. Will continue same regimen along with efforts to limit dietary sodium.       Fibromyalgia (Chronic)   Relevant Medications   tiZANidine (ZANAFLEX) 4 MG tablet   butalbital-apap-caffeine-codeine (FIORICET WITH CODEINE) 50-325-40-30 MG capsule   Migraine without aura and without status migrainosus, not  intractable (Chronic)   No recent change in migraine headaches. Headaches respond well to current therapy with fioricet with codeine. Will continue regimen;  follow up if worsening.       Relevant Medications   tiZANidine (ZANAFLEX) 4 MG tablet   promethazine (PHENERGAN) 25 MG tablet   butalbital-apap-caffeine-codeine (FIORICET WITH CODEINE) 50-325-40-30 MG capsule   Osteopenia determined by x-ray   Recent vertebral compression fracture - healing Wrist fractures in the past few years. Will start Fosamax.      Relevant Medications   alendronate (FOSAMAX) 70 MG tablet   Other Visit Diagnoses       Osteoporotic compression fracture of spine with routine healing       Relevant Medications   alendronate (FOSAMAX) 70 MG tablet       Return in about 4 months (around 12/18/2023) for CPX.    Reubin Milan, MD [facility] Health Primary Care and Sports Medicine [place]

## 2023-08-18 NOTE — Patient Instructions (Signed)
 Call [facility] to schedule your mammogram at [phone].

## 2023-08-18 NOTE — Telephone Encounter (Signed)
 PA initaled for butalbital-apap-caffeine-codeine (FIORICET WITH CODEINE) 50-325-40-30 MG capsule on [email or web address] for 60 quantity capsule for 30 days and it says medication does not need PA and it was approved.    Approval key: [id number]  Dr. Judithann Graves informed.

## 2023-08-18 NOTE — Assessment & Plan Note (Signed)
 Blood pressures is well controlled.  Current medications propranolol and diuretics. Will continue same regimen along with efforts to limit dietary sodium.

## 2023-08-26 ENCOUNTER — Ambulatory Visit
Admission: RE | Admit: 2023-08-26 | Discharge: 2023-08-26 | Disposition: A | Discharge status: Home / Self Care | Source: Ambulatory Visit | Attending: Internal Medicine | Admitting: Internal Medicine

## 2023-08-26 DIAGNOSIS — Z1231 Encounter for screening mammogram for malignant neoplasm of breast: Secondary | ICD-10-CM | POA: Insufficient documentation

## 2023-08-28 ENCOUNTER — Other Ambulatory Visit: Payer: Self-pay | Admitting: Internal Medicine

## 2023-08-28 DIAGNOSIS — R928 Other abnormal and inconclusive findings on diagnostic imaging of breast: Secondary | ICD-10-CM

## 2023-08-29 ENCOUNTER — Encounter

## 2023-09-01 ENCOUNTER — Other Ambulatory Visit: Payer: Self-pay | Admitting: Internal Medicine

## 2023-09-01 ENCOUNTER — Other Ambulatory Visit: Payer: Self-pay

## 2023-09-01 DIAGNOSIS — M797 Fibromyalgia: Secondary | ICD-10-CM

## 2023-09-01 DIAGNOSIS — I10 Essential (primary) hypertension: Secondary | ICD-10-CM

## 2023-09-01 MED ORDER — TIZANIDINE HCL 4 MG PO TABS
4.0000 mg | ORAL_TABLET | Freq: Three times a day (TID) | ORAL | 0 refills | Status: DC
Start: 2023-09-01 — End: 2023-12-05

## 2023-09-01 MED ORDER — FUROSEMIDE 40 MG PO TABS
40.0000 mg | ORAL_TABLET | Freq: Every day | ORAL | 0 refills | Status: DC
Start: 2023-09-01 — End: 2023-12-05

## 2023-09-01 NOTE — Telephone Encounter (Signed)
 Copied from CRM [id number]. Topic: Clinical - Prescription Issue >> Sep 01, 2023 11:54 AM Antwanette L wrote: Reason for CRM: Patient said [facility] never received a 90 day supply for  furosemide (LASIX) 40 MG tablet and tiZANidine (ZANAFLEX) 4 MG tablet. Please have Dr. Judithann Graves send in this request. Patient can be contacted by phone at [phone]

## 2023-09-03 ENCOUNTER — Ambulatory Visit
Admission: RE | Admit: 2023-09-03 | Discharge: 2023-09-03 | Disposition: A | Discharge status: Home / Self Care | Source: Ambulatory Visit | Attending: Internal Medicine | Admitting: Internal Medicine

## 2023-09-03 DIAGNOSIS — R92333 Mammographic heterogeneous density, bilateral breasts: Secondary | ICD-10-CM | POA: Diagnosis not present

## 2023-09-03 DIAGNOSIS — R928 Other abnormal and inconclusive findings on diagnostic imaging of breast: Secondary | ICD-10-CM | POA: Insufficient documentation

## 2023-09-03 DIAGNOSIS — R921 Mammographic calcification found on diagnostic imaging of breast: Secondary | ICD-10-CM | POA: Diagnosis not present

## 2023-09-04 ENCOUNTER — Telehealth: Payer: Self-pay

## 2023-09-04 ENCOUNTER — Other Ambulatory Visit: Payer: Self-pay | Admitting: Internal Medicine

## 2023-09-04 DIAGNOSIS — M8580 Other specified disorders of bone density and structure, unspecified site: Secondary | ICD-10-CM

## 2023-09-04 DIAGNOSIS — R928 Other abnormal and inconclusive findings on diagnostic imaging of breast: Secondary | ICD-10-CM

## 2023-09-04 DIAGNOSIS — M8088XD Other osteoporosis with current pathological fracture, vertebra(e), subsequent encounter for fracture with routine healing: Secondary | ICD-10-CM

## 2023-09-04 MED ORDER — ALENDRONATE SODIUM 70 MG PO TABS: 70.0000 mg | ORAL_TABLET | ORAL | 3 refills | Status: DC

## 2023-09-04 NOTE — Telephone Encounter (Signed)
 Called pt let her know that 12 tablets is for 3 months.   KP  Copied from CRM [id number]. Topic: Clinical - Prescription Issue >> Sep 04, 2023 10:40 AM Ashley Glover wrote: Reason for CRM: patient called in stating that the pharmacy [facility] told her that she was only going to receive a  84 day supply of the alendronate (FOSAMAX) 70 MG tablet instead of 90 day supply. Patient is wondering If there is anyway that Dr.Berglund can call them and put it in for 90 days instead of 84 day supply. If not possible then to call patient.

## 2023-09-08 ENCOUNTER — Encounter: Payer: Self-pay | Admitting: Internal Medicine

## 2023-09-09 ENCOUNTER — Telehealth: Payer: Self-pay | Admitting: Internal Medicine

## 2023-09-09 NOTE — Telephone Encounter (Signed)
 Copied from CRM [id number]. Topic: General - Billing Inquiry >> Sep 09, 2023 11:30 AM Everette C wrote: Reason for CRM: The patient has called to ensure that no additional prior authorization will be needed for their upcoming biopsy of their right breast scheduled for tomorrow 09/10/23  Please contact the patient further when possible

## 2023-09-09 NOTE — Telephone Encounter (Signed)
 Please review and advise.

## 2023-09-10 ENCOUNTER — Telehealth: Payer: Self-pay

## 2023-09-10 ENCOUNTER — Telehealth: Payer: Self-pay | Admitting: Internal Medicine

## 2023-09-10 ENCOUNTER — Ambulatory Visit
Admission: RE | Admit: 2023-09-10 | Discharge: 2023-09-10 | Disposition: A | Discharge status: Home / Self Care | Source: Ambulatory Visit | Attending: Internal Medicine | Admitting: Internal Medicine

## 2023-09-10 DIAGNOSIS — N641 Fat necrosis of breast: Secondary | ICD-10-CM | POA: Insufficient documentation

## 2023-09-10 DIAGNOSIS — R921 Mammographic calcification found on diagnostic imaging of breast: Secondary | ICD-10-CM | POA: Diagnosis not present

## 2023-09-10 DIAGNOSIS — R928 Other abnormal and inconclusive findings on diagnostic imaging of breast: Secondary | ICD-10-CM

## 2023-09-10 HISTORY — PX: BREAST BIOPSY: SHX20

## 2023-09-10 MED ORDER — LIDOCAINE-EPINEPHRINE 1 %-1:100000 IJ SOLN
20.0000 mL | Freq: Once | INTRAMUSCULAR | Status: AC
  Administered 2023-09-10: 20 mL
  Filled 2023-09-10: qty 20

## 2023-09-10 MED ORDER — LIDOCAINE 1 % OPTIME INJ - NO CHARGE
5.0000 mL | Freq: Once | INTRAMUSCULAR | Status: AC
  Administered 2023-09-10: 5 mL
  Filled 2023-09-10: qty 6

## 2023-09-10 NOTE — Telephone Encounter (Signed)
 Duplicate message.  KP  Copied from CRM [id number]. Topic: General - Billing Inquiry >> Sep 09, 2023 11:30 AM Everette C wrote: Reason for CRM: The patient has called to ensure that no additional prior authorization will be needed for their upcoming biopsy of their right breast scheduled for tomorrow 09/10/23  Please contact the patient further when possible >> Sep 09, 2023  4:50 PM Bridgette Campus T wrote: Patient still waiting for a confirmation that authorization is not needed for the biopsy.

## 2023-09-10 NOTE — Telephone Encounter (Signed)
 Called patient. No answer. Left VM message to call back.

## 2023-09-10 NOTE — Telephone Encounter (Signed)
 Spoke with patient and she has already had procedure done.  JM  Copied from CRM [id number]. Topic: General - Billing Inquiry >> Sep 09, 2023 11:30 AM Everette C wrote: Reason for CRM: The patient has called to ensure that no additional prior authorization will be needed for their upcoming biopsy of their right breast scheduled for tomorrow 09/10/23  Please contact the patient further when possible >> Sep 10, 2023  1:13 PM Donald Frost wrote: The patient contacted radiology and they said the prior authorization absolutely has to come from the ordering provider. She says she has already had the biopsy so it is time sensitive and if it is required it needs to be retroed to cover the biopsy. Please let the patient know when this is taken care of >> Sep 09, 2023  4:50 PM Bridgette Campus T wrote: Patient still waiting for a confirmation that authorization is not needed for the biopsy.

## 2023-09-10 NOTE — Telephone Encounter (Signed)
 Copied from CRM [id number]. Topic: General - Billing Inquiry >> Sep 09, 2023 11:30 AM Everette C wrote: Reason for CRM: The patient has called to ensure that no additional prior authorization will be needed for their upcoming biopsy of their right breast scheduled for tomorrow 09/10/23  Please contact the patient further when possible >> Sep 09, 2023  4:50 PM Bridgette Campus T wrote: Patient still waiting for a confirmation that authorization is not needed for the biopsy.

## 2023-09-11 ENCOUNTER — Encounter: Payer: Self-pay | Admitting: Internal Medicine

## 2023-09-11 DIAGNOSIS — Z9889 Other specified postprocedural states: Secondary | ICD-10-CM | POA: Insufficient documentation

## 2023-09-11 LAB — SURGICAL PATHOLOGY

## 2023-09-12 ENCOUNTER — Telehealth: Payer: Self-pay

## 2023-09-12 NOTE — Telephone Encounter (Signed)
 Pt got additional imaging done 09/10/23.  KP Copied from CRM [id number]. Topic: General - Billing Inquiry >> Sep 09, 2023 11:30 AM Ashley Glover wrote: Reason for CRM: The patient has called to ensure that no additional prior authorization will be needed for their upcoming biopsy of their right breast scheduled for tomorrow 09/10/23  Please contact the patient further when possible >> Sep 10, 2023  1:13 PM Ashley Glover wrote: The patient contacted radiology and they said the prior authorization absolutely has to come from the ordering provider. She says she has already had the biopsy so it is time sensitive and if it is required it needs to be retroed to cover the biopsy. Please let the patient know when this is taken care of >> Sep 09, 2023  4:50 PM Ashley Glover wrote: Patient still waiting for a confirmation that authorization is not needed for the biopsy.

## 2023-10-03 DIAGNOSIS — M2012 Hallux valgus (acquired), left foot: Secondary | ICD-10-CM | POA: Diagnosis not present

## 2023-10-03 DIAGNOSIS — M19072 Primary osteoarthritis, left ankle and foot: Secondary | ICD-10-CM | POA: Diagnosis not present

## 2023-10-03 DIAGNOSIS — G8929 Other chronic pain: Secondary | ICD-10-CM | POA: Diagnosis not present

## 2023-10-03 DIAGNOSIS — M2141 Flat foot [pes planus] (acquired), right foot: Secondary | ICD-10-CM | POA: Diagnosis not present

## 2023-10-03 DIAGNOSIS — M249 Joint derangement, unspecified: Secondary | ICD-10-CM | POA: Diagnosis not present

## 2023-10-03 DIAGNOSIS — Z96651 Presence of right artificial knee joint: Secondary | ICD-10-CM | POA: Diagnosis not present

## 2023-10-03 DIAGNOSIS — M2041 Other hammer toe(s) (acquired), right foot: Secondary | ICD-10-CM | POA: Diagnosis not present

## 2023-10-03 DIAGNOSIS — M7741 Metatarsalgia, right foot: Secondary | ICD-10-CM | POA: Diagnosis not present

## 2023-10-03 DIAGNOSIS — M19071 Primary osteoarthritis, right ankle and foot: Secondary | ICD-10-CM | POA: Diagnosis not present

## 2023-10-03 DIAGNOSIS — M2142 Flat foot [pes planus] (acquired), left foot: Secondary | ICD-10-CM | POA: Diagnosis not present

## 2023-10-03 DIAGNOSIS — M7742 Metatarsalgia, left foot: Secondary | ICD-10-CM | POA: Diagnosis not present

## 2023-10-03 DIAGNOSIS — M216X1 Other acquired deformities of right foot: Secondary | ICD-10-CM | POA: Diagnosis not present

## 2023-10-03 DIAGNOSIS — M1712 Unilateral primary osteoarthritis, left knee: Secondary | ICD-10-CM | POA: Diagnosis not present

## 2023-10-03 DIAGNOSIS — M7051 Other bursitis of knee, right knee: Secondary | ICD-10-CM | POA: Diagnosis not present

## 2023-10-03 DIAGNOSIS — M216X2 Other acquired deformities of left foot: Secondary | ICD-10-CM | POA: Diagnosis not present

## 2023-10-03 DIAGNOSIS — M2011 Hallux valgus (acquired), right foot: Secondary | ICD-10-CM | POA: Diagnosis not present

## 2023-10-03 DIAGNOSIS — M25571 Pain in right ankle and joints of right foot: Secondary | ICD-10-CM | POA: Diagnosis not present

## 2023-10-10 ENCOUNTER — Other Ambulatory Visit: Payer: Self-pay | Admitting: Internal Medicine

## 2023-10-10 DIAGNOSIS — E034 Atrophy of thyroid (acquired): Secondary | ICD-10-CM

## 2023-10-13 NOTE — Telephone Encounter (Signed)
 Requested medications are due for refill today.  yes  Requested medications are on the active medications list.  yes  Last refill. 05/06/2023 90g 0 rf  Future visit scheduled.   yes  Notes to clinic.  Medication not assigned to a protocol. Please review for refill.    Requested Prescriptions  Pending Prescriptions Disp Refills   hydrocortisone  2.5 % cream [Pharmacy Med Name: HYDROCORTISONE   2.5%  CRE] 90 g 0    Sig: APPLY TOPICALLY TO AFFECTED  AREA(S) TWICE DAILY     Off-Protocol Failed - 10/13/2023  4:41 PM      Failed - Medication not assigned to a protocol, review manually.      Passed - Valid encounter within last 12 months    Recent Outpatient Visits           1 month ago Essential hypertension   [place] [facility] at [facility], Chales [place], MD       Future Appointments             In 3 months Artemio Larry, MD [facility] [place] [facility]   In 4 months Gala Jubilee [place], MD [facility] Primary Care & [facility] Medicine at [facility], [facility]            Signed Prescriptions Disp Refills   DULoxetine  (CYMBALTA ) 60 MG capsule 100 capsule 0    Sig: TAKE 1 CAPSULE BY MOUTH DAILY     Psychiatry: Antidepressants - SNRI - duloxetine  Passed - 10/13/2023  4:41 PM      Passed - Cr in normal range and within 360 days    Creatinine, Ser  Date Value Ref Range Status  12/24/2022 0.78 0.57 - 1.00 mg/dL Final         Passed - eGFR is 30 or above and within 360 days    GFR calc Af Amer  Date Value Ref Range Status  07/10/2020 76 >59 mL/min/1.73 Final    Comment:    **In accordance with recommendations from the NKF-ASN Task force,**   [facility] is in the process of updating its eGFR calculation to the   2021 CKD-EPI creatinine equation that estimates kidney function   without a race variable.    GFR, Estimated  Date Value Ref Range Status  08/24/2020 >60 >60 mL/min Final    Comment:    (NOTE) Calculated using the CKD-EPI  Creatinine Equation (2021)    eGFR  Date Value Ref Range Status  12/24/2022 85 >59 mL/min/1.73 Final         Passed - Completed PHQ-2 or PHQ-9 in the last 360 days      Passed - Last BP in normal range    BP Readings from Last 1 Encounters:  08/18/23 112/70         Passed - Valid encounter within last 6 months    Recent Outpatient Visits           1 month ago Essential hypertension   [place] [facility] at [facility], Chales [place], MD       Future Appointments             In 3 months Artemio Larry, MD [facility] [place] [facility]   In 4 months Gala Jubilee [place], MD [facility] Primary Care & Sports Medicine at [facility], [facility]             levothyroxine  (SYNTHROID ) 88 MCG tablet 100 tablet 0  Sig: TAKE 1 TABLET BY MOUTH DAILY  BEFORE BREAKFAST     Endocrinology:  Hypothyroid Agents Passed - 10/13/2023  4:41 PM      Passed - TSH in normal range and within 360 days    TSH  Date Value Ref Range Status  12/24/2022 3.380 0.450 - 4.500 uIU/mL Final         Passed - Valid encounter within last 12 months    Recent Outpatient Visits           1 month ago Essential hypertension   [place] [facility] Medicine at [facility], Chales [place], MD       Future Appointments             In 3 months Artemio Larry, MD [facility] [place] [facility]   In 4 months Gala Jubilee, [place], MD [facility] Primary [facility] Medicine at [facility], [facility]

## 2023-10-13 NOTE — Telephone Encounter (Signed)
 Requested Prescriptions  Pending Prescriptions Disp Refills   hydrocortisone  2.5 % cream [Pharmacy Med Name: HYDROCORTISONE   2.5%  CRE] 90 g 0    Sig: APPLY TOPICALLY TO AFFECTED  AREA(S) TWICE DAILY     Off-Protocol Failed - 10/13/2023  4:41 PM      Failed - Medication not assigned to a protocol, review manually.      Passed - Valid encounter within last 12 months    Recent Outpatient Visits           1 month ago Essential hypertension   [place] [facility] at [facility], Chales [place], MD       Future Appointments             In 3 months Artemio Larry, MD [facility] [place] [facility]   In 4 months Gala Jubilee [place], MD [facility] Primary Care & Sports Medicine at [facility], [facility]             DULoxetine  (CYMBALTA ) 60 MG capsule [Pharmacy Med Name: DULoxetine  HCl 60 MG Oral Capsule Delayed Release Particles] 100 capsule 0    Sig: TAKE 1 CAPSULE BY MOUTH DAILY     Psychiatry: Antidepressants - SNRI - duloxetine  Passed - 10/13/2023  4:41 PM      Passed - Cr in normal range and within 360 days    Creatinine, Ser  Date Value Ref Range Status  12/24/2022 0.78 0.57 - 1.00 mg/dL Final         Passed - eGFR is 30 or above and within 360 days    GFR calc Af Amer  Date Value Ref Range Status  07/10/2020 76 >59 mL/min/1.73 Final    Comment:    **In accordance with recommendations from the NKF-ASN Task force,**   [facility] is in the process of updating its eGFR calculation to the   2021 CKD-EPI creatinine equation that estimates kidney function   without a race variable.    GFR, Estimated  Date Value Ref Range Status  08/24/2020 >60 >60 mL/min Final    Comment:    (NOTE) Calculated using the CKD-EPI Creatinine Equation (2021)    eGFR  Date Value Ref Range Status  12/24/2022 85 >59 mL/min/1.73 Final         Passed - Completed PHQ-2 or PHQ-9 in the last 360 days      Passed - Last BP in normal range    BP Readings from Last 1  Encounters:  08/18/23 112/70         Passed - Valid encounter within last 6 months    Recent Outpatient Visits           1 month ago Essential hypertension   [place] [facility] Medicine at [facility], Chales [place], MD       Future Appointments             In 3 months Artemio Larry, MD [facility] [place] [facility]   In 4 months Gala Jubilee [place], MD [facility] Health Primary Care & Sports Medicine at [facility], [facility]             levothyroxine  (SYNTHROID ) 88 MCG tablet [Pharmacy Med Name: Levothyroxine  Sodium 88 MCG Oral Tablet] 100 tablet 0    Sig: TAKE 1 TABLET BY MOUTH DAILY  BEFORE BREAKFAST     Endocrinology:  Hypothyroid Agents Passed - 10/13/2023  4:41 PM      Passed - TSH in  normal range and within 360 days    TSH  Date Value Ref Range Status  12/24/2022 3.380 0.450 - 4.500 uIU/mL Final         Passed - Valid encounter within last 12 months    Recent Outpatient Visits           1 month ago Essential hypertension    Primary Care & Sports Medicine at [facility], Chales [place], MD       Future Appointments             In 3 months Artemio Larry, MD [facility] [place] [facility]   In 4 months Gala Jubilee, [place], MD [facility] Primary Care & Sports Medicine at [facility], [facility]

## 2023-12-02 ENCOUNTER — Other Ambulatory Visit: Payer: Self-pay | Admitting: Internal Medicine

## 2023-12-02 DIAGNOSIS — E034 Atrophy of thyroid (acquired): Secondary | ICD-10-CM

## 2023-12-02 DIAGNOSIS — I10 Essential (primary) hypertension: Secondary | ICD-10-CM

## 2023-12-02 DIAGNOSIS — M797 Fibromyalgia: Secondary | ICD-10-CM

## 2023-12-04 NOTE — Telephone Encounter (Signed)
 Requested medication (s) are due for refill today:   Provider to review  Requested medication (s) are on the active medication list:   Yes for all 5  Future visit scheduled:   Yes 9/24   LOV 08/18/2023   Last ordered: Zanaflex  09/01/2023 #270, 0 refills;   Lasix  09/01/2023 #90, 0 refills;   Synthroid  10/13/2023 #100, 0 refills;   Cymbalta  10/13/2023 #100, 0 refills;   Hydrocortisone  cream 10/14/2023 #90, 0 refills  Non delegated refills and labs due to Lasix .   Requested Prescriptions  Pending Prescriptions Disp Refills   tiZANidine  (ZANAFLEX ) 4 MG tablet [Pharmacy Med Name: tiZANidine  HCl 4 MG Oral Tablet] 270 tablet 0    Sig: TAKE 1 TABLET BY MOUTH 3 TIMES  DAILY     Not Delegated - Cardiovascular:  Alpha-2 Agonists - tizanidine  Failed - 12/04/2023  3:03 PM      Failed - This refill cannot be delegated      Passed - Valid encounter within last 6 months    Recent Outpatient Visits           3 months ago Essential hypertension   [place] [facility] at [facility], Leita DEL, MD       Future Appointments             In 1 month Jackquline Sawyer, MD [facility]   In 2 months Justus, Leita DEL, MD [facility] Primary Care & Sports Medicine at [facility], [facility]             furosemide  (LASIX ) 40 MG tablet [Pharmacy Med Name: Furosemide  40 MG Oral Tablet] 90 tablet 3    Sig: TAKE 1 TABLET BY MOUTH DAILY     Cardiovascular:  Diuretics - Loop Failed - 12/04/2023  3:03 PM      Failed - K in normal range and within 180 days    Potassium  Date Value Ref Range Status  12/24/2022 4.9 3.5 - 5.2 mmol/L Final         Failed - Ca in normal range and within 180 days    Calcium  Date Value Ref Range Status  12/24/2022 9.3 8.7 - 10.3 mg/dL Final         Failed - Na in normal range and within 180 days    Sodium  Date Value Ref Range Status  12/24/2022 145 (H) 134 - 144 mmol/L Final         Failed - Cr in normal range and within  180 days    Creatinine, Ser  Date Value Ref Range Status  12/24/2022 0.78 0.57 - 1.00 mg/dL Final         Failed - Cl in normal range and within 180 days    Chloride  Date Value Ref Range Status  12/24/2022 103 96 - 106 mmol/L Final         Failed - Mg Level in normal range and within 180 days    No results found for: MG       Passed - Last BP in normal range    BP Readings from Last 1 Encounters:  08/18/23 112/70         Passed - Valid encounter within last 6 months    Recent Outpatient Visits           3 months ago Essential hypertension   [place] [facility] at [facility], Leita DEL, MD  Future Appointments             In 1 month Jackquline Sawyer, MD [facility] [place] [facility]   In 2 months Justus, Leita DEL, MD [facility] Medicine at [facility], [facility]             levothyroxine  (SYNTHROID ) 88 MCG tablet [Pharmacy Med Name: Levothyroxine  Sodium 88 MCG Oral Tablet] 100 tablet 2    Sig: TAKE 1 TABLET BY MOUTH DAILY  BEFORE BREAKFAST     Endocrinology:  Hypothyroid Agents Passed - 12/04/2023  3:03 PM      Passed - TSH in normal range and within 360 days    TSH  Date Value Ref Range Status  12/24/2022 3.380 0.450 - 4.500 uIU/mL Final         Passed - Valid encounter within last 12 months    Recent Outpatient Visits           3 months ago Essential hypertension   [place] [facility] Medicine at [facility], Leita DEL, MD       Future Appointments             In 1 month Jackquline Sawyer, MD [facility]   In 2 months Justus Leita DEL, MD [facility] Primary Care & Sports Medicine at [facility], [facility]             DULoxetine  (CYMBALTA ) 60 MG capsule [Pharmacy Med Name: DULoxetine  HCl 60 MG Oral Capsule Delayed Release Particles] 100 capsule 2    Sig: TAKE 1 CAPSULE BY MOUTH DAILY     Psychiatry: Antidepressants - SNRI - duloxetine   Passed - 12/04/2023  3:03 PM      Passed - Cr in normal range and within 360 days    Creatinine, Ser  Date Value Ref Range Status  12/24/2022 0.78 0.57 - 1.00 mg/dL Final         Passed - eGFR is 30 or above and within 360 days    GFR calc Af Amer  Date Value Ref Range Status  07/10/2020 76 >59 mL/min/1.73 Final    Comment:    **In accordance with recommendations from the NKF-ASN Task force,**   [facility] is in the process of updating its eGFR calculation to the   2021 CKD-EPI creatinine equation that estimates kidney function   without a race variable.    GFR, Estimated  Date Value Ref Range Status  08/24/2020 >60 >60 mL/min Final    Comment:    (NOTE) Calculated using the CKD-EPI Creatinine Equation (2021)    eGFR  Date Value Ref Range Status  12/24/2022 85 >59 mL/min/1.73 Final         Passed - Completed PHQ-2 or PHQ-9 in the last 360 days      Passed - Last BP in normal range    BP Readings from Last 1 Encounters:  08/18/23 112/70         Passed - Valid encounter within last 6 months    Recent Outpatient Visits           3 months ago Essential hypertension   [place] [facility] at [facility], Leita DEL, MD       Future Appointments             In 1 month Jackquline Sawyer, MD [facility] [place] [facility]   In 2 months Justus Leita DEL, MD [facility]  Health Primary Care & Sports Medicine at [facility], [facility]             hydrocortisone  2.5 % cream [Pharmacy Med Name: HYDROCORTISONE   2.5%  CRE] 90 g 0    Sig: APPLY TOPICALLY TO AFFECTED  AREA(S) TWICE DAILY     Off-Protocol Failed - 12/04/2023  3:03 PM      Failed - Medication not assigned to a protocol, review manually.      Passed - Valid encounter within last 12 months    Recent Outpatient Visits           3 months ago Essential hypertension   [place] [facility] Medicine at [facility], Leita DEL, MD       Future Appointments              In 1 month Jackquline Sawyer, MD [facility] [place] [facility]   In 2 months Justus, Leita DEL, MD [facility] Primary Care & Sports Medicine at [facility], [facility]

## 2023-12-05 ENCOUNTER — Other Ambulatory Visit: Payer: Self-pay | Admitting: Internal Medicine

## 2023-12-05 ENCOUNTER — Telehealth: Payer: Self-pay | Admitting: Internal Medicine

## 2023-12-05 DIAGNOSIS — I10 Essential (primary) hypertension: Secondary | ICD-10-CM

## 2023-12-05 DIAGNOSIS — M8580 Other specified disorders of bone density and structure, unspecified site: Secondary | ICD-10-CM

## 2023-12-05 DIAGNOSIS — M797 Fibromyalgia: Secondary | ICD-10-CM

## 2023-12-05 DIAGNOSIS — M8088XD Other osteoporosis with current pathological fracture, vertebra(e), subsequent encounter for fracture with routine healing: Secondary | ICD-10-CM

## 2023-12-05 NOTE — Telephone Encounter (Signed)
 Patient stated that she does not have Mychart. Patient can not reach her results before she sees her surgeon. patient stated if dr berglund was the ordering physician she should have access to her mammograms.

## 2023-12-05 NOTE — Telephone Encounter (Signed)
 Please call patient & let her know she needs to reach out to [facility] for those records or she may get them off her MyChart.    Copied from CRM [id number]. Topic: Medical Record Request - Records Request >> Dec 05, 2023  8:37 AM Carlatta H wrote: Reason for CRM: Patient needs Mammogram from  years 2022/2023/2025 and biopsy report//Please call patient when they are ready

## 2023-12-05 NOTE — Telephone Encounter (Unsigned)
 Copied from CRM [id number]. Topic: Clinical - Medication Refill >> Dec 05, 2023  8:32 AM Carlatta H wrote: Medication: hydrocortisone  2.5 % cream gabapentin  (NEURONTIN ) 600 MG tablet rosuvastatin (CRESTOR) 20 MG tablet propranolol  (INDERAL ) 40 MG tablet alendronate  (FOSAMAX ) 70 MG tablet tiZANidine  (ZANAFLEX ) 4 MG tablet  Has the patient contacted their pharmacy? Yes (Agent: If no, request that the patient contact the pharmacy for the refill. If patient does not wish to contact the pharmacy document the reason why and proceed with request.) (Agent: If yes, when and what did the pharmacy advise?) Advised to contact office   This is the patient's preferred pharmacy:   [facility] - [place], [place] - [address] [place] [address] [place]  [address] Phone: [phone] Fax: [phone] Hours: Not open 24 hours      Is this the correct pharmacy for this prescription? Yes If no, delete pharmacy and type the correct one.   Has the prescription been filled recently? No  Is the patient out of the medication? Yes  Has the patient been seen for an appointment in the last year OR does the patient have an upcoming appointment? Yes  Can we respond through MyChart? Yes  Agent: Please be advised that Rx refills may take up to 3 business days. We ask that you follow-up with your pharmacy.

## 2023-12-08 MED ORDER — PROPRANOLOL HCL 40 MG PO TABS: 40.0000 mg | ORAL_TABLET | Freq: Two times a day (BID) | ORAL | 0 refills | Status: DC

## 2023-12-08 MED ORDER — GABAPENTIN 600 MG PO TABS: 600.0000 mg | ORAL_TABLET | Freq: Three times a day (TID) | ORAL | 0 refills | Status: DC

## 2023-12-08 NOTE — Telephone Encounter (Signed)
 Requested Prescriptions  Pending Prescriptions Disp Refills   hydrocortisone  2.5 % cream 90 g     Sig: APPLY TOPICALLY TO AFFECTED  AREA(S) TWICE DAILY     Off-Protocol Failed - 12/08/2023 11:44 AM      Failed - Medication not assigned to a protocol, review manually.      Passed - Valid encounter within last 12 months    Recent Outpatient Visits           3 months ago Essential hypertension   [place] [facility] at [facility], Leita DEL, MD       Future Appointments             In 1 month Jackquline Sawyer, MD [facility]   In 2 months Justus Leita DEL, MD [facility] at [facility]             gabapentin  (NEURONTIN ) 600 MG tablet 300 tablet 0    Sig: Take 1 tablet (600 mg total) by mouth 3 (three) times daily.     Neurology: Anticonvulsants - gabapentin  Passed - 12/08/2023 11:44 AM      Passed - Cr in normal range and within 360 days    Creatinine, Ser  Date Value Ref Range Status  12/24/2022 0.78 0.57 - 1.00 mg/dL Final         Passed - Completed PHQ-2 or PHQ-9 in the last 360 days      Passed - Valid encounter within last 12 months    Recent Outpatient Visits           3 months ago Essential hypertension   [place] [facility] at [facility], Leita DEL, MD       Future Appointments             In 1 month Jackquline Sawyer, MD [facility]   In 2 months Justus Leita DEL, MD [facility] at [facility]             rosuvastatin (CRESTOR) 20 MG tablet      Sig: Take 1 tablet (20 mg total) by mouth at bedtime.     Cardiovascular:  Antilipid - Statins 2 Failed - 12/08/2023 11:44 AM      Failed - Lipid Panel in normal range within the last 12 months    Cholesterol, Total  Date Value Ref Range Status  12/24/2022 158 100 - 199 mg/dL Final   LDL Chol Calc (NIH)  Date  Value Ref Range Status  12/24/2022 68 0 - 99 mg/dL Final   HDL  Date Value Ref Range Status  12/24/2022 61 >39 mg/dL Final   Triglycerides  Date Value Ref Range Status  12/24/2022 174 (H) 0 - 149 mg/dL Final         Passed - Cr in normal range and within 360 days    Creatinine, Ser  Date Value Ref Range Status  12/24/2022 0.78 0.57 - 1.00 mg/dL Final         Passed - Patient is not pregnant      Passed - Valid encounter within last 12 months    Recent Outpatient Visits           3 months ago Essential hypertension    Primary Care & Sports Medicine at [facility], Leita DEL, MD  Future Appointments             In 1 month Jackquline Sawyer, MD [facility]   In 2 months Justus Leita DEL, MD [facility] Primary [facility] at [facility]             propranolol  (INDERAL ) 40 MG tablet 200 tablet 0    Sig: Take 1 tablet (40 mg total) by mouth 2 (two) times daily.     Cardiovascular:  Beta Blockers Passed - 12/08/2023 11:44 AM      Passed - Last BP in normal range    BP Readings from Last 1 Encounters:  08/18/23 112/70         Passed - Last Heart Rate in normal range    Pulse Readings from Last 1 Encounters:  08/18/23 64         Passed - Valid encounter within last 6 months    Recent Outpatient Visits           3 months ago Essential hypertension   [place] [facility] at [facility], Leita DEL, MD       Future Appointments             In 1 month Jackquline Sawyer, MD [facility]   In 2 months Justus Leita DEL, MD [facility] Primary [facility] Sports Medicine at [facility]             alendronate  (FOSAMAX ) 70 MG tablet 13 tablet     Sig: Take 1 tablet (70 mg total) by mouth every 7 (seven) days. Take with a full glass of water  on an empty stomach.     Endocrinology:  Bisphosphonates Failed - 12/08/2023 11:44 AM       Failed - Mg Level in normal range and within 360 days    No results found for: MG       Failed - Phosphate in normal range and within 360 days    No results found for: PHOS       Passed - Ca in normal range and within 360 days    Calcium  Date Value Ref Range Status  12/24/2022 9.3 8.7 - 10.3 mg/dL Final         Passed - Vitamin D  in normal range and within 360 days    Vit D, 25-Hydroxy  Date Value Ref Range Status  12/24/2022 57.3 30.0 - 100.0 ng/mL Final    Comment:    Vitamin D  deficiency has been defined by the Institute of Medicine and an Endocrine Society practice guideline as a level of serum 25-OH vitamin D  less than 20 ng/mL (1,2). The Endocrine Society went on to further define vitamin D  insufficiency as a level between 21 and 29 ng/mL (2). 1. IOM (Institute of Medicine). 2010. Dietary reference    intakes for calcium and D. [place]: The    [employer]. 2. Holick MF, Binkley [place], Bischoff-Ferrari HA, et al.    Evaluation, treatment, and prevention of vitamin D     deficiency: an Endocrine Society clinical practice    guideline. JCEM. 2011 Jul; 96(7):1911-30.          Passed - Cr in normal range and within 360 days    Creatinine, Ser  Date Value Ref Range Status  12/24/2022 0.78 0.57 - 1.00 mg/dL Final         Passed - eGFR is 30  or above and within 360 days    GFR calc Af Amer  Date Value Ref Range Status  07/10/2020 76 >59 mL/min/1.73 Final    Comment:    **In accordance with recommendations from the NKF-ASN Task force,**   [facility] is in the process of updating its eGFR calculation to the   2021 CKD-EPI creatinine equation that estimates kidney function   without a race variable.    GFR, Estimated  Date Value Ref Range Status  08/24/2020 >60 >60 mL/min Final    Comment:    (NOTE) Calculated using the CKD-EPI Creatinine Equation (2021)    eGFR  Date Value Ref Range Status  12/24/2022 85 >59 mL/min/1.73 Final         Passed -  Valid encounter within last 12 months    Recent Outpatient Visits           3 months ago Essential hypertension   [place] [facility] at [facility], Leita DEL, MD       Future Appointments             In 1 month Jackquline Sawyer, MD [facility]   In 2 months Justus Leita DEL, MD [facility] Primary Care & Sports Medicine at [facility]            Passed - Bone Mineral Density or Dexa Scan completed in the last 2 years       tiZANidine  (ZANAFLEX ) 4 MG tablet 270 tablet     Sig: Take 1 tablet (4 mg total) by mouth 3 (three) times daily.     Not Delegated - Cardiovascular:  Alpha-2 Agonists - tizanidine  Failed - 12/08/2023 11:44 AM      Failed - This refill cannot be delegated      Passed - Valid encounter within last 6 months    Recent Outpatient Visits           3 months ago Essential hypertension   [place] [facility] at [facility], Leita DEL, MD       Future Appointments             In 1 month Jackquline Sawyer, MD [facility]   In 2 months Justus, Leita DEL, MD [facility] Primary Care & Sports Medicine at [facility], [facility]

## 2023-12-08 NOTE — Telephone Encounter (Signed)
 Patient is calling to request if her mamagram 2022, 2023, 2025 results, biopsy from 2025 can be printed for her. Patient does not have a computer, or internet, and limited data with her phone. Patient would like to pick up on Friday. Please advise

## 2023-12-08 NOTE — Telephone Encounter (Signed)
 Requested medication (s) are due for refill today:  Hydrocortisone : no  Rosuvastatin: hx med Alendronate : no Tizanidine : no   Requested medication (s) are on the active medication list: yes except rosuvastatin: hx med  Last refill:   Hydrocortisone : 12/05/23  Rosuvastatin: 01/25/20 Alendronate : 09/04/23  Tizanidine : 12/05/23  Future visit scheduled: yes  Notes to clinic:  Hydrocortisone : not assigned a protocol, NT not delegated to refuse/rosuvastatin: hx provider, tizanidine : med not delegated to NT to RF   Requested Prescriptions  Pending Prescriptions Disp Refills   hydrocortisone  2.5 % cream 90 g     Sig: APPLY TOPICALLY TO AFFECTED  AREA(S) TWICE DAILY     Off-Protocol Failed - 12/08/2023 11:45 AM      Failed - Medication not assigned to a protocol, review manually.      Passed - Valid encounter within last 12 months    Recent Outpatient Visits           3 months ago Essential hypertension   [place] [facility] at [facility], Leita DEL, MD       Future Appointments             In 1 month Jackquline Sawyer, MD [facility]   In 2 months Justus Leita DEL, MD [facility] Primary Care & Sports Medicine at [facility]             rosuvastatin (CRESTOR) 20 MG tablet      Sig: Take 1 tablet (20 mg total) by mouth at bedtime.     Cardiovascular:  Antilipid - Statins 2 Failed - 12/08/2023 11:45 AM      Failed - Lipid Panel in normal range within the last 12 months    Cholesterol, Total  Date Value Ref Range Status  12/24/2022 158 100 - 199 mg/dL Final   LDL Chol Calc (NIH)  Date Value Ref Range Status  12/24/2022 68 0 - 99 mg/dL Final   HDL  Date Value Ref Range Status  12/24/2022 61 >39 mg/dL Final   Triglycerides  Date Value Ref Range Status  12/24/2022 174 (H) 0 - 149 mg/dL Final         Passed - Cr in normal range and within 360 days    Creatinine, Ser  Date Value Ref Range Status   12/24/2022 0.78 0.57 - 1.00 mg/dL Final         Passed - Patient is not pregnant      Passed - Valid encounter within last 12 months    Recent Outpatient Visits           3 months ago Essential hypertension   [place] [facility] at [facility], Leita DEL, MD       Future Appointments             In 1 month Jackquline Sawyer, MD [place] [facility]   In 2 months Justus Leita DEL, MD [facility] Primary Care & Sports Medicine at [facility]             alendronate  (FOSAMAX ) 70 MG tablet 13 tablet     Sig: Take 1 tablet (70 mg total) by mouth every 7 (seven) days. Take with a full glass of water  on an empty stomach.     Endocrinology:  Bisphosphonates Failed - 12/08/2023 11:45 AM      Failed - Mg Level in normal range and within 360  days    No results found for: MG       Failed - Phosphate in normal range and within 360 days    No results found for: PHOS       Passed - Ca in normal range and within 360 days    Calcium  Date Value Ref Range Status  12/24/2022 9.3 8.7 - 10.3 mg/dL Final         Passed - Vitamin D  in normal range and within 360 days    Vit D, 25-Hydroxy  Date Value Ref Range Status  12/24/2022 57.3 30.0 - 100.0 ng/mL Final    Comment:    Vitamin D  deficiency has been defined by the Institute of Medicine and an Endocrine Society practice guideline as a level of serum 25-OH vitamin D  less than 20 ng/mL (1,2). The Endocrine Society went on to further define vitamin D  insufficiency as a level between 21 and 29 ng/mL (2). 1. IOM (Institute of Medicine). 2010. Dietary reference    intakes for calcium and D. [place]: The    [employer]. 2. Holick MF, Binkley [place], Bischoff-Ferrari HA, et al.    Evaluation, treatment, and prevention of vitamin D     deficiency: an Endocrine Society clinical practice    guideline. JCEM. 2011 Jul; 96(7):1911-30.          Passed - Cr in normal  range and within 360 days    Creatinine, Ser  Date Value Ref Range Status  12/24/2022 0.78 0.57 - 1.00 mg/dL Final         Passed - eGFR is 30 or above and within 360 days    GFR calc Af Amer  Date Value Ref Range Status  07/10/2020 76 >59 mL/min/1.73 Final    Comment:    **In accordance with recommendations from the NKF-ASN Task force,**   [facility] is in the process of updating its eGFR calculation to the   2021 CKD-EPI creatinine equation that estimates kidney function   without a race variable.    GFR, Estimated  Date Value Ref Range Status  08/24/2020 >60 >60 mL/min Final    Comment:    (NOTE) Calculated using the CKD-EPI Creatinine Equation (2021)    eGFR  Date Value Ref Range Status  12/24/2022 85 >59 mL/min/1.73 Final         Passed - Valid encounter within last 12 months    Recent Outpatient Visits           3 months ago Essential hypertension   [place] [facility] at [facility], Leita DEL, MD       Future Appointments             In 1 month Jackquline Sawyer, MD [facility]   In 2 months Justus Leita DEL, MD [facility] at [facility]            Passed - Bone Mineral Density or Dexa Scan completed in the last 2 years       tiZANidine  (ZANAFLEX ) 4 MG tablet 270 tablet     Sig: Take 1 tablet (4 mg total) by mouth 3 (three) times daily.     Not Delegated - Cardiovascular:  Alpha-2 Agonists - tizanidine  Failed - 12/08/2023 11:45 AM      Failed - This refill cannot be delegated      Passed - Valid encounter within last 6 months  Recent Outpatient Visits           3 months ago Essential hypertension   [place] [facility] at [facility], Leita DEL, MD       Future Appointments             In 1 month Jackquline Sawyer, MD [facility]   In 2 months Justus Leita DEL, MD [facility] Primary Care &  Sports Medicine at [facility], [facility]            Signed Prescriptions Disp Refills   gabapentin  (NEURONTIN ) 600 MG tablet 300 tablet 0    Sig: Take 1 tablet (600 mg total) by mouth 3 (three) times daily.     Neurology: Anticonvulsants - gabapentin  Passed - 12/08/2023 11:45 AM      Passed - Cr in normal range and within 360 days    Creatinine, Ser  Date Value Ref Range Status  12/24/2022 0.78 0.57 - 1.00 mg/dL Final         Passed - Completed PHQ-2 or PHQ-9 in the last 360 days      Passed - Valid encounter within last 12 months    Recent Outpatient Visits           3 months ago Essential hypertension   [place] [facility] at [facility], Leita DEL, MD       Future Appointments             In 1 month Jackquline Sawyer, MD [facility] [place]   In 2 months Justus Leita DEL, MD [facility] Primary Care & Sports Medicine at [facility], [facility]             propranolol  (INDERAL ) 40 MG tablet 200 tablet 0    Sig: Take 1 tablet (40 mg total) by mouth 2 (two) times daily.     Cardiovascular:  Beta Blockers Passed - 12/08/2023 11:45 AM      Passed - Last BP in normal range    BP Readings from Last 1 Encounters:  08/18/23 112/70         Passed - Last Heart Rate in normal range    Pulse Readings from Last 1 Encounters:  08/18/23 64         Passed - Valid encounter within last 6 months    Recent Outpatient Visits           3 months ago Essential hypertension   [place] [facility] at [facility], Leita DEL, MD       Future Appointments             In 1 month Jackquline Sawyer, MD [facility] [place]   In 2 months Justus, Leita DEL, MD [facility] Primary Care & Sports Medicine at [facility], [facility]

## 2023-12-25 DIAGNOSIS — T8543XA Leakage of breast prosthesis and implant, initial encounter: Secondary | ICD-10-CM | POA: Diagnosis not present

## 2024-01-20 ENCOUNTER — Ambulatory Visit: Payer: Medicare Other | Admitting: Dermatology

## 2024-01-20 ENCOUNTER — Encounter: Payer: Self-pay | Admitting: Dermatology

## 2024-01-20 DIAGNOSIS — L700 Acne vulgaris: Secondary | ICD-10-CM | POA: Diagnosis not present

## 2024-01-20 DIAGNOSIS — L821 Other seborrheic keratosis: Secondary | ICD-10-CM | POA: Diagnosis not present

## 2024-01-20 DIAGNOSIS — D692 Other nonthrombocytopenic purpura: Secondary | ICD-10-CM

## 2024-01-20 DIAGNOSIS — L738 Other specified follicular disorders: Secondary | ICD-10-CM | POA: Diagnosis not present

## 2024-01-20 MED ORDER — CLINDAMYCIN PHOS-BENZOYL PEROX 1.2-5 % EX GEL: CUTANEOUS | 5 refills | Status: DC

## 2024-01-20 NOTE — Progress Notes (Signed)
   New Patient Visit   Subjective  Ashley Glover is a 63 y.o. female who presents for the following: Spots on left hand, arms, ankles, left side of nose. No personal hx of skin cancer. Dad had skin cancer, unsure of type. Lesion on nose has bled after washing face.   Was seen by Dr. Ethan 01/2019.   Check face. C/O cystic acne flares every few months. Cheeks and chin.   The patient has spots, moles and lesions to be evaluated, some may be new or changing and the patient may have concern these could be cancer.    The following portions of the chart were reviewed this encounter and updated as appropriate: medications, allergies, medical history  Review of Systems:  No other skin or systemic complaints except as noted in HPI or Assessment and Plan.  Objective  Well appearing patient in no apparent distress; mood and affect are within normal limits.  A focused examination was performed of the following areas: Face, legs, arms, hands  Relevant physical exam findings are noted in the Assessment and Plan.    Assessment & Plan   Purpura - Chronic; persistent and recurrent.  Treatable, but not curable. - Violaceous macules and patches at left arm - Benign - Related to trauma, age, sun damage and/or use of blood thinners, chronic use of topical and/or oral steroids - Observe - Can use OTC arnica containing moisturizer such as Dermend Bruise Formula if desired - Call for worsening or other concerns  SEBORRHEIC KERATOSIS/Stucco Keratoses  - Stuck-on, waxy, tan-brown thin papule at L hand dorsum, B/L ankles with scattered waxy flesh papules - Benign-appearing - Discussed benign etiology and prognosis. - Observe - Call for any changes  Sebaceous Hyperplasia vs Benign Nevus - Small indistinct flesh papule at left lower nasal dorsum. 3 mm. Benign features under dermoscopy.  - Benign-appearing - Observe. Call for changes.   ACNE VULGARIS/Cystic Acne Exam: resolving cyst on L  cheek.  Chronic and persistent condition with duration or expected duration over one year. Condition is symptomatic/ bothersome to patient. Not currently at goal.  Treatment Plan:  Start generic Duac (benzoyl peroxide/clindamycin ) gel to face once or twice daily for acne.  Can discuss adding oral Spironolactone 50-100 mg daily, replacing another diuretic?, at next cardiology visit.   Spironolactone can cause increased urination and cause blood pressure to decrease. Please watch for signs of lightheadedness and be cautious when changing position. It can sometimes cause breast tenderness or an irregular period in premenopausal women. It can also increase potassium. The increase in potassium usually is not a concern unless you are taking other medicines that also increase potassium, so please be sure your doctor knows all of the other medications you are taking. This medication should not be taken by pregnant women.  This medicine should also not be taken together with sulfa drugs like Bactrim (trimethoprim/sulfamethexazole).      Return if symptoms worsen or fail to improve.  I, Jill Parcell, CMA, am acting as scribe for Rexene Rattler, MD.   Documentation: I have reviewed the above documentation for accuracy and completeness, and I agree with the above.  Rexene Rattler, MD

## 2024-01-20 NOTE — Patient Instructions (Addendum)
 Purpura/Bruising on arms: Can use OTC arnica containing moisturizer such as Dermend Bruise Formula if desired     Seborrheic keratoses at ankles: Recommend starting moisturizer with exfoliant (Urea, Salicylic acid, or Lactic acid) one to two times daily to help smooth rough and bumpy skin.  OTC options include Cetaphil Rough and Bumpy lotion (Urea), Eucerin Roughness Relief lotion or spot treatment cream (Urea), CeraVe SA lotion/cream for Rough and Bumpy skin (Sal Acid), Gold Bond Rough and Bumpy cream (Sal Acid), and AmLactin 12% lotion/cream (Lactic Acid).  If applying in morning, also apply sunscreen to sun-exposed areas, since these exfoliating moisturizers can increase sensitivity to sun.  Acne: Start generic Duac (benzoyl peroxide/clindamycin ) gel to face to spot treat acne on face.   Recommend daily broad spectrum sunscreen SPF 30+ to sun-exposed areas, reapply every 2 hours as needed. Call for new or changing lesions.  Staying in the shade or wearing long sleeves, sun glasses (UVA+UVB protection) and wide brim hats (4-inch brim around the entire circumference of the hat) are also recommended for sun protection.      Melanoma ABCDEs  Melanoma is the most dangerous type of skin cancer, and is the leading cause of death from skin disease.  You are more likely to develop melanoma if you: Have light-colored skin, light-colored eyes, or red or blond hair Spend a lot of time in the sun Tan regularly, either outdoors or in a tanning bed Have had blistering sunburns, especially during childhood Have a close family member who has had a melanoma Have atypical moles or large birthmarks  Early detection of melanoma is key since treatment is typically straightforward and cure rates are extremely high if we catch it early.   The first sign of melanoma is often a change in a mole or a new dark spot.  The ABCDE system is a way of remembering the signs of melanoma.  A for asymmetry:  The two  halves do not match. B for border:  The edges of the growth are irregular. C for color:  A mixture of colors are present instead of an even brown color. D for diameter:  Melanomas are usually (but not always) greater than 6mm - the size of a pencil eraser. E for evolution:  The spot keeps changing in size, shape, and color.  Please check your skin once per month between visits. You can use a small mirror in front and a large mirror behind you to keep an eye on the back side or your body.   If you see any new or changing lesions before your next follow-up, please call to schedule a visit.  Please continue daily skin protection including broad spectrum sunscreen SPF 30+ to sun-exposed areas, reapplying every 2 hours as needed when you're outdoors.   Staying in the shade or wearing long sleeves, sun glasses (UVA+UVB protection) and wide brim hats (4-inch brim around the entire circumference of the hat) are also recommended for sun protection.      Due to recent changes in healthcare laws, you may see results of your pathology and/or laboratory studies on MyChart before the doctors have had a chance to review them. We understand that in some cases there may be results that are confusing or concerning to you. Please understand that not all results are received at the same time and often the doctors may need to interpret multiple results in order to provide you with the best plan of care or course of treatment. Therefore, we ask that  you please give us  2 business days to thoroughly review all your results before contacting the office for clarification. Should we see a critical lab result, you will be contacted sooner.   If You Need Anything After Your Visit  If you have any questions or concerns for your doctor, please call our main line at [phone] and press option 4 to reach your doctor's medical assistant. If no one answers, please leave a voicemail as directed and we will return your call as  soon as possible. Messages left after 4 pm will be answered the following business day.   You may also send us  a message via MyChart. We typically respond to MyChart messages within 1-2 business days.  For prescription refills, please ask your pharmacy to contact our office. Our fax number is [phone].  If you have an urgent issue when the clinic is closed that cannot wait until the next business day, you can page your doctor at the number below.    Please note that while we do our best to be available for urgent issues outside of office hours, we are not available 24/7.   If you have an urgent issue and are unable to reach us , you may choose to seek medical care at your doctor's office, retail clinic, urgent care center, or emergency room.  If you have a medical emergency, please immediately call 911 or go to the emergency department.  Pager Numbers  - Dr. Hester: [phone]  - Dr. Jackquline: [phone]  - Dr. Claudene: [phone]   - Dr. Raymund: [phone]  In the event of inclement weather, please call our main line at [phone] for an update on the status of any delays or closures.  Dermatology Medication Tips: Please keep the boxes that topical medications come in in order to help keep track of the instructions about where and how to use these. Pharmacies typically print the medication instructions only on the boxes and not directly on the medication tubes.   If your medication is too expensive, please contact our office at [phone] option 4 or send us  a message through MyChart.   We are unable to tell what your co-pay for medications will be in advance as this is different depending on your insurance coverage. However, we may be able to find a substitute medication at lower cost or fill out paperwork to get insurance to cover a needed medication.   If a prior authorization is required to get your medication covered by your insurance company, please allow us  1-2  business days to complete this process.  Drug prices often vary depending on where the prescription is filled and some pharmacies may offer cheaper prices.  The website www.goodrx.com contains coupons for medications through different pharmacies. The prices here do not account for what the cost may be with help from insurance (it may be cheaper with your insurance), but the website can give you the price if you did not use any insurance.  - You can print the associated coupon and take it with your prescription to the pharmacy.  - You may also stop by our office during regular business hours and pick up a GoodRx coupon card.  - If you need your prescription sent electronically to a different pharmacy, notify our office through [facility] or by phone at [phone] option 4.     Si Usted Necesita Algo Despus de Su Visita  Tambin puede enviarnos un mensaje a travs de Clinical cytogeneticist. Por lo general respondemos a los  mensajes de MyChart en el transcurso de 1 a 2 das hbiles.  Para renovar recetas, por favor pida a su farmacia que se ponga en contacto con nuestra oficina. Randi lakes de fax es [place] [phone].  Si tiene un asunto urgente cuando la clnica est cerrada y que no puede esperar hasta el siguiente da hbil, puede llamar/localizar a su doctor(a) al nmero que aparece a continuacin.   Por favor, tenga en cuenta que aunque hacemos todo lo posible para estar disponibles para asuntos urgentes fuera del horario de [place], no estamos disponibles las 24 horas del da, los 7 [address]  [address] de la [place].   Si tiene un problema urgente y no puede comunicarse con nosotros, puede optar por buscar atencin mdica  en el consultorio de su doctor(a), en una clnica privada, en un centro de atencin urgente o en una sala de emergencias.  Si tiene Engineer, drilling, por favor llame inmediatamente al 911 o vaya a la sala de emergencias.  Nmeros de bper  - Dr. Hester: [phone]  - Dra.  Jackquline: [phone]  - Dr. Claudene: [phone]  - Dra. Kitts: [phone]  En caso de inclemencias del [place], por favor llame a nuestra lnea principal al [phone] para una actualizacin sobre el estado de cualquier retraso o cierre.  Consejos para la medicacin en dermatologa: Por favor, guarde las cajas en las que vienen los medicamentos de uso tpico para ayudarle a seguir las instrucciones sobre dnde y cmo usarlos. Las farmacias generalmente imprimen las instrucciones del medicamento slo en las cajas y no directamente en los tubos del [place].   Si su medicamento es muy caro, por favor, pngase en contacto con landry rieger llamando al [phone] y presione la opcin 4 o envenos un mensaje a travs de Clinical cytogeneticist.   No podemos decirle cul ser su copago por los medicamentos por adelantado ya que esto es diferente dependiendo de la cobertura de su seguro. Sin embargo, es posible que podamos encontrar un medicamento sustituto a Audiological scientist un formulario para que el seguro cubra el medicamento que se considera necesario.   Si se requiere una autorizacin previa para que su compaa de seguros malta su medicamento, por favor permtanos de 1 a 2 das hbiles para completar este proceso.  Los precios de los medicamentos varan con frecuencia dependiendo del Environmental consultant de dnde se surte la receta y alguna farmacias pueden ofrecer precios ms baratos.  El sitio web www.goodrx.com tiene cupones para medicamentos de Health and safety inspector. Los precios aqu no tienen en cuenta lo que podra costar con la ayuda del seguro (puede ser ms barato con su seguro), pero el sitio web puede darle el precio si no utiliz Tourist information centre manager.  - Puede imprimir el cupn correspondiente y llevarlo con su receta a la farmacia.  - Tambin puede pasar por nuestra oficina durante el horario de atencin regular y Education officer, museum una tarjeta de cupones de GoodRx.  - Si necesita que su receta se enve  electrnicamente a una farmacia diferente, informe a nuestra oficina a travs de MyChart de [place] o por telfono llamando al [phone] y presione la opcin 4.

## 2024-01-22 ENCOUNTER — Telehealth: Payer: Self-pay

## 2024-01-22 MED ORDER — CLINDAMYCIN PHOS-BENZOYL PEROX 1.2-5 % EX GEL: CUTANEOUS | 2 refills | Status: AC

## 2024-01-22 NOTE — Telephone Encounter (Signed)
 Original prescription was missing strength. aw

## 2024-01-28 DIAGNOSIS — S32010D Wedge compression fracture of first lumbar vertebra, subsequent encounter for fracture with routine healing: Secondary | ICD-10-CM | POA: Diagnosis not present

## 2024-01-28 DIAGNOSIS — M4807 Spinal stenosis, lumbosacral region: Secondary | ICD-10-CM | POA: Diagnosis not present

## 2024-01-28 DIAGNOSIS — M48062 Spinal stenosis, lumbar region with neurogenic claudication: Secondary | ICD-10-CM | POA: Diagnosis not present

## 2024-01-29 ENCOUNTER — Other Ambulatory Visit: Payer: Self-pay | Admitting: Orthopedic Surgery

## 2024-01-29 DIAGNOSIS — M4807 Spinal stenosis, lumbosacral region: Secondary | ICD-10-CM

## 2024-02-07 ENCOUNTER — Ambulatory Visit
Admission: RE | Admit: 2024-02-07 | Discharge: 2024-02-07 | Disposition: A | Discharge status: Home / Self Care | Source: Ambulatory Visit | Attending: Orthopedic Surgery | Admitting: Orthopedic Surgery

## 2024-02-07 DIAGNOSIS — M5137 Other intervertebral disc degeneration, lumbosacral region with discogenic back pain only: Secondary | ICD-10-CM | POA: Diagnosis not present

## 2024-02-07 DIAGNOSIS — M48061 Spinal stenosis, lumbar region without neurogenic claudication: Secondary | ICD-10-CM | POA: Diagnosis not present

## 2024-02-07 DIAGNOSIS — M47816 Spondylosis without myelopathy or radiculopathy, lumbar region: Secondary | ICD-10-CM | POA: Diagnosis not present

## 2024-02-07 DIAGNOSIS — M4807 Spinal stenosis, lumbosacral region: Secondary | ICD-10-CM | POA: Insufficient documentation

## 2024-02-07 DIAGNOSIS — M5136 Other intervertebral disc degeneration, lumbar region with discogenic back pain only: Secondary | ICD-10-CM | POA: Diagnosis not present

## 2024-02-09 ENCOUNTER — Other Ambulatory Visit: Payer: Self-pay | Admitting: Internal Medicine

## 2024-02-10 ENCOUNTER — Other Ambulatory Visit: Payer: Self-pay | Admitting: Internal Medicine

## 2024-02-10 DIAGNOSIS — G43009 Migraine without aura, not intractable, without status migrainosus: Secondary | ICD-10-CM

## 2024-02-10 NOTE — Telephone Encounter (Signed)
 Requested Prescriptions  Pending Prescriptions Disp Refills   pantoprazole  (PROTONIX ) 40 MG tablet [Pharmacy Med Name: Pantoprazole  Sodium 40 MG Oral Tablet Delayed Release] 200 tablet 1    Sig: TAKE 1 TABLET BY MOUTH TWICE  DAILY     Gastroenterology: Proton Pump Inhibitors Passed - 02/10/2024 12:37 PM      Passed - Valid encounter within last 12 months    Recent Outpatient Visits           5 months ago Essential hypertension   [place] [facility] at [facility], Leita DEL, MD       Future Appointments             In 1 week Justus, Leita DEL, MD [facility] at [facility], [phone] [place]

## 2024-02-11 NOTE — Telephone Encounter (Signed)
 Requested medication (s) are due for refill today: yes  Requested medication (s) are on the active medication list: yes  Last refill:  08/18/23  Future visit scheduled: yes  Notes to clinic:  Unable to refill per protocol, cannot delegate.      Requested Prescriptions  Pending Prescriptions Disp Refills   promethazine  (PHENERGAN ) 25 MG tablet [Pharmacy Med Name: Promethazine  HCl 25 MG Oral Tablet] 60 tablet 0    Sig: TAKE 1 TABLET BY MOUTH EVERY 8 HOURS AS NEEDED FOR NAUSEA FOR VOMITING     Not Delegated - Gastroenterology: Antiemetics Failed - 02/11/2024  4:10 PM      Failed - This refill cannot be delegated      Passed - Valid encounter within last 6 months    Recent Outpatient Visits           5 months ago Essential hypertension   [place] [facility] at [facility], Leita DEL, MD       Future Appointments             In 1 week Justus, Leita DEL, MD [facility] at [facility], [phone] [place]

## 2024-02-17 ENCOUNTER — Encounter: Payer: Self-pay | Admitting: Internal Medicine

## 2024-02-18 ENCOUNTER — Encounter: Payer: Self-pay | Admitting: Internal Medicine

## 2024-02-18 ENCOUNTER — Ambulatory Visit (INDEPENDENT_AMBULATORY_CARE_PROVIDER_SITE_OTHER): Admitting: Internal Medicine

## 2024-02-18 VITALS — BP 112/64 | HR 73 | Ht 64.0 in | Wt 179.0 lb

## 2024-02-18 DIAGNOSIS — G43009 Migraine without aura, not intractable, without status migrainosus: Secondary | ICD-10-CM | POA: Diagnosis not present

## 2024-02-18 DIAGNOSIS — E034 Atrophy of thyroid (acquired): Secondary | ICD-10-CM | POA: Diagnosis not present

## 2024-02-18 DIAGNOSIS — R7303 Prediabetes: Secondary | ICD-10-CM

## 2024-02-18 DIAGNOSIS — M48061 Spinal stenosis, lumbar region without neurogenic claudication: Secondary | ICD-10-CM | POA: Diagnosis not present

## 2024-02-18 DIAGNOSIS — I10 Essential (primary) hypertension: Secondary | ICD-10-CM

## 2024-02-18 DIAGNOSIS — E782 Mixed hyperlipidemia: Secondary | ICD-10-CM

## 2024-02-18 DIAGNOSIS — Z0000 Encounter for general adult medical examination without abnormal findings: Secondary | ICD-10-CM

## 2024-02-18 DIAGNOSIS — Z1231 Encounter for screening mammogram for malignant neoplasm of breast: Secondary | ICD-10-CM | POA: Diagnosis not present

## 2024-02-18 DIAGNOSIS — M797 Fibromyalgia: Secondary | ICD-10-CM | POA: Diagnosis not present

## 2024-02-18 MED ORDER — BUTALBITAL-APAP-CAFF-COD 50-325-40-30 MG PO CAPS: 1.0000 | ORAL_CAPSULE | Freq: Four times a day (QID) | ORAL | 0 refills | Status: DC | PRN

## 2024-02-18 MED ORDER — DICLOFENAC SODIUM 50 MG PO TBEC: 50.0000 mg | DELAYED_RELEASE_TABLET | Freq: Two times a day (BID) | ORAL | 0 refills | Status: DC

## 2024-02-18 NOTE — Assessment & Plan Note (Signed)
 Having more pain since her fall in December with spinal fracture. She has been referred to Pain management to consider ESI, etc She has used Voltaren  50 mg bid with some benefit and would like a refill.

## 2024-02-18 NOTE — Assessment & Plan Note (Signed)
 Blood pressure is well controlled on propranolol  and lasix . No medication side effects noted. Plan to continue current medications.

## 2024-02-18 NOTE — Assessment & Plan Note (Signed)
 Managed with PRN Fioricet with codeine. She uses about 60 tabs every 3-4 months.

## 2024-02-18 NOTE — Progress Notes (Signed)
 Date:  02/18/2024   Name:  Ashley Glover   DOB:  02/17/1961   MRN:  [id number]   Chief Complaint: Annual Exam Ashley Glover is a 63 y.o. female who presents today for her Complete Annual Exam. She feels fairly well. She reports exercising some. She reports she is sleeping poorly. Breast complaints - none.  Health Maintenance  Topic Date Due   HIV Screening  Never done   Pneumococcal Vaccine for age over 45 (1 of 1 - PCV) Never done   COVID-19 Vaccine (1) 03/05/2024*   Flu Shot  08/24/2024*   Medicare Annual Wellness Visit  05/13/2024   Breast Cancer Screening  09/02/2024   Colon Cancer Screening  04/27/2028   DTaP/Tdap/Td vaccine (2 - Td or Tdap) 09/20/2031   Hepatitis C Screening  Completed   Zoster (Shingles) Vaccine  Completed   Hepatitis B Vaccine  Aged Out   HPV Vaccine  Aged Out   Meningitis B Vaccine  Aged Out  *Topic was postponed. The date shown is not the original due date.    Hypertension Pertinent negatives include no chest pain, headaches, palpitations or shortness of breath. Identifiable causes of hypertension include a thyroid  problem.  Hyperlipidemia Associated symptoms include myalgias. Pertinent negatives include no chest pain or shortness of breath.  Thyroid  Problem Patient reports no anxiety, constipation, diarrhea, fatigue or palpitations. Her past medical history is significant for hyperlipidemia.  Back Pain This is a chronic problem. The problem occurs daily. The pain is present in the lumbar spine. Pertinent negatives include no abdominal pain, chest pain, headaches or weakness. She has tried muscle relaxant (and gabapentin ) for the symptoms.  Fibromyalgia - long standing chronic symptoms managed with Cymbalta , muscle relaxants and gabapentin .  Review of Systems  Constitutional:  Negative for fatigue and unexpected weight change.  HENT:  Negative for trouble swallowing.   Eyes:  Negative for visual disturbance.  Respiratory:  Negative for cough,  chest tightness, shortness of breath and wheezing.   Cardiovascular:  Negative for chest pain, palpitations and leg swelling.  Gastrointestinal:  Negative for abdominal pain, constipation and diarrhea.  Genitourinary:  Negative for frequency and urgency.       Ruptured breast implant - planning removal early 2026  Musculoskeletal:  Positive for arthralgias, back pain and myalgias.  Neurological:  Negative for dizziness, weakness, light-headedness and headaches.  Psychiatric/Behavioral:  Positive for sleep disturbance. Negative for dysphoric mood. The patient is not nervous/anxious.      Lab Results  Component Value Date   NA 145 (H) 12/24/2022   K 4.9 12/24/2022   CO2 29 12/24/2022   GLUCOSE 100 (H) 12/24/2022   BUN 10 12/24/2022   CREATININE 0.78 12/24/2022   CALCIUM 9.3 12/24/2022   EGFR 85 12/24/2022   GFRNONAA >60 08/24/2020   Lab Results  Component Value Date   CHOL 158 12/24/2022   HDL 61 12/24/2022   LDLCALC 68 12/24/2022   TRIG 174 (H) 12/24/2022   CHOLHDL 2.6 12/24/2022   Lab Results  Component Value Date   TSH 3.380 12/24/2022   Lab Results  Component Value Date   HGBA1C 6.1 (H) 12/24/2022   Lab Results  Component Value Date   WBC 10.5 12/24/2022   HGB 14.2 12/24/2022   HCT 45.6 12/24/2022   MCV 92 12/24/2022   PLT 276 12/24/2022   Lab Results  Component Value Date   ALT 54 (H) 12/24/2022   AST 29 12/24/2022   ALKPHOS 115 12/24/2022  BILITOT 0.3 12/24/2022   Lab Results  Component Value Date   VD25OH 57.3 12/24/2022     Patient Active Problem List   Diagnosis Date Noted   Spinal stenosis of lumbar region without neurogenic claudication 02/18/2024   Status post revision of total knee replacement, right 10/03/2023   S/P breast biopsy 09/11/2023   Polyp of ascending colon 04/28/2023   Prediabetes 03/01/2022   Aortic root dilatation 01/16/2022   S/P total hysterectomy 03/16/2020   Dermatitis of both ear canals 03/01/2020   Pruritus 01/25/2020    Insomnia 06/25/2019   Osteopenia determined by x-ray 06/23/2018   Migraine without aura and without status migrainosus, not intractable 05/21/2017   Not currently working due to disabled status 01/24/2017   Frequent PVCs 08/20/2016   Periodic headache syndrome, not intractable 01/31/2016   Rosacea, acne 01/31/2016   Essential hypertension 08/02/2015   Hypothyroidism due to acquired atrophy of thyroid  08/02/2015   Fibromyalgia 08/02/2015   Sjogren's syndrome 08/02/2015   Peptic ulcer disease 08/02/2015   Other allergic rhinitis 08/02/2015   Hyperlipidemia, mixed 08/02/2015   Primary osteoarthritis of left knee 08/02/2015    Allergies  Allergen Reactions   Ace Inhibitors Cough   Ketamine      Pt did  not like the way it made her feel   Morphine And Codeine Nausea Only   Savella [Milnacipran Hcl] Palpitations    Past Surgical History:  Procedure Laterality Date   AUGMENTATION MAMMAPLASTY Bilateral 1987   BREAST BIOPSY Right 09/10/2023   MM RT BREAST BX W LOC DEV 1ST LESION IMAGE BX SPEC STEREO GUIDE 09/10/2023 [facility]   COLONOSCOPY  05/2012   polyps   COLONOSCOPY WITH PROPOFOL  N/A 06/20/2016   Procedure: COLONOSCOPY WITH PROPOFOL ;  Surgeon: Rogelia Copping, MD;  Location: [facility];  Service: Endoscopy;  Laterality: N/A;   COLONOSCOPY WITH PROPOFOL  N/A 11/11/2019   Procedure: COLONOSCOPY WITH PROPOFOL ;  Surgeon: Copping Rogelia, MD;  Location: [facility];  Service: Endoscopy;  Laterality: N/A;  3   COLONOSCOPY WITH PROPOFOL  N/A 03/06/2020   Procedure: COLONOSCOPY WITH BIOPSY;  Surgeon: Copping Rogelia, MD;  Location: [facility];  Service: Endoscopy;  Laterality: N/A;   COLONOSCOPY WITH PROPOFOL  N/A 04/28/2023   Procedure: COLONOSCOPY WITH BIOPSY;  Surgeon: Copping Rogelia, MD;  Location: [facility];  Service: Endoscopy;  Laterality: N/A;   ESOPHAGOGASTRODUODENOSCOPY (EGD) WITH PROPOFOL  N/A 06/20/2016   Procedure: ESOPHAGOGASTRODUODENOSCOPY (EGD)  WITH PROPOFOL ;  Surgeon: Rogelia Copping, MD;  Location: [facility];  Service: Endoscopy;  Laterality: N/A;   ESOPHAGOGASTRODUODENOSCOPY (EGD) WITH PROPOFOL  N/A 11/11/2019   Procedure: ESOPHAGOGASTRODUODENOSCOPY (EGD) WITH PROPOFOL ;  Surgeon: Copping Rogelia, MD;  Location: [facility];  Service: Endoscopy;  Laterality: N/A;   ESOPHAGOGASTRODUODENOSCOPY (EGD) WITH PROPOFOL  N/A 03/06/2020   Procedure: ESOPHAGOGASTRODUODENOSCOPY (EGD) WITH BIOPSY;  Surgeon: Copping Rogelia, MD;  Location: [facility];  Service: Endoscopy;  Laterality: N/A;   ESOPHAGOGASTRODUODENOSCOPY (EGD) WITH PROPOFOL  N/A 04/28/2023   Procedure: ESOPHAGOGASTRODUODENOSCOPY (EGD) WITH BIOPSY;  Surgeon: Copping Rogelia, MD;  Location: [facility];  Service: Endoscopy;  Laterality: N/A;   FOOT SURGERY Right    GANGLION CYST EXCISION Left    NASAL SINUS SURGERY     NEUROMA SURGERY Left    OPEN REDUCTION INTERNAL FIXATION (ORIF) DISTAL RADIAL FRACTURE Left 08/24/2020   Procedure: OPEN REDUCTION INTERNAL FIXATION (ORIF) DISTAL RADIAL FRACTURE;  Surgeon: Edie Norleen PARAS, MD;  Location: [facility];  Service: Orthopedics;  Laterality: Left;   OPEN REDUCTION INTERNAL FIXATION (ORIF) DISTAL  RADIAL FRACTURE Right 09/13/2022   Procedure: OPEN REDUCTION INTERNAL FIXATION (ORIF) DISTAL RADIUS FRACTURE;  Surgeon: Kathlynn Sharper, MD;  Location: [facility];  Service: Orthopedics;  Laterality: Right;   PLACEMENT OF BREAST IMPLANTS     POLYPECTOMY  06/20/2016   Procedure: POLYPECTOMY;  Surgeon: Rogelia Copping, MD;  Location: [facility];  Service: Endoscopy;;   POLYPECTOMY N/A 03/06/2020   Procedure: POLYPECTOMY;  Surgeon: Copping Rogelia, MD;  Location: [facility];  Service: Endoscopy;  Laterality: N/A;   POLYPECTOMY  04/28/2023   Procedure: POLYPECTOMY;  Surgeon: Copping Rogelia, MD;  Location: [facility];  Service: Endoscopy;;   REPLACEMENT TOTAL KNEE Right 06/2014   TONSILLECTOMY     TOTAL VAGINAL  HYSTERECTOMY  1996   cervical dysplasia    Social History   Tobacco Use   Smoking status: Never   Smokeless tobacco: Never  Vaping Use   Vaping status: Never Used  Substance Use Topics   Alcohol use: No    Alcohol/week: 0.0 standard drinks of alcohol   Drug use: No     Medication list has been reviewed and updated.  Current Meds  Medication Sig   alendronate  (FOSAMAX ) 70 MG tablet Take 1 tablet (70 mg total) by mouth every 7 (seven) days. Take with a full glass of water  on an empty stomach.   Calcium Carb-Cholecalciferol (CALCIUM 600 + D PO) Take by mouth daily.   Clindamycin -Benzoyl Per, Refr, gel Apply 1 GRAM once or twice daily to affected areas of acne on face.   cyclobenzaprine  (FLEXERIL ) 10 MG tablet Take 1 tablet (10 mg total) by mouth 3 (three) times daily as needed for muscle spasms.   diclofenac  (VOLTAREN ) 50 MG EC tablet Take 1 tablet (50 mg total) by mouth 2 (two) times daily.   DULoxetine  (CYMBALTA ) 60 MG capsule TAKE 1 CAPSULE BY MOUTH DAILY   furosemide  (LASIX ) 40 MG tablet TAKE 1 TABLET BY MOUTH DAILY   gabapentin  (NEURONTIN ) 600 MG tablet Take 1 tablet (600 mg total) by mouth 3 (three) times daily.   hydrochlorothiazide  (HYDRODIURIL ) 12.5 MG tablet Take 12.5 mg by mouth daily.   hydrocortisone  2.5 % cream APPLY TOPICALLY TO AFFECTED  AREA(S) TWICE DAILY   levothyroxine  (SYNTHROID ) 88 MCG tablet TAKE 1 TABLET BY MOUTH DAILY  BEFORE BREAKFAST   mometasone  (ELOCON ) 0.1 % lotion Apply topically daily. To both ears as needed   Multiple Vitamin (MULTIVITAMIN ADULT PO) Take 1 tablet by mouth daily.   Omega-3 Fatty Acids (FISH OIL PO) Take by mouth.   pantoprazole  (PROTONIX ) 40 MG tablet TAKE 1 TABLET BY MOUTH TWICE  DAILY   promethazine  (PHENERGAN ) 25 MG tablet TAKE 1 TABLET BY MOUTH EVERY 8 HOURS AS NEEDED FOR NAUSEA FOR VOMITING   propranolol  (INDERAL ) 40 MG tablet Take 1 tablet (40 mg total) by mouth 2 (two) times daily.   rosuvastatin (CRESTOR) 20 MG tablet Take  20 mg by mouth at bedtime.   tiZANidine  (ZANAFLEX ) 4 MG tablet TAKE 1 TABLET BY MOUTH 3 TIMES  DAILY   [DISCONTINUED] butalbital -apap-caffeine-codeine (FIORICET WITH CODEINE) 50-325-40-30 MG capsule Take 1-2 capsules by mouth every 6 (six) hours as needed for headache. Maximum 6 caps per day.       02/18/2024    9:14 AM 08/18/2023    9:26 AM 04/01/2023   10:23 AM 12/24/2022    9:39 AM  GAD 7 : Generalized Anxiety Score  Nervous, Anxious, on Edge 0 0 0 0  Control/stop worrying 0 0 0 0  Worry too much - different things 0 0 0 0  Trouble relaxing 0 0 0 0  Restless 0 0 0 0  Easily annoyed or irritable 0 0 0 0  Afraid - awful might happen 0 0 0 0  Total GAD 7 Score 0 0 0 0  Anxiety Difficulty Not difficult at all Not difficult at all Not difficult at all Not difficult at all       02/18/2024    9:14 AM 08/18/2023    9:26 AM 05/14/2023    9:06 AM  Depression screen PHQ 2/9  Decreased Interest 0 0 0  Down, Depressed, Hopeless 0 0 0  PHQ - 2 Score 0 0 0  Altered sleeping 0 0 0  Tired, decreased energy 0 0 0  Change in appetite 0 0 0  Feeling bad or failure about yourself  0 0 0  Trouble concentrating 0 0 0  Moving slowly or fidgety/restless 0 0 0  Suicidal thoughts 0 0 0  PHQ-9 Score 0 0 0  Difficult doing work/chores Not difficult at all Not difficult at all Not difficult at all    BP Readings from Last 3 Encounters:  02/18/24 112/64  08/18/23 112/70  04/28/23 100/70    Physical Exam Vitals and nursing note reviewed.  Constitutional:      General: She is not in acute distress.    Appearance: She is well-developed.  HENT:     Head: Normocephalic and atraumatic.     Right Ear: Tympanic membrane and ear canal normal.     Left Ear: Tympanic membrane and ear canal normal.     Nose:     Right Sinus: No maxillary sinus tenderness.     Left Sinus: No maxillary sinus tenderness.  Eyes:     General: No scleral icterus.       Right eye: No discharge.        Left eye: No  discharge.     Conjunctiva/sclera: Conjunctivae normal.  Neck:     Thyroid : No thyromegaly.     Vascular: No carotid bruit.  Cardiovascular:     Rate and Rhythm: Normal rate and regular rhythm.     Pulses: Normal pulses.     Heart sounds: Normal heart sounds.  Pulmonary:     Effort: Pulmonary effort is normal. No respiratory distress.     Breath sounds: No wheezing.  Abdominal:     General: Bowel sounds are normal.     Palpations: Abdomen is soft.     Tenderness: There is no abdominal tenderness.  Musculoskeletal:     Cervical back: Normal range of motion. No erythema.     Right lower leg: No edema.     Left lower leg: No edema.  Lymphadenopathy:     Cervical: No cervical adenopathy.  Skin:    General: Skin is warm and dry.     Findings: No rash.  Neurological:     Mental Status: She is alert and oriented to person, place, and time.     Cranial Nerves: No cranial nerve deficit.     Sensory: No sensory deficit.     Deep Tendon Reflexes: Reflexes are normal and symmetric.  Psychiatric:        Attention and Perception: Attention normal.        Mood and Affect: Mood normal.     Wt Readings from Last 3 Encounters:  02/18/24 179 lb (81.2 kg)  08/18/23 182 lb (82.6 kg)  04/28/23 190 lb (86.2 kg)  BP 112/64   Pulse 73   Ht 5' 4 (1.626 m)   Wt 179 lb (81.2 kg)   SpO2 93%   BMI 30.73 kg/m   Assessment and Plan:  Problem List Items Addressed This Visit       Unprioritized   Essential hypertension (Chronic)   Blood pressure is well controlled on propranolol  and lasix . No medication side effects noted. Plan to continue current medications.       Relevant Orders   CBC with Differential/Platelet   Comprehensive metabolic panel with GFR   Urinalysis, Routine w reflex microscopic   Fibromyalgia (Chronic)   Symptoms stable on Tizanidine , gabapentin  and Cymbalta .      Relevant Medications   butalbital -apap-caffeine-codeine (FIORICET WITH CODEINE) 50-325-40-30  MG capsule   diclofenac  (VOLTAREN ) 50 MG EC tablet   Hyperlipidemia, mixed (Chronic)   LDL is  Lab Results  Component Value Date   LDLCALC 68 12/24/2022   Currently taking Crestor.  No medication side effects or other concerns. Recommended LDL goal is < 70.       Relevant Orders   Lipid panel   Hypothyroidism due to acquired atrophy of thyroid  (Chronic)   supplemented      Relevant Orders   TSH + free T4   Migraine without aura and without status migrainosus, not intractable (Chronic)   Managed with PRN Fioricet with codeine. She uses about 60 tabs every 3-4 months.      Relevant Medications   butalbital -apap-caffeine-codeine (FIORICET WITH CODEINE) 50-325-40-30 MG capsule   diclofenac  (VOLTAREN ) 50 MG EC tablet   Prediabetes (Chronic)   Lab Results  Component Value Date   HGBA1C 6.1 (H) 12/24/2022  Recommend low carb diet only. No medications needed yet.       Relevant Orders   Hemoglobin A1c   Spinal stenosis of lumbar region without neurogenic claudication   Having more pain since her fall in December with spinal fracture. She has been referred to Pain management to consider ESI, etc She has used Voltaren  50 mg bid with some benefit and would like a refill.      Relevant Medications   diclofenac  (VOLTAREN ) 50 MG EC tablet   Other Visit Diagnoses       Annual physical exam    -  Primary   continue wieght loss efforts exercise as able she declines all vaccines today   Relevant Orders   CBC with Differential/Platelet   Comprehensive metabolic panel with GFR   Hemoglobin A1c   Lipid panel   TSH + free T4   Urinalysis, Routine w reflex microscopic     Encounter for screening mammogram for breast cancer       planning to have breast implants removed in the spring and will have mammogram after that       Return in about 6 months (around 08/17/2024) for TOC , HTN, migraine, Fibromyalgia  Dr. Lemon.    Leita HILARIO Adie, MD [facility] [place]

## 2024-02-18 NOTE — Assessment & Plan Note (Signed)
 LDL is  Lab Results  Component Value Date   LDLCALC 68 12/24/2022   Currently taking Crestor.  No medication side effects or other concerns. Recommended LDL goal is < 70.

## 2024-02-18 NOTE — Assessment & Plan Note (Signed)
 Symptoms stable on Tizanidine , gabapentin  and Cymbalta .

## 2024-02-18 NOTE — Assessment & Plan Note (Signed)
 Lab Results  Component Value Date   HGBA1C 6.1 (H) 12/24/2022  Recommend low carb diet only. No medications needed yet.

## 2024-02-18 NOTE — Assessment & Plan Note (Signed)
 supplemented

## 2024-02-19 ENCOUNTER — Ambulatory Visit: Payer: Self-pay | Admitting: Internal Medicine

## 2024-02-19 LAB — URINALYSIS, ROUTINE W REFLEX MICROSCOPIC
Bilirubin, UA: NEGATIVE
Glucose, UA: NEGATIVE
Ketones, UA: NEGATIVE
Nitrite, UA: NEGATIVE
Protein,UA: NEGATIVE
RBC, UA: NEGATIVE
Specific Gravity, UA: 1.012 (ref 1.005–1.030)
Urobilinogen, Ur: 0.2 mg/dL (ref 0.2–1.0)
pH, UA: 6 (ref 5.0–7.5)

## 2024-02-19 LAB — CBC WITH DIFFERENTIAL/PLATELET
Basophils Absolute: 0.1 x10E3/uL (ref 0.0–0.2)
Basos: 1 %
EOS (ABSOLUTE): 0.2 x10E3/uL (ref 0.0–0.4)
Eos: 2 %
Hematocrit: 46.7 % — ABNORMAL HIGH (ref 34.0–46.6)
Hemoglobin: 14.7 g/dL (ref 11.1–15.9)
Immature Grans (Abs): 0 x10E3/uL (ref 0.0–0.1)
Immature Granulocytes: 0 %
Lymphocytes Absolute: 3.2 x10E3/uL — ABNORMAL HIGH (ref 0.7–3.1)
Lymphs: 28 %
MCH: 28.2 pg (ref 26.6–33.0)
MCHC: 31.5 g/dL (ref 31.5–35.7)
MCV: 90 fL (ref 79–97)
Monocytes Absolute: 1 x10E3/uL — ABNORMAL HIGH (ref 0.1–0.9)
Monocytes: 9 %
Neutrophils Absolute: 6.6 x10E3/uL (ref 1.4–7.0)
Neutrophils: 60 %
Platelets: 310 x10E3/uL (ref 150–450)
RBC: 5.21 x10E6/uL (ref 3.77–5.28)
RDW: 12.2 % (ref 11.7–15.4)
WBC: 11.1 x10E3/uL — ABNORMAL HIGH (ref 3.4–10.8)

## 2024-02-19 LAB — LIPID PANEL
Chol/HDL Ratio: 6.1 ratio — ABNORMAL HIGH (ref 0.0–4.4)
Cholesterol, Total: 328 mg/dL — ABNORMAL HIGH (ref 100–199)
HDL: 54 mg/dL (ref >39)
LDL Chol Calc (NIH): 223 mg/dL — ABNORMAL HIGH (ref 0–99)
Triglycerides: 253 mg/dL — ABNORMAL HIGH (ref 0–149)
VLDL Cholesterol Cal: 51 mg/dL — ABNORMAL HIGH (ref 5–40)

## 2024-02-19 LAB — COMPREHENSIVE METABOLIC PANEL WITH GFR
ALT: 25 IU/L (ref 0–32)
AST: 28 IU/L (ref 0–40)
Albumin: 4.5 g/dL (ref 3.9–4.9)
Alkaline Phosphatase: 126 IU/L (ref 49–135)
BUN/Creatinine Ratio: 19 (ref 12–28)
BUN: 16 mg/dL (ref 8–27)
Bilirubin Total: 0.3 mg/dL (ref 0.0–1.2)
CO2: 24 mmol/L (ref 20–29)
Calcium: 9.6 mg/dL (ref 8.7–10.3)
Chloride: 94 mmol/L — ABNORMAL LOW (ref 96–106)
Creatinine, Ser: 0.83 mg/dL (ref 0.57–1.00)
Globulin, Total: 2.7 g/dL (ref 1.5–4.5)
Glucose: 98 mg/dL (ref 70–99)
Potassium: 4.2 mmol/L (ref 3.5–5.2)
Sodium: 135 mmol/L (ref 134–144)
Total Protein: 7.2 g/dL (ref 6.0–8.5)
eGFR: 79 mL/min/1.73 (ref >59)

## 2024-02-19 LAB — TSH+FREE T4
Free T4: 1.19 ng/dL (ref 0.82–1.77)
TSH: 5.62 u[IU]/mL — ABNORMAL HIGH (ref 0.450–4.500)

## 2024-02-19 LAB — MICROSCOPIC EXAMINATION
Bacteria, UA: NONE SEEN
Casts: NONE SEEN /LPF
RBC, Urine: NONE SEEN /HPF (ref 0–2)
WBC, UA: NONE SEEN /HPF (ref 0–5)

## 2024-02-19 LAB — HEMOGLOBIN A1C
Est. average glucose Bld gHb Est-mCnc: 120 mg/dL
Hgb A1c MFr Bld: 5.8 % — ABNORMAL HIGH (ref 4.8–5.6)

## 2024-02-20 ENCOUNTER — Telehealth: Payer: Self-pay | Admitting: Internal Medicine

## 2024-02-20 ENCOUNTER — Other Ambulatory Visit: Payer: Self-pay | Admitting: Internal Medicine

## 2024-02-20 DIAGNOSIS — G43009 Migraine without aura, not intractable, without status migrainosus: Secondary | ICD-10-CM

## 2024-02-20 MED ORDER — BUTALBITAL-APAP-CAFF-COD 50-325-40-30 MG PO CAPS: 1.0000 | ORAL_CAPSULE | Freq: Four times a day (QID) | ORAL | 0 refills | Status: DC | PRN

## 2024-02-20 NOTE — Telephone Encounter (Signed)
 Mailed labs with results, and put note to ask patient to call office to discuss cholesterol.  - Ashley Rowen M.

## 2024-02-20 NOTE — Telephone Encounter (Signed)
 Called and spoke with pt. She said [facility] received Rx for Fioricet and said in order to get the full amount of this Rx we need to send in 21 capsules for the first week to [facility]. Then after a week patient will call and request full Rx to be sent in.  She said if we do not do it this way, the pharmacy automatically flags her as opoid naive.   Please resend Fioricet to [facility] for 21 capsules.  Thanks!

## 2024-02-20 NOTE — Telephone Encounter (Signed)
 Copied from CRM [id number]. Topic: Clinical - Lab/Test Results >> Feb 20, 2024 10:49 AM Selinda RAMAN wrote: Reason for CRM: The patient called in stating she would like a copy of her latest lab work and urinalysis mailed to her house like she usually gets. She says she doesn't do my chart. Please assist patient further.

## 2024-02-20 NOTE — Progress Notes (Unsigned)
 Date:  02/20/2024   Name:  Ashley Glover   DOB:  [birth year]   MRN:  [id number]   Chief Complaint: No chief complaint on file.  HPI  Review of Systems   Lab Results  Component Value Date   NA 135 02/18/2024   K 4.2 02/18/2024   CO2 24 02/18/2024   GLUCOSE 98 02/18/2024   BUN 16 02/18/2024   CREATININE 0.83 02/18/2024   CALCIUM 9.6 02/18/2024   EGFR 79 02/18/2024   GFRNONAA >60 08/24/2020   Lab Results  Component Value Date   CHOL 328 (H) 02/18/2024   HDL 54 02/18/2024   LDLCALC 223 (H) 02/18/2024   TRIG 253 (H) 02/18/2024   CHOLHDL 6.1 (H) 02/18/2024   Lab Results  Component Value Date   TSH 5.620 (H) 02/18/2024   Lab Results  Component Value Date   HGBA1C 5.8 (H) 02/18/2024   Lab Results  Component Value Date   WBC 11.1 (H) 02/18/2024   HGB 14.7 02/18/2024   HCT 46.7 (H) 02/18/2024   MCV 90 02/18/2024   PLT 310 02/18/2024   Lab Results  Component Value Date   ALT 25 02/18/2024   AST 28 02/18/2024   ALKPHOS 126 02/18/2024   BILITOT 0.3 02/18/2024   Lab Results  Component Value Date   VD25OH 57.3 12/24/2022     Patient Active Problem List   Diagnosis Date Noted   Spinal stenosis of lumbar region without neurogenic claudication 02/18/2024   Status post revision of total knee replacement, right 10/03/2023   S/P breast biopsy 09/11/2023   Polyp of ascending colon 04/28/2023   Prediabetes 03/01/2022   Aortic root dilatation 01/16/2022   S/P total hysterectomy 03/16/2020   Dermatitis of both ear canals 03/01/2020   Pruritus 01/25/2020   Insomnia 06/25/2019   Osteopenia determined by x-ray 06/23/2018   Migraine without aura and without status migrainosus, not intractable 05/21/2017   Not currently working due to disabled status 01/24/2017   Frequent PVCs 08/20/2016   Periodic headache syndrome, not intractable 01/31/2016   Rosacea, acne 01/31/2016   Essential hypertension 08/02/2015   Hypothyroidism due to acquired atrophy of thyroid   08/02/2015   Fibromyalgia 08/02/2015   Sjogren's syndrome 08/02/2015   Peptic ulcer disease 08/02/2015   Other allergic rhinitis 08/02/2015   Hyperlipidemia, mixed 08/02/2015   Primary osteoarthritis of left knee 08/02/2015    Allergies  Allergen Reactions   Ace Inhibitors Cough   Ketamine      Pt did  not like the way it made her feel   Morphine And Codeine Nausea Only   Savella [Milnacipran Hcl] Palpitations    Past Surgical History:  Procedure Laterality Date   AUGMENTATION MAMMAPLASTY Bilateral 1987   BREAST BIOPSY Right 09/10/2023   MM RT BREAST BX W LOC DEV 1ST LESION IMAGE BX SPEC STEREO GUIDE 09/10/2023 [facility]   COLONOSCOPY  05/2012   polyps   COLONOSCOPY WITH PROPOFOL  N/A 06/20/2016   Procedure: COLONOSCOPY WITH PROPOFOL ;  Surgeon: Rogelia Copping, MD;  Location: [facility];  Service: Endoscopy;  Laterality: N/A;   COLONOSCOPY WITH PROPOFOL  N/A 11/11/2019   Procedure: COLONOSCOPY WITH PROPOFOL ;  Surgeon: Copping Rogelia, MD;  Location: [facility];  Service: Endoscopy;  Laterality: N/A;  3   COLONOSCOPY WITH PROPOFOL  N/A 03/06/2020   Procedure: COLONOSCOPY WITH BIOPSY;  Surgeon: Copping Rogelia, MD;  Location: [facility];  Service: Endoscopy;  Laterality: N/A;   COLONOSCOPY WITH PROPOFOL  N/A 04/28/2023   Procedure: COLONOSCOPY WITH  BIOPSY;  Surgeon: Jinny Carmine, MD;  Location: [facility];  Service: Endoscopy;  Laterality: N/A;   ESOPHAGOGASTRODUODENOSCOPY (EGD) WITH PROPOFOL  N/A 06/20/2016   Procedure: ESOPHAGOGASTRODUODENOSCOPY (EGD) WITH PROPOFOL ;  Surgeon: Carmine Jinny, MD;  Location: [facility];  Service: Endoscopy;  Laterality: N/A;   ESOPHAGOGASTRODUODENOSCOPY (EGD) WITH PROPOFOL  N/A 11/11/2019   Procedure: ESOPHAGOGASTRODUODENOSCOPY (EGD) WITH PROPOFOL ;  Surgeon: Jinny Carmine, MD;  Location: [facility] SUR[facility]ERY [facility];  Service: Endoscopy;  Laterality: N/A;   ESOPHAGOGASTRODUODENOSCOPY (EGD) WITH PROPOFOL  N/A 03/06/2020    Procedure: ESOPHAGOGASTRODUODENOSCOPY (EGD) WITH BIOPSY;  Surgeon: Jinny Carmine, MD;  Location: [facility] SURGERY [facility];  Service: Endoscopy;  Laterality: N/A;   ESOPHAGOGASTRODUODENOSCOPY (EGD) WITH PROPOFOL  N/A 04/28/2023   Procedure: ESOPHAGOGASTRODUODENOSCOPY (EGD) WITH BIOPSY;  Surgeon: Jinny Carmine, MD;  Location: [facility];  Service: Endoscopy;  Laterality: N/A;   FOOT SURGERY Right    GANGLION CYST EXCISION Left    NASAL SINUS SURGERY     NEUROMA SURGERY Left    OPEN REDUCTION INTERNAL FIXATION (ORIF) DISTAL RADIAL FRACTURE Left 08/24/2020   Procedure: OPEN REDUCTION INTERNAL FIXATION (ORIF) DISTAL RADIAL FRACTURE;  Surgeon: Edie Norleen PARAS, MD;  Location: [facility];  Service: Orthopedics;  Laterality: Left;   OPEN REDUCTION INTERNAL FIXATION (ORIF) DISTAL RADIAL FRACTURE Right 09/13/2022   Procedure: OPEN REDUCTION INTERNAL FIXATION (ORIF) DISTAL RADIUS FRACTURE;  Surgeon: Kathlynn Sharper, MD;  Location: [facility] SUR[facility]ERY [facility];  Service: Orthopedics;  Laterality: Right;   PLACEMENT OF BREAST IMPLANTS     POLYPECTOMY  06/20/2016   Procedure: POLYPECTOMY;  Surgeon: Carmine Jinny, MD;  Location: [facility] SURGERY [facility];  Service: Endoscopy;;   POLYPECTOMY N/A 03/06/2020   Procedure: POLYPECTOMY;  Surgeon: Jinny Carmine, MD;  Location: [facility] SURGERY [facility];  Service: Endoscopy;  Laterality: N/A;   POLYPECTOMY  04/28/2023   Procedure: POLYPECTOMY;  Surgeon: Jinny Carmine, MD;  Location: [facility] SUR[facility]ERY [facility];  Service: Endoscopy;;   REPLACEMENT TOTAL KNEE Right 06/2014   TONSILLECTOMY     TOTAL VAGINAL HYSTERECTOMY  1996   cervical dysplasia    Social History   Tobacco Use   Smoking status: Never   Smokeless tobacco: Never  Vaping Use   Vaping status: Never Used  Substance Use Topics   Alcohol use: No    Alcohol/week: 0.0 standard drinks of alcohol   Drug use: No     Medication list has been reviewed and updated.  No outpatient medications have been marked as taking for the 02/20/24  encounter (Orders Only) with Justus Leita DEL, MD.       02/18/2024    9:14 AM 08/18/2023    9:26 AM 04/01/2023   10:23 AM 12/24/2022    9:39 AM  GAD 7 : Generalized Anxiety Score  Nervous, Anxious, on Edge 0 0 0 0  Control/stop worrying 0 0 0 0  Worry too much - different things 0 0 0 0  Trouble relaxing 0 0 0 0  Restless 0 0 0 0  Easily annoyed or irritable 0 0 0 0  Afraid - awful might happen 0 0 0 0  Total GAD 7 Score 0 0 0 0  Anxiety Difficulty Not difficult at all Not difficult at all Not difficult at all Not difficult at all       02/18/2024    9:14 AM 08/18/2023    9:26 AM 05/14/2023    9:06 AM  Depression screen PHQ 2/9  Decreased Interest 0 0 0  Down, Depressed, Hopeless 0 0 0  PHQ - 2 Score 0  0 0  Altered sleeping 0 0 0  Tired, decreased energy 0 0 0  Change in appetite 0 0 0  Feeling bad or failure about yourself  0 0 0  Trouble concentrating 0 0 0  Moving slowly or fidgety/restless 0 0 0  Suicidal thoughts 0 0 0  PHQ-9 Score 0 0 0  Difficult doing work/chores Not difficult at all Not difficult at all Not difficult at all    BP Readings from Last 3 Encounters:  02/18/24 112/64  08/18/23 112/70  04/28/23 100/70    Physical Exam  Wt Readings from Last 3 Encounters:  02/18/24 179 lb (81.2 kg)  08/18/23 182 lb (82.6 kg)  04/28/23 190 lb (86.2 kg)    There were no vitals taken for this visit.  Assessment and Plan:  Problem List Items Addressed This Visit   None   No follow-ups on file.    Leita HILARIO Adie, MD [facility] [place]

## 2024-02-20 NOTE — Telephone Encounter (Signed)
 Copied from CRM [id number]. Topic: Clinical - Prescription Issue >> Feb 20, 2024 10:47 AM Selinda RAMAN wrote: Reason for CRM: The patient called in asking to speak with her provider or a nurse stating there are problems with her butalbital -apap-caffeine-codeine (FIORICET WITH CODEINE) 50-325-40-30 MG capsule prescription that was received by [facility]. She says it has to read like 21 first then a 90 day supply. She would like to explain this in more detail and speak with a nurse. Please assist patient further because they told her the prescription although it shows #60 reads as negative 1

## 2024-02-24 ENCOUNTER — Other Ambulatory Visit: Payer: Self-pay | Admitting: Internal Medicine

## 2024-02-24 DIAGNOSIS — I10 Essential (primary) hypertension: Secondary | ICD-10-CM

## 2024-02-25 DIAGNOSIS — M47812 Spondylosis without myelopathy or radiculopathy, cervical region: Secondary | ICD-10-CM | POA: Diagnosis not present

## 2024-02-25 DIAGNOSIS — M1612 Unilateral primary osteoarthritis, left hip: Secondary | ICD-10-CM | POA: Diagnosis not present

## 2024-02-25 NOTE — Telephone Encounter (Signed)
 Requested Prescriptions  Pending Prescriptions Disp Refills   furosemide  (LASIX ) 40 MG tablet [Pharmacy Med Name: Furosemide  40 MG Oral Tablet] 100 tablet 2    Sig: TAKE 1 TABLET BY MOUTH DAILY     Cardiovascular:  Diuretics - Loop Failed - 02/25/2024  2:42 PM      Failed - Cl in normal range and within 180 days    Chloride  Date Value Ref Range Status  02/18/2024 94 (L) 96 - 106 mmol/L Final         Failed - Mg Level in normal range and within 180 days    No results found for: MG       Passed - K in normal range and within 180 days    Potassium  Date Value Ref Range Status  02/18/2024 4.2 3.5 - 5.2 mmol/L Final         Passed - Ca in normal range and within 180 days    Calcium  Date Value Ref Range Status  02/18/2024 9.6 8.7 - 10.3 mg/dL Final         Passed - Na in normal range and within 180 days    Sodium  Date Value Ref Range Status  02/18/2024 135 134 - 144 mmol/L Final         Passed - Cr in normal range and within 180 days    Creatinine, Ser  Date Value Ref Range Status  02/18/2024 0.83 0.57 - 1.00 mg/dL Final         Passed - Last BP in normal range    BP Readings from Last 1 Encounters:  02/18/24 112/64         Passed - Valid encounter within last 6 months    Recent Outpatient Visits           1 week ago Annual physical exam   [facility] Medicine at [facility], Leita DEL, MD   6 months ago Essential hypertension   [facility] Primary Care & Sports Medicine at [facility], Leita DEL, MD

## 2024-03-01 ENCOUNTER — Other Ambulatory Visit: Payer: Self-pay | Admitting: Internal Medicine

## 2024-03-01 DIAGNOSIS — E034 Atrophy of thyroid (acquired): Secondary | ICD-10-CM

## 2024-03-02 DIAGNOSIS — E782 Mixed hyperlipidemia: Secondary | ICD-10-CM | POA: Diagnosis not present

## 2024-03-02 DIAGNOSIS — I493 Ventricular premature depolarization: Secondary | ICD-10-CM | POA: Diagnosis not present

## 2024-03-02 DIAGNOSIS — M3500 Sicca syndrome, unspecified: Secondary | ICD-10-CM | POA: Diagnosis not present

## 2024-03-02 DIAGNOSIS — I77810 Thoracic aortic ectasia: Secondary | ICD-10-CM | POA: Diagnosis not present

## 2024-03-02 DIAGNOSIS — I10 Essential (primary) hypertension: Secondary | ICD-10-CM | POA: Diagnosis not present

## 2024-03-02 NOTE — Telephone Encounter (Signed)
 Requested medications are due for refill today.  yes  Requested medications are on the active medications list.  yes  Last refill. 12/05/2023 #100 0 rf  Future visit scheduled.   yes  Notes to clinic.  Abnormal labs.    Requested Prescriptions  Pending Prescriptions Disp Refills   levothyroxine  (SYNTHROID ) 88 MCG tablet [Pharmacy Med Name: Levothyroxine  Sodium 88 MCG Oral Tablet] 100 tablet 2    Sig: TAKE 1 TABLET BY MOUTH DAILY  BEFORE BREAKFAST     Endocrinology:  Hypothyroid Agents Failed - 03/02/2024  2:00 PM      Failed - TSH in normal range and within 360 days    TSH  Date Value Ref Range Status  02/18/2024 5.620 (H) 0.450 - 4.500 uIU/mL Final         Passed - Valid encounter within last 12 months    Recent Outpatient Visits           1 week ago Annual physical exam   [place] [facility] at [facility], Leita DEL, MD   6 months ago Essential hypertension   [place] [facility] at [facility], Leita DEL, MD              Signed Prescriptions Disp Refills   DULoxetine  (CYMBALTA ) 60 MG capsule 100 capsule 1    Sig: TAKE 1 CAPSULE BY MOUTH DAILY     Psychiatry: Antidepressants - SNRI - duloxetine  Passed - 03/02/2024  2:00 PM      Passed - Cr in normal range and within 360 days    Creatinine, Ser  Date Value Ref Range Status  02/18/2024 0.83 0.57 - 1.00 mg/dL Final         Passed - eGFR is 30 or above and within 360 days    GFR calc Af Amer  Date Value Ref Range Status  07/10/2020 76 >59 mL/min/1.73 Final    Comment:    **In accordance with recommendations from the NKF-ASN Task force,**   [facility] is in the process of updating its eGFR calculation to the   2021 CKD-EPI creatinine equation that estimates kidney function   without a race variable.    GFR, Estimated  Date Value Ref Range Status  08/24/2020 >60 >60 mL/min Final    Comment:    (NOTE) Calculated using the CKD-EPI Creatinine  Equation (2021)    eGFR  Date Value Ref Range Status  02/18/2024 79 >59 mL/min/1.73 Final         Passed - Completed PHQ-2 or PHQ-9 in the last 360 days      Passed - Last BP in normal range    BP Readings from Last 1 Encounters:  02/18/24 112/64         Passed - Valid encounter within last 6 months    Recent Outpatient Visits           1 week ago Annual physical exam   [facility] Primary Care & Sports Medicine at [facility], Leita DEL, MD   6 months ago Essential hypertension   [facility] Primary Care & Sports Medicine at [facility], Leita DEL, MD

## 2024-03-02 NOTE — Telephone Encounter (Signed)
 Requested Prescriptions  Pending Prescriptions Disp Refills   levothyroxine  (SYNTHROID ) 88 MCG tablet [Pharmacy Med Name: Levothyroxine  Sodium 88 MCG Oral Tablet] 100 tablet 2    Sig: TAKE 1 TABLET BY MOUTH DAILY  BEFORE BREAKFAST     Endocrinology:  Hypothyroid Agents Failed - 03/02/2024  2:00 PM      Failed - TSH in normal range and within 360 days    TSH  Date Value Ref Range Status  02/18/2024 5.620 (H) 0.450 - 4.500 uIU/mL Final         Passed - Valid encounter within last 12 months    Recent Outpatient Visits           1 week ago Annual physical exam   [place] [facility] at [facility], Leita DEL, MD   6 months ago Essential hypertension   [place] [facility] at [facility], Leita DEL, MD               DULoxetine  (CYMBALTA ) 60 MG capsule [Pharmacy Med Name: DULoxetine  HCl 60 MG Oral Capsule Delayed Release Particles] 100 capsule 1    Sig: TAKE 1 CAPSULE BY MOUTH DAILY     Psychiatry: Antidepressants - SNRI - duloxetine  Passed - 03/02/2024  2:00 PM      Passed - Cr in normal range and within 360 days    Creatinine, Ser  Date Value Ref Range Status  02/18/2024 0.83 0.57 - 1.00 mg/dL Final         Passed - eGFR is 30 or above and within 360 days    GFR calc Af Amer  Date Value Ref Range Status  07/10/2020 76 >59 mL/min/1.73 Final    Comment:    **In accordance with recommendations from the NKF-ASN Task force,**   [facility] is in the process of updating its eGFR calculation to the   2021 CKD-EPI creatinine equation that estimates kidney function   without a race variable.    GFR, Estimated  Date Value Ref Range Status  08/24/2020 >60 >60 mL/min Final    Comment:    (NOTE) Calculated using the CKD-EPI Creatinine Equation (2021)    eGFR  Date Value Ref Range Status  02/18/2024 79 >59 mL/min/1.73 Final         Passed - Completed PHQ-2 or PHQ-9 in the last 360 days      Passed - Last BP  in normal range    BP Readings from Last 1 Encounters:  02/18/24 112/64         Passed - Valid encounter within last 6 months    Recent Outpatient Visits           1 week ago Annual physical exam   [facility] Primary Care & Sports Medicine at [facility], Leita DEL, MD   6 months ago Essential hypertension   [facility] Primary Care & Sports Medicine at [facility], Leita DEL, MD

## 2024-03-04 DIAGNOSIS — M1612 Unilateral primary osteoarthritis, left hip: Secondary | ICD-10-CM | POA: Diagnosis not present

## 2024-03-08 ENCOUNTER — Telehealth: Payer: Self-pay

## 2024-03-08 ENCOUNTER — Other Ambulatory Visit: Payer: Self-pay | Admitting: Internal Medicine

## 2024-03-08 DIAGNOSIS — G43009 Migraine without aura, not intractable, without status migrainosus: Secondary | ICD-10-CM

## 2024-03-08 MED ORDER — BUTALBITAL-APAP-CAFF-COD 50-325-40-30 MG PO CAPS: 1.0000 | ORAL_CAPSULE | Freq: Four times a day (QID) | ORAL | 0 refills | Status: DC | PRN

## 2024-03-08 NOTE — Progress Notes (Unsigned)
 Date:  03/08/2024   Name:  Ashley Glover   DOB:  1960-07-31   MRN:  [id number]   Chief Complaint: No chief complaint on file.  HPI  Review of Systems   Lab Results  Component Value Date   NA 135 02/18/2024   K 4.2 02/18/2024   CO2 24 02/18/2024   GLUCOSE 98 02/18/2024   BUN 16 02/18/2024   CREATININE 0.83 02/18/2024   CALCIUM 9.6 02/18/2024   EGFR 79 02/18/2024   GFRNONAA >60 08/24/2020   Lab Results  Component Value Date   CHOL 328 (H) 02/18/2024   HDL 54 02/18/2024   LDLCALC 223 (H) 02/18/2024   TRIG 253 (H) 02/18/2024   CHOLHDL 6.1 (H) 02/18/2024   Lab Results  Component Value Date   TSH 5.620 (H) 02/18/2024   Lab Results  Component Value Date   HGBA1C 5.8 (H) 02/18/2024   Lab Results  Component Value Date   WBC 11.1 (H) 02/18/2024   HGB 14.7 02/18/2024   HCT 46.7 (H) 02/18/2024   MCV 90 02/18/2024   PLT 310 02/18/2024   Lab Results  Component Value Date   ALT 25 02/18/2024   AST 28 02/18/2024   ALKPHOS 126 02/18/2024   BILITOT 0.3 02/18/2024   Lab Results  Component Value Date   VD25OH 57.3 12/24/2022     Patient Active Problem List   Diagnosis Date Noted   Spinal stenosis of lumbar region without neurogenic claudication 02/18/2024   Status post revision of total knee replacement, right 10/03/2023   S/P breast biopsy 09/11/2023   Polyp of ascending colon 04/28/2023   Prediabetes 03/01/2022   Aortic root dilatation 01/16/2022   S/P total hysterectomy 03/16/2020   Dermatitis of both ear canals 03/01/2020   Pruritus 01/25/2020   Insomnia 06/25/2019   Osteopenia determined by x-ray 06/23/2018   Migraine without aura and without status migrainosus, not intractable 05/21/2017   Not currently working due to disabled status 01/24/2017   Frequent PVCs 08/20/2016   Periodic headache syndrome, not intractable 01/31/2016   Rosacea, acne 01/31/2016   Essential hypertension 08/02/2015   Hypothyroidism due to acquired atrophy of thyroid   08/02/2015   Fibromyalgia 08/02/2015   Sjogren's syndrome 08/02/2015   Peptic ulcer disease 08/02/2015   Other allergic rhinitis 08/02/2015   Hyperlipidemia, mixed 08/02/2015   Primary osteoarthritis of left knee 08/02/2015    Allergies  Allergen Reactions   Ace Inhibitors Cough   Ketamine      Pt did  not like the way it made her feel   Morphine And Codeine Nausea Only   Savella [Milnacipran Hcl] Palpitations    Past Surgical History:  Procedure Laterality Date   AUGMENTATION MAMMAPLASTY Bilateral 1987   BREAST BIOPSY Right 09/10/2023   MM RT BREAST BX W LOC DEV 1ST LESION IMAGE BX SPEC STEREO GUIDE 09/10/2023 [facility]   COLONOSCOPY  05/2012   polyps   COLONOSCOPY WITH PROPOFOL  N/A 06/20/2016   Procedure: COLONOSCOPY WITH PROPOFOL ;  Surgeon: Rogelia Copping, MD;  Location: [facility];  Service: Endoscopy;  Laterality: N/A;   COLONOSCOPY WITH PROPOFOL  N/A 11/11/2019   Procedure: COLONOSCOPY WITH PROPOFOL ;  Surgeon: Copping Rogelia, MD;  Location: [facility];  Service: Endoscopy;  Laterality: N/A;  3   COLONOSCOPY WITH PROPOFOL  N/A 03/06/2020   Procedure: COLONOSCOPY WITH BIOPSY;  Surgeon: Copping Rogelia, MD;  Location: [facility];  Service: Endoscopy;  Laterality: N/A;   COLONOSCOPY WITH PROPOFOL  N/A 04/28/2023   Procedure: COLONOSCOPY WITH  BIOPSY;  Surgeon: Jinny Carmine, MD;  Location: [facility];  Service: Endoscopy;  Laterality: N/A;   ESOPHAGOGASTRODUODENOSCOPY (EGD) WITH PROPOFOL  N/A 06/20/2016   Procedure: ESOPHAGOGASTRODUODENOSCOPY (EGD) WITH PROPOFOL ;  Surgeon: Carmine Jinny, MD;  Location: [facility];  Service: Endoscopy;  Laterality: N/A;   ESOPHAGOGASTRODUODENOSCOPY (EGD) WITH PROPOFOL  N/A 11/11/2019   Procedure: ESOPHAGOGASTRODUODENOSCOPY (EGD) WITH PROPOFOL ;  Surgeon: Jinny Carmine, MD;  Location: [facility];  Service: Endoscopy;  Laterality: N/A;   ESOPHAGOGASTRODUODENOSCOPY (EGD) WITH PROPOFOL  N/A 03/06/2020    Procedure: ESOPHAGOGASTRODUODENOSCOPY (EGD) WITH BIOPSY;  Surgeon: Jinny Carmine, MD;  Location: [facility];  Service: Endoscopy;  Laterality: N/A;   ESOPHAGOGASTRODUODENOSCOPY (EGD) WITH PROPOFOL  N/A 04/28/2023   Procedure: ESOPHAGOGASTRODUODENOSCOPY (EGD) WITH BIOPSY;  Surgeon: Jinny Carmine, MD;  Location: [facility];  Service: Endoscopy;  Laterality: N/A;   FOOT SURGERY Right    GANGLION CYST EXCISION Left    NASAL SINUS SURGERY     NEUROMA SURGERY Left    OPEN REDUCTION INTERNAL FIXATION (ORIF) DISTAL RADIAL FRACTURE Left 08/24/2020   Procedure: OPEN REDUCTION INTERNAL FIXATION (ORIF) DISTAL RADIAL FRACTURE;  Surgeon: Edie Norleen PARAS, MD;  Location: [facility];  Service: Orthopedics;  Laterality: Left;   OPEN REDUCTION INTERNAL FIXATION (ORIF) DISTAL RADIAL FRACTURE Right 09/13/2022   Procedure: OPEN REDUCTION INTERNAL FIXATION (ORIF) DISTAL RADIUS FRACTURE;  Surgeon: Kathlynn Sharper, MD;  Location: [facility]GERY [facility];  Service: Orthopedics;  Laterality: Right;   PLACEMENT OF BREAST IMPLANTS     POLYPECTOMY  06/20/2016   Procedure: POLYPECTOMY;  Surgeon: Carmine Jinny, MD;  Location: [facility] SURG[facility];  Service: Endoscopy;;   POLYPECTOMY N/A 03/06/2020   Procedure: POLYPECTOMY;  Surgeon: Jinny Carmine, MD;  Location: [facility];  Service: Endoscopy;  Laterality: N/A;   POLYPECTOMY  04/28/2023   Procedure: POLYPECTOMY;  Surgeon: Jinny Carmine, MD;  Location: [facility];  Service: Endoscopy;;   REPLACEMENT TOTAL KNEE Right 06/2014   TONSILLECTOMY     TOTAL VAGINAL HYSTERECTOMY  1996   cervical dysplasia    Social History   Tobacco Use   Smoking status: Never   Smokeless tobacco: Never  Vaping Use   Vaping status: Never Used  Substance Use Topics   Alcohol use: No    Alcohol/week: 0.0 standard drinks of alcohol   Drug use: No     Medication list has been reviewed and updated.  No outpatient medications have been marked as taking for the 03/08/24  encounter (Orders Only) with Justus Leita DEL, MD.       02/18/2024    9:14 AM 08/18/2023    9:26 AM 04/01/2023   10:23 AM 12/24/2022    9:39 AM  GAD 7 : Generalized Anxiety Score  Nervous, Anxious, on Edge 0 0 0 0  Control/stop worrying 0 0 0 0  Worry too much - different things 0 0 0 0  Trouble relaxing 0 0 0 0  Restless 0 0 0 0  Easily annoyed or irritable 0 0 0 0  Afraid - awful might happen 0 0 0 0  Total GAD 7 Score 0 0 0 0  Anxiety Difficulty Not difficult at all Not difficult at all Not difficult at all Not difficult at all       02/18/2024    9:14 AM 08/18/2023    9:26 AM 05/14/2023    9:06 AM  Depression screen PHQ 2/9  Decreased Interest 0 0 0  Down, Depressed, Hopeless 0 0 0  PHQ - 2 Score 0  0 0  Altered sleeping 0 0 0  Tired, decreased energy 0 0 0  Change in appetite 0 0 0  Feeling bad or failure about yourself  0 0 0  Trouble concentrating 0 0 0  Moving slowly or fidgety/restless 0 0 0  Suicidal thoughts 0 0 0  PHQ-9 Score 0 0 0  Difficult doing work/chores Not difficult at all Not difficult at all Not difficult at all    BP Readings from Last 3 Encounters:  02/18/24 112/64  08/18/23 112/70  04/28/23 100/70    Physical Exam  Wt Readings from Last 3 Encounters:  02/18/24 179 lb (81.2 kg)  08/18/23 182 lb (82.6 kg)  04/28/23 190 lb (86.2 kg)    There were no vitals taken for this visit.  Assessment and Plan:  Problem List Items Addressed This Visit   None   No follow-ups on file.    Leita HILARIO Adie, MD [facility] Primary [facility] Medicine [place]

## 2024-03-08 NOTE — Telephone Encounter (Signed)
 Copied from CRM #[id number]. Topic: Clinical - Medication Question >> Mar 08, 2024  1:53 PM Ashley Glover wrote: Reason for CRM: Patient calling, requesitng a return all regarding Fioricet medication.  Patient requesting a return call to discuss further, states she have been previously speaking with [facility] regarding this.   CB# [phone]

## 2024-03-09 ENCOUNTER — Other Ambulatory Visit: Payer: Self-pay | Admitting: Family Medicine

## 2024-03-09 DIAGNOSIS — S12590A Other displaced fracture of sixth cervical vertebra, initial encounter for closed fracture: Secondary | ICD-10-CM

## 2024-04-12 ENCOUNTER — Other Ambulatory Visit: Payer: Self-pay | Admitting: Family Medicine

## 2024-04-12 DIAGNOSIS — S12590A Other displaced fracture of sixth cervical vertebra, initial encounter for closed fracture: Secondary | ICD-10-CM

## 2024-04-14 ENCOUNTER — Other Ambulatory Visit

## 2024-04-14 ENCOUNTER — Ambulatory Visit
Admission: RE | Admit: 2024-04-14 | Discharge: 2024-04-14 | Disposition: A | Discharge status: Home / Self Care | Source: Ambulatory Visit | Attending: Family Medicine | Admitting: Family Medicine

## 2024-04-14 DIAGNOSIS — S12590A Other displaced fracture of sixth cervical vertebra, initial encounter for closed fracture: Secondary | ICD-10-CM | POA: Insufficient documentation

## 2024-04-20 ENCOUNTER — Other Ambulatory Visit: Payer: Self-pay | Admitting: Internal Medicine

## 2024-04-20 DIAGNOSIS — M48061 Spinal stenosis, lumbar region without neurogenic claudication: Secondary | ICD-10-CM

## 2024-04-21 NOTE — Telephone Encounter (Signed)
 Requested Prescriptions  Pending Prescriptions Disp Refills   diclofenac  (VOLTAREN ) 50 MG EC tablet [Pharmacy Med Name: DICLOFENAC  SODIUM  50MG   TAB  ENTERIC COATED] 180 tablet 0    Sig: TAKE 1 TABLET BY MOUTH TWICE  DAILY     Analgesics:  NSAIDS Failed - 04/21/2024  9:53 AM      Failed - Manual Review: Labs are only required if the patient has taken medication for more than 8 weeks.      Failed - HCT in normal range and within 360 days    Hematocrit  Date Value Ref Range Status  02/18/2024 46.7 (H) 34.0 - 46.6 % Final         Passed - Cr in normal range and within 360 days    Creatinine, Ser  Date Value Ref Range Status  02/18/2024 0.83 0.57 - 1.00 mg/dL Final         Passed - HGB in normal range and within 360 days    Hemoglobin  Date Value Ref Range Status  02/18/2024 14.7 11.1 - 15.9 g/dL Final         Passed - PLT in normal range and within 360 days    Platelets  Date Value Ref Range Status  02/18/2024 310 150 - 450 x10E3/uL Final         Passed - eGFR is 30 or above and within 360 days    GFR calc Af Amer  Date Value Ref Range Status  07/10/2020 76 >59 mL/min/1.73 Final    Comment:    **In accordance with recommendations from the NKF-ASN Task force,**   [facility] is in the process of updating its eGFR calculation to the   2021 CKD-EPI creatinine equation that estimates kidney function   without a race variable.    GFR, Estimated  Date Value Ref Range Status  08/24/2020 >60 >60 mL/min Final    Comment:    (NOTE) Calculated using the CKD-EPI Creatinine Equation (2021)    eGFR  Date Value Ref Range Status  02/18/2024 79 >59 mL/min/1.73 Final         Passed - Patient is not pregnant      Passed - Valid encounter within last 12 months    Recent Outpatient Visits           2 months ago Annual physical exam   [facility] at [facility], Leita DEL, MD   8 months ago Essential hypertension   [facility]  Care & Sports Medicine at [facility], Leita DEL, MD

## 2024-04-27 ENCOUNTER — Other Ambulatory Visit: Payer: Self-pay | Admitting: Internal Medicine

## 2024-04-27 DIAGNOSIS — I10 Essential (primary) hypertension: Secondary | ICD-10-CM

## 2024-04-27 DIAGNOSIS — M797 Fibromyalgia: Secondary | ICD-10-CM

## 2024-04-29 NOTE — Telephone Encounter (Signed)
 Requested Prescriptions  Pending Prescriptions Disp Refills   gabapentin  (NEURONTIN ) 600 MG tablet [Pharmacy Med Name: Gabapentin  600 MG Oral Tablet] 300 tablet 1    Sig: TAKE 1 TABLET BY MOUTH 3 TIMES  DAILY     Neurology: Anticonvulsants - gabapentin  Passed - 04/29/2024  4:13 PM      Passed - Cr in normal range and within 360 days    Creatinine, Ser  Date Value Ref Range Status  02/18/2024 0.83 0.57 - 1.00 mg/dL Final         Passed - Completed PHQ-2 or PHQ-9 in the last 360 days      Passed - Valid encounter within last 12 months    Recent Outpatient Visits           2 months ago Annual physical exam   [place] [facility] at [facility], Leita DEL, MD   8 months ago Essential hypertension   [place] [facility] at [facility], Leita DEL, MD               propranolol  (INDERAL ) 40 MG tablet [Pharmacy Med Name: PROPRANOLOL   40MG   TAB] 200 tablet 1    Sig: TAKE 1 TABLET BY MOUTH TWICE  DAILY     Cardiovascular:  Beta Blockers Passed - 04/29/2024  4:13 PM      Passed - Last BP in normal range    BP Readings from Last 1 Encounters:  02/18/24 112/64         Passed - Last Heart Rate in normal range    Pulse Readings from Last 1 Encounters:  02/18/24 73         Passed - Valid encounter within last 6 months    Recent Outpatient Visits           2 months ago Annual physical exam   [facility] Medicine at [facility], Leita DEL, MD   8 months ago Essential hypertension   [facility] Care & Sports Medicine at [facility], Leita DEL, MD

## 2024-05-03 ENCOUNTER — Other Ambulatory Visit: Payer: Self-pay | Admitting: Internal Medicine

## 2024-05-03 DIAGNOSIS — M797 Fibromyalgia: Secondary | ICD-10-CM

## 2024-05-03 DIAGNOSIS — G43009 Migraine without aura, not intractable, without status migrainosus: Secondary | ICD-10-CM

## 2024-05-05 ENCOUNTER — Telehealth: Payer: Self-pay

## 2024-05-05 NOTE — Telephone Encounter (Signed)
 Requested medication (s) are due for refill today: Yes  Requested medication (s) are on the active medication list: Yes  Last refill:    Future visit scheduled: Yes  Notes to clinic:  Not delegated.    Requested Prescriptions  Pending Prescriptions Disp Refills   butalbital -apap-caffeine-codeine (FIORICET WITH CODEINE) 50-325-40-30 MG capsule [Pharmacy Med Name: Butalbital -APAP-Caff-Cod 50-325-40-30 MG Oral Capsule] 42 capsule 0    Sig: TAKE 1 TO 2 CAPSULES BY MOUTH EVERY 6 HOURS AS NEEDED FOR HEADACHE . DO NOT EXCEED 6 PER 24 HOURS     Not Delegated - Analgesics:  Opioid Agonist Combinations 2 Failed - 05/05/2024  2:31 PM      Failed - This refill cannot be delegated      Failed - Urine Drug Screen completed in last 360 days      Passed - Cr in normal range and within 360 days    Creatinine, Ser  Date Value Ref Range Status  02/18/2024 0.83 0.57 - 1.00 mg/dL Final         Passed - eGFR is 10 or above and within 360 days    GFR calc Af Amer  Date Value Ref Range Status  07/10/2020 76 >59 mL/min/1.73 Final    Comment:    **In accordance with recommendations from the NKF-ASN Task force,**   [facility] is in the process of updating its eGFR calculation to the   2021 CKD-EPI creatinine equation that estimates kidney function   without a race variable.    GFR, Estimated  Date Value Ref Range Status  08/24/2020 >60 >60 mL/min Final    Comment:    (NOTE) Calculated using the CKD-EPI Creatinine Equation (2021)    eGFR  Date Value Ref Range Status  02/18/2024 79 >59 mL/min/1.73 Final         Passed - Patient is not pregnant      Passed - Valid encounter within last 3 months    Recent Outpatient Visits           2 months ago Annual physical exam   [place] [facility] at [facility], Leita DEL, MD   8 months ago Essential hypertension   [place] [facility] at [facility], Leita DEL, MD                promethazine  (PHENERGAN ) 25 MG tablet [Pharmacy Med Name: Promethazine  HCl 25 MG Oral Tablet] 60 tablet 0    Sig: TAKE 1 TABLET BY MOUTH EVERY 8 HOURS AS NEEDED FOR NAUSEA FOR VOMITING     Not Delegated - Gastroenterology: Antiemetics Failed - 05/05/2024  2:31 PM      Failed - This refill cannot be delegated      Passed - Valid encounter within last 6 months    Recent Outpatient Visits           2 months ago Annual physical exam   [facility] at [facility], Leita DEL, MD   8 months ago Essential hypertension   [facility] Medicine at [facility], Leita DEL, MD

## 2024-05-05 NOTE — Telephone Encounter (Signed)
 Please review.  KP

## 2024-05-05 NOTE — Telephone Encounter (Signed)
 Requested medication (s) are due for refill today - yes  Requested medication (s) are on the active medication list -yes  Future visit scheduled -yes  Last refill: hydrocortisone - 12/05/23 90g                 Tizanidine - 12/05/23 #300  Notes to clinic: non delegated Rx  Requested Prescriptions  Pending Prescriptions Disp Refills   hydrocortisone  2.5 % cream [Pharmacy Med Name: HYDROCORTISONE   2.5%  CRE] 90 g 0    Sig: APPLY TOPICALLY TO AFFECTED  AREA(S) TWICE DAILY     Off-Protocol Failed - 05/05/2024  2:15 PM      Failed - Medication not assigned to a protocol, review manually.      Passed - Valid encounter within last 12 months    Recent Outpatient Visits           2 months ago Annual physical exam   [place] [facility] at [facility], Leita DEL, MD   8 months ago Essential hypertension   [place] [facility] at [facility], Leita DEL, MD               tiZANidine  (ZANAFLEX ) 4 MG tablet [Pharmacy Med Name: tiZANidine  HCl 4 MG Oral Tablet] 300 tablet 0    Sig: TAKE 1 TABLET BY MOUTH 3 TIMES  DAILY     Not Delegated - Cardiovascular:  Alpha-2 Agonists - tizanidine  Failed - 05/05/2024  2:15 PM      Failed - This refill cannot be delegated      Passed - Valid encounter within last 6 months    Recent Outpatient Visits           2 months ago Annual physical exam   [place] [facility] at [facility], Leita DEL, MD   8 months ago Essential hypertension   [place] [facility] at [facility], Leita DEL, MD                 Requested Prescriptions  Pending Prescriptions Disp Refills   hydrocortisone  2.5 % cream [Pharmacy Med Name: HYDROCORTISONE   2.5%  CRE] 90 g 0    Sig: APPLY TOPICALLY TO AFFECTED  AREA(S) TWICE DAILY     Off-Protocol Failed - 05/05/2024  2:15 PM      Failed - Medication not assigned to a protocol, review  manually.      Passed - Valid encounter within last 12 months    Recent Outpatient Visits           2 months ago Annual physical exam   [place] [facility] at [facility], Leita DEL, MD   8 months ago Essential hypertension   [place] [facility] at [facility], Leita DEL, MD               tiZANidine  (ZANAFLEX ) 4 MG tablet [Pharmacy Med Name: tiZANidine  HCl 4 MG Oral Tablet] 300 tablet 0    Sig: TAKE 1 TABLET BY MOUTH 3 TIMES  DAILY     Not Delegated - Cardiovascular:  Alpha-2 Agonists - tizanidine  Failed - 05/05/2024  2:15 PM      Failed - This refill cannot be delegated      Passed - Valid encounter within last 6 months    Recent Outpatient Visits  2 months ago Annual physical exam   [facility] Medicine at [facility], Leita DEL, MD   8 months ago Essential hypertension   [facility] Medicine at [facility], Leita DEL, MD

## 2024-05-05 NOTE — Telephone Encounter (Signed)
° °  Tried to call patient and ask what medication did she need refilled as Dr. Justus sent in some medication today. Left a VM for patient to call back.  JM  Copied from CRM #[id number]. Topic: General - Other >> May 05, 2024  2:56 PM Joesph B wrote: Reason for CRM: patient following up on refill request , says she needs it by tomorrow morning because her car will be in the shop that afternoon.

## 2024-07-01 ENCOUNTER — Telehealth: Payer: Self-pay | Admitting: Internal Medicine

## 2024-07-01 NOTE — Telephone Encounter (Signed)
 Reached out to patient to get her rescheduled due to provider being out of office. Appt got rescheduled on 09/22/24. Pt stated she will need her medication before then, and that she has an surgery consultation at the end of March so she can have surgery done in April.  Pt stated that if for any reason she can not make appt due to surgery, she will call and reschedule.

## 2024-07-02 ENCOUNTER — Other Ambulatory Visit: Payer: Self-pay

## 2024-07-02 DIAGNOSIS — M8580 Other specified disorders of bone density and structure, unspecified site: Secondary | ICD-10-CM

## 2024-07-02 DIAGNOSIS — M8088XD Other osteoporosis with current pathological fracture, vertebra(e), subsequent encounter for fracture with routine healing: Secondary | ICD-10-CM

## 2024-07-02 MED ORDER — ALENDRONATE SODIUM 70 MG PO TABS: 70.0000 mg | ORAL_TABLET | ORAL | 0 refills | Status: AC

## 2024-08-17 ENCOUNTER — Encounter: Admitting: Student

## 2024-09-22 ENCOUNTER — Encounter: Admitting: Student
